# Patient Record
Sex: Female | Born: 1935 | Race: White | Hispanic: No | Smoking: Never smoker
Health system: Southern US, Community
[De-identification: ages and names within clinical notes are randomized; demographics above are authoritative.]

## PROBLEM LIST (undated history)

## (undated) DIAGNOSIS — I350 Nonrheumatic aortic (valve) stenosis: Secondary | ICD-10-CM

## (undated) DIAGNOSIS — N185 Chronic kidney disease, stage 5: Secondary | ICD-10-CM

## (undated) DIAGNOSIS — J449 Chronic obstructive pulmonary disease, unspecified: Secondary | ICD-10-CM

## (undated) DIAGNOSIS — J441 Chronic obstructive pulmonary disease with (acute) exacerbation: Secondary | ICD-10-CM

## (undated) DIAGNOSIS — R011 Cardiac murmur, unspecified: Secondary | ICD-10-CM

## (undated) DIAGNOSIS — I5031 Acute diastolic (congestive) heart failure: Secondary | ICD-10-CM

## (undated) DIAGNOSIS — E669 Obesity, unspecified: Secondary | ICD-10-CM

## (undated) DIAGNOSIS — I10 Essential (primary) hypertension: Secondary | ICD-10-CM

## (undated) DIAGNOSIS — G4733 Obstructive sleep apnea (adult) (pediatric): Secondary | ICD-10-CM

## (undated) DIAGNOSIS — I5021 Acute systolic (congestive) heart failure: Secondary | ICD-10-CM

## (undated) DIAGNOSIS — R0600 Dyspnea, unspecified: Secondary | ICD-10-CM

## (undated) DIAGNOSIS — E8770 Fluid overload, unspecified: Secondary | ICD-10-CM

## (undated) DIAGNOSIS — E119 Type 2 diabetes mellitus without complications: Secondary | ICD-10-CM

## (undated) DIAGNOSIS — I509 Heart failure, unspecified: Secondary | ICD-10-CM

## (undated) DIAGNOSIS — E785 Hyperlipidemia, unspecified: Secondary | ICD-10-CM

## (undated) HISTORY — DX: Acute systolic (congestive) heart failure: I50.21

## (undated) HISTORY — DX: Acute diastolic (congestive) heart failure: I50.31

## (undated) HISTORY — DX: Obesity, unspecified: E66.9

## (undated) HISTORY — DX: Heart failure, unspecified: I50.9

## (undated) HISTORY — DX: Chronic kidney disease, stage 5: N18.5

## (undated) HISTORY — PX: EYE SURGERY: SHX253

## (undated) HISTORY — DX: Hyperlipidemia, unspecified: E78.5

## (undated) HISTORY — DX: Chronic obstructive pulmonary disease with (acute) exacerbation: J44.1

## (undated) HISTORY — DX: Type 2 diabetes mellitus without complications: E11.9

## (undated) HISTORY — DX: Dyspnea, unspecified: R06.00

## (undated) HISTORY — PX: CATARACT EXTRACTION: SUR2

## (undated) HISTORY — DX: Fluid overload, unspecified: E87.70

## (undated) HISTORY — DX: Essential (primary) hypertension: I10

## (undated) HISTORY — DX: Obstructive sleep apnea (adult) (pediatric): G47.33

## (undated) HISTORY — DX: Chronic obstructive pulmonary disease, unspecified: J44.9

## (undated) HISTORY — PX: TONSILLECTOMY: SUR1361

## (undated) HISTORY — DX: Nonrheumatic aortic (valve) stenosis: I35.0

---

## 2013-12-29 DIAGNOSIS — G4733 Obstructive sleep apnea (adult) (pediatric): Secondary | ICD-10-CM | POA: Diagnosis not present

## 2014-02-17 DIAGNOSIS — J449 Chronic obstructive pulmonary disease, unspecified: Secondary | ICD-10-CM | POA: Diagnosis not present

## 2014-02-17 DIAGNOSIS — I10 Essential (primary) hypertension: Secondary | ICD-10-CM | POA: Diagnosis not present

## 2014-02-17 DIAGNOSIS — R5383 Other fatigue: Secondary | ICD-10-CM | POA: Diagnosis not present

## 2014-02-17 DIAGNOSIS — E785 Hyperlipidemia, unspecified: Secondary | ICD-10-CM | POA: Diagnosis not present

## 2014-02-17 DIAGNOSIS — Z79899 Other long term (current) drug therapy: Secondary | ICD-10-CM | POA: Diagnosis not present

## 2014-02-17 DIAGNOSIS — E119 Type 2 diabetes mellitus without complications: Secondary | ICD-10-CM | POA: Diagnosis not present

## 2014-02-17 DIAGNOSIS — R5381 Other malaise: Secondary | ICD-10-CM | POA: Diagnosis not present

## 2014-02-27 DIAGNOSIS — R197 Diarrhea, unspecified: Secondary | ICD-10-CM | POA: Diagnosis not present

## 2014-03-03 DIAGNOSIS — Z794 Long term (current) use of insulin: Secondary | ICD-10-CM | POA: Diagnosis not present

## 2014-03-03 DIAGNOSIS — N2889 Other specified disorders of kidney and ureter: Secondary | ICD-10-CM | POA: Diagnosis not present

## 2014-03-03 DIAGNOSIS — Z7982 Long term (current) use of aspirin: Secondary | ICD-10-CM | POA: Diagnosis not present

## 2014-03-03 DIAGNOSIS — I10 Essential (primary) hypertension: Secondary | ICD-10-CM | POA: Diagnosis not present

## 2014-03-03 DIAGNOSIS — R197 Diarrhea, unspecified: Secondary | ICD-10-CM | POA: Diagnosis not present

## 2014-03-03 DIAGNOSIS — K828 Other specified diseases of gallbladder: Secondary | ICD-10-CM | POA: Diagnosis not present

## 2014-03-03 DIAGNOSIS — E119 Type 2 diabetes mellitus without complications: Secondary | ICD-10-CM | POA: Diagnosis not present

## 2014-03-03 DIAGNOSIS — K5730 Diverticulosis of large intestine without perforation or abscess without bleeding: Secondary | ICD-10-CM | POA: Diagnosis not present

## 2014-03-06 DIAGNOSIS — N184 Chronic kidney disease, stage 4 (severe): Secondary | ICD-10-CM | POA: Diagnosis not present

## 2014-03-06 DIAGNOSIS — J45909 Unspecified asthma, uncomplicated: Secondary | ICD-10-CM | POA: Diagnosis not present

## 2014-03-06 DIAGNOSIS — R197 Diarrhea, unspecified: Secondary | ICD-10-CM | POA: Diagnosis not present

## 2014-03-06 DIAGNOSIS — E119 Type 2 diabetes mellitus without complications: Secondary | ICD-10-CM | POA: Diagnosis not present

## 2014-03-09 DIAGNOSIS — R197 Diarrhea, unspecified: Secondary | ICD-10-CM | POA: Diagnosis not present

## 2014-03-10 DIAGNOSIS — R197 Diarrhea, unspecified: Secondary | ICD-10-CM | POA: Diagnosis not present

## 2014-04-14 DIAGNOSIS — I509 Heart failure, unspecified: Secondary | ICD-10-CM | POA: Diagnosis not present

## 2014-08-18 DIAGNOSIS — J449 Chronic obstructive pulmonary disease, unspecified: Secondary | ICD-10-CM | POA: Diagnosis not present

## 2014-08-18 DIAGNOSIS — I10 Essential (primary) hypertension: Secondary | ICD-10-CM | POA: Diagnosis not present

## 2014-08-18 DIAGNOSIS — R197 Diarrhea, unspecified: Secondary | ICD-10-CM | POA: Diagnosis not present

## 2014-08-18 DIAGNOSIS — E119 Type 2 diabetes mellitus without complications: Secondary | ICD-10-CM | POA: Diagnosis not present

## 2014-08-18 DIAGNOSIS — E785 Hyperlipidemia, unspecified: Secondary | ICD-10-CM | POA: Diagnosis not present

## 2014-08-22 DIAGNOSIS — R5381 Other malaise: Secondary | ICD-10-CM | POA: Diagnosis not present

## 2014-08-22 DIAGNOSIS — R5383 Other fatigue: Secondary | ICD-10-CM | POA: Diagnosis not present

## 2014-08-22 DIAGNOSIS — E119 Type 2 diabetes mellitus without complications: Secondary | ICD-10-CM | POA: Diagnosis not present

## 2014-08-22 DIAGNOSIS — I10 Essential (primary) hypertension: Secondary | ICD-10-CM | POA: Diagnosis not present

## 2014-08-22 DIAGNOSIS — E785 Hyperlipidemia, unspecified: Secondary | ICD-10-CM | POA: Diagnosis not present

## 2015-02-27 DIAGNOSIS — G4733 Obstructive sleep apnea (adult) (pediatric): Secondary | ICD-10-CM | POA: Diagnosis not present

## 2015-03-15 DIAGNOSIS — I509 Heart failure, unspecified: Secondary | ICD-10-CM | POA: Diagnosis not present

## 2015-03-15 DIAGNOSIS — R531 Weakness: Secondary | ICD-10-CM | POA: Diagnosis not present

## 2015-03-15 DIAGNOSIS — I10 Essential (primary) hypertension: Secondary | ICD-10-CM | POA: Diagnosis not present

## 2015-03-15 DIAGNOSIS — R0602 Shortness of breath: Secondary | ICD-10-CM | POA: Diagnosis not present

## 2015-03-15 DIAGNOSIS — J40 Bronchitis, not specified as acute or chronic: Secondary | ICD-10-CM | POA: Diagnosis not present

## 2015-03-20 DIAGNOSIS — I10 Essential (primary) hypertension: Secondary | ICD-10-CM | POA: Diagnosis not present

## 2015-03-20 DIAGNOSIS — J209 Acute bronchitis, unspecified: Secondary | ICD-10-CM | POA: Diagnosis not present

## 2015-03-20 DIAGNOSIS — E119 Type 2 diabetes mellitus without complications: Secondary | ICD-10-CM | POA: Diagnosis not present

## 2015-03-20 DIAGNOSIS — I359 Nonrheumatic aortic valve disorder, unspecified: Secondary | ICD-10-CM | POA: Diagnosis not present

## 2015-03-20 DIAGNOSIS — I509 Heart failure, unspecified: Secondary | ICD-10-CM | POA: Diagnosis not present

## 2015-03-21 DIAGNOSIS — I10 Essential (primary) hypertension: Secondary | ICD-10-CM | POA: Diagnosis not present

## 2015-03-21 DIAGNOSIS — I359 Nonrheumatic aortic valve disorder, unspecified: Secondary | ICD-10-CM | POA: Diagnosis not present

## 2015-04-16 DIAGNOSIS — I349 Nonrheumatic mitral valve disorder, unspecified: Secondary | ICD-10-CM | POA: Diagnosis not present

## 2015-04-16 DIAGNOSIS — I359 Nonrheumatic aortic valve disorder, unspecified: Secondary | ICD-10-CM | POA: Diagnosis not present

## 2015-04-16 DIAGNOSIS — I5032 Chronic diastolic (congestive) heart failure: Secondary | ICD-10-CM | POA: Diagnosis not present

## 2015-04-16 DIAGNOSIS — I10 Essential (primary) hypertension: Secondary | ICD-10-CM | POA: Diagnosis not present

## 2015-04-16 DIAGNOSIS — Z7722 Contact with and (suspected) exposure to environmental tobacco smoke (acute) (chronic): Secondary | ICD-10-CM | POA: Diagnosis not present

## 2015-04-20 DIAGNOSIS — J45998 Other asthma: Secondary | ICD-10-CM | POA: Diagnosis not present

## 2015-04-20 DIAGNOSIS — E119 Type 2 diabetes mellitus without complications: Secondary | ICD-10-CM | POA: Diagnosis not present

## 2015-04-20 DIAGNOSIS — E784 Other hyperlipidemia: Secondary | ICD-10-CM | POA: Diagnosis not present

## 2015-04-20 DIAGNOSIS — I10 Essential (primary) hypertension: Secondary | ICD-10-CM | POA: Diagnosis not present

## 2015-06-05 DIAGNOSIS — H34832 Tributary (branch) retinal vein occlusion, left eye: Secondary | ICD-10-CM | POA: Diagnosis not present

## 2015-06-05 DIAGNOSIS — H4011X1 Primary open-angle glaucoma, mild stage: Secondary | ICD-10-CM | POA: Diagnosis not present

## 2015-06-05 DIAGNOSIS — E11339 Type 2 diabetes mellitus with moderate nonproliferative diabetic retinopathy without macular edema: Secondary | ICD-10-CM | POA: Diagnosis not present

## 2015-06-05 DIAGNOSIS — Z961 Presence of intraocular lens: Secondary | ICD-10-CM | POA: Diagnosis not present

## 2015-06-12 DIAGNOSIS — Z7722 Contact with and (suspected) exposure to environmental tobacco smoke (acute) (chronic): Secondary | ICD-10-CM | POA: Diagnosis not present

## 2015-06-12 DIAGNOSIS — I10 Essential (primary) hypertension: Secondary | ICD-10-CM | POA: Diagnosis not present

## 2015-07-04 DIAGNOSIS — H3509 Other intraretinal microvascular abnormalities: Secondary | ICD-10-CM | POA: Diagnosis not present

## 2015-07-04 DIAGNOSIS — E11331 Type 2 diabetes mellitus with moderate nonproliferative diabetic retinopathy with macular edema: Secondary | ICD-10-CM | POA: Diagnosis not present

## 2015-07-04 DIAGNOSIS — H35033 Hypertensive retinopathy, bilateral: Secondary | ICD-10-CM | POA: Diagnosis not present

## 2015-07-04 DIAGNOSIS — E11339 Type 2 diabetes mellitus with moderate nonproliferative diabetic retinopathy without macular edema: Secondary | ICD-10-CM | POA: Diagnosis not present

## 2015-07-04 DIAGNOSIS — D3132 Benign neoplasm of left choroid: Secondary | ICD-10-CM | POA: Diagnosis not present

## 2015-07-09 DIAGNOSIS — I10 Essential (primary) hypertension: Secondary | ICD-10-CM | POA: Diagnosis not present

## 2015-07-09 DIAGNOSIS — Z7722 Contact with and (suspected) exposure to environmental tobacco smoke (acute) (chronic): Secondary | ICD-10-CM | POA: Diagnosis not present

## 2015-07-18 DIAGNOSIS — Z7722 Contact with and (suspected) exposure to environmental tobacco smoke (acute) (chronic): Secondary | ICD-10-CM | POA: Diagnosis not present

## 2015-07-18 DIAGNOSIS — I10 Essential (primary) hypertension: Secondary | ICD-10-CM | POA: Diagnosis not present

## 2015-07-18 DIAGNOSIS — I5032 Chronic diastolic (congestive) heart failure: Secondary | ICD-10-CM | POA: Diagnosis not present

## 2015-07-18 DIAGNOSIS — I349 Nonrheumatic mitral valve disorder, unspecified: Secondary | ICD-10-CM | POA: Diagnosis not present

## 2015-07-18 DIAGNOSIS — I359 Nonrheumatic aortic valve disorder, unspecified: Secondary | ICD-10-CM | POA: Diagnosis not present

## 2015-08-01 DIAGNOSIS — E11331 Type 2 diabetes mellitus with moderate nonproliferative diabetic retinopathy with macular edema: Secondary | ICD-10-CM | POA: Diagnosis not present

## 2015-08-29 DIAGNOSIS — E11331 Type 2 diabetes mellitus with moderate nonproliferative diabetic retinopathy with macular edema: Secondary | ICD-10-CM | POA: Diagnosis not present

## 2015-09-25 DIAGNOSIS — Z111 Encounter for screening for respiratory tuberculosis: Secondary | ICD-10-CM | POA: Diagnosis not present

## 2015-10-15 DIAGNOSIS — H409 Unspecified glaucoma: Secondary | ICD-10-CM | POA: Diagnosis not present

## 2015-10-15 DIAGNOSIS — H349 Unspecified retinal vascular occlusion: Secondary | ICD-10-CM | POA: Diagnosis not present

## 2015-10-15 DIAGNOSIS — E785 Hyperlipidemia, unspecified: Secondary | ICD-10-CM | POA: Diagnosis not present

## 2015-10-15 DIAGNOSIS — Z8639 Personal history of other endocrine, nutritional and metabolic disease: Secondary | ICD-10-CM | POA: Diagnosis not present

## 2015-10-15 DIAGNOSIS — R011 Cardiac murmur, unspecified: Secondary | ICD-10-CM | POA: Diagnosis not present

## 2015-10-15 DIAGNOSIS — G4733 Obstructive sleep apnea (adult) (pediatric): Secondary | ICD-10-CM | POA: Diagnosis not present

## 2015-10-15 DIAGNOSIS — N184 Chronic kidney disease, stage 4 (severe): Secondary | ICD-10-CM | POA: Diagnosis not present

## 2015-10-15 DIAGNOSIS — I10 Essential (primary) hypertension: Secondary | ICD-10-CM | POA: Diagnosis not present

## 2015-10-16 DIAGNOSIS — R29818 Other symptoms and signs involving the nervous system: Secondary | ICD-10-CM | POA: Diagnosis not present

## 2015-10-16 DIAGNOSIS — R2681 Unsteadiness on feet: Secondary | ICD-10-CM | POA: Diagnosis not present

## 2015-10-16 DIAGNOSIS — R29898 Other symptoms and signs involving the musculoskeletal system: Secondary | ICD-10-CM | POA: Diagnosis not present

## 2015-10-16 DIAGNOSIS — M6281 Muscle weakness (generalized): Secondary | ICD-10-CM | POA: Diagnosis not present

## 2015-10-17 DIAGNOSIS — R2681 Unsteadiness on feet: Secondary | ICD-10-CM | POA: Diagnosis not present

## 2015-10-17 DIAGNOSIS — R29898 Other symptoms and signs involving the musculoskeletal system: Secondary | ICD-10-CM | POA: Diagnosis not present

## 2015-10-17 DIAGNOSIS — M6281 Muscle weakness (generalized): Secondary | ICD-10-CM | POA: Diagnosis not present

## 2015-10-17 DIAGNOSIS — R29818 Other symptoms and signs involving the nervous system: Secondary | ICD-10-CM | POA: Diagnosis not present

## 2015-10-18 ENCOUNTER — Ambulatory Visit (INDEPENDENT_AMBULATORY_CARE_PROVIDER_SITE_OTHER): Payer: Medicare Other | Admitting: Pulmonary Disease

## 2015-10-18 ENCOUNTER — Encounter: Payer: Self-pay | Admitting: Pulmonary Disease

## 2015-10-18 VITALS — BP 138/72 | HR 53 | Ht 62.0 in | Wt 208.6 lb

## 2015-10-18 DIAGNOSIS — J439 Emphysema, unspecified: Secondary | ICD-10-CM | POA: Diagnosis not present

## 2015-10-18 DIAGNOSIS — R29818 Other symptoms and signs involving the nervous system: Secondary | ICD-10-CM | POA: Diagnosis not present

## 2015-10-18 DIAGNOSIS — M6281 Muscle weakness (generalized): Secondary | ICD-10-CM | POA: Diagnosis not present

## 2015-10-18 DIAGNOSIS — R29898 Other symptoms and signs involving the musculoskeletal system: Secondary | ICD-10-CM | POA: Diagnosis not present

## 2015-10-18 DIAGNOSIS — R2681 Unsteadiness on feet: Secondary | ICD-10-CM | POA: Diagnosis not present

## 2015-10-18 NOTE — Progress Notes (Addendum)
   Subjective:    Patient ID: Kathryn Peck, female    DOB: March 28, 1928, 81 y.o.   MRN: [id number]  HPI New consult for COPD, sleep apnea.  Kathryn Peck is a 81 year old with history of hypertension, hyperlipidemia, chronic kidney disease, COPD, sleep apnea. She recently moved here from [place] into [facility] and is looking to transfer care here. She has a diagnosis of COPD but is a never smoker. She is on Spiriva. She has some dyspnea on exertion but it does not appear to bother her much. She denies any cough, sputum production, wheezing, hemoptysis.   She also has a diagnosis of OSA and is on CPAP for the past 4-5 years. She does not know what pressure settings she is on. She is on nocturnal oxygen via CPAP at 2 LPM.  PMH: Hypertension Heart murmur Sleep apnea COPD Glaucoma Hyperlipidemia Chronic kidney disease stage IV History of diabetes mellitus, now resolved. Retinal vein occlusion.  Meds: Aspirin Bumex Isosorbide mononitrate Simvastatin Spiriva Mirtazapine Potassium chloride Vitamin B12 Metoprolol succinate ER Lab the process Probiotic, biotin, MVI Spiriva  Social history: She is a never smoker, no alcohol use, no recreational drug use she is widowed. 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 There is no exposures at work or at home.   Review of Systems Has dyspnea on exertion. Denies any cough, sputum production, fevers, chills, hemoptysis. Denies any chest pain, palpitations. Denies any fevers, chills, malaise, loss of weight or appetite. All other review of systems are negative.    Objective:   Physical Exam Blood pressure 138/72, pulse 53, height 5\' 2"  (1.575 m), weight 208 lb 9.6 oz (94.62 kg), SpO2 99 %. Gen.: Pleasant elderly female. No apparent distress Neuro: No gross focal deficits. Neck: No JVD, lymphadenopathy, thyromegaly. RS: Clear. No wheeze or crackles. Nonlabored breathing. CVS: S1-S2  heard, no murmurs rubs gallops. Abdomen: Soft, positive bowel sounds. Extremities: No edema.    Assessment & Plan:  #1 COPD. Is unclear if she really has emphysema. She is a lifelong nonsmoker with no known exposures. I'll try to obtain records from her PCP about any lung function tests in the past. She will get baseline PFTs in our office today have asked her to continue the Spiriva for now until we have more information.  #2 OSA She is on CPAP with 2 L oxygen at night. The sleep study was done in [place] for 5 years ago. We'll try to obtain these records. If unable to do so then she may need to get started on his AutoSet CPAP or repeat the CPAP titration here.  Marshell Garfinkel MD [facility] Pager [phone] If no answer or after 3pm call: [phone] 10/18/2015, 5:04 PM   Addendum: ONO: 11/20/15-shows desaturations. Patient already scheduled for a split-night study in January 2017.

## 2015-10-18 NOTE — Patient Instructions (Signed)
We'll schedule for lung function tests. Will order home oxygen 2 L at night through the CPAP. We'll try to get records about your sleep study and CPAP from Dr. Radene Ou.  Return to clinic in 3 months.

## 2015-10-19 DIAGNOSIS — R2681 Unsteadiness on feet: Secondary | ICD-10-CM | POA: Diagnosis not present

## 2015-10-19 DIAGNOSIS — M6281 Muscle weakness (generalized): Secondary | ICD-10-CM | POA: Diagnosis not present

## 2015-10-19 DIAGNOSIS — R29818 Other symptoms and signs involving the nervous system: Secondary | ICD-10-CM | POA: Diagnosis not present

## 2015-10-19 DIAGNOSIS — R29898 Other symptoms and signs involving the musculoskeletal system: Secondary | ICD-10-CM | POA: Diagnosis not present

## 2015-10-22 DIAGNOSIS — R29898 Other symptoms and signs involving the musculoskeletal system: Secondary | ICD-10-CM | POA: Diagnosis not present

## 2015-10-22 DIAGNOSIS — R2681 Unsteadiness on feet: Secondary | ICD-10-CM | POA: Diagnosis not present

## 2015-10-22 DIAGNOSIS — M6281 Muscle weakness (generalized): Secondary | ICD-10-CM | POA: Diagnosis not present

## 2015-10-22 DIAGNOSIS — R29818 Other symptoms and signs involving the nervous system: Secondary | ICD-10-CM | POA: Diagnosis not present

## 2015-10-23 DIAGNOSIS — R29818 Other symptoms and signs involving the nervous system: Secondary | ICD-10-CM | POA: Diagnosis not present

## 2015-10-23 DIAGNOSIS — R2681 Unsteadiness on feet: Secondary | ICD-10-CM | POA: Diagnosis not present

## 2015-10-23 DIAGNOSIS — M6281 Muscle weakness (generalized): Secondary | ICD-10-CM | POA: Diagnosis not present

## 2015-10-23 DIAGNOSIS — R29898 Other symptoms and signs involving the musculoskeletal system: Secondary | ICD-10-CM | POA: Diagnosis not present

## 2015-10-24 DIAGNOSIS — R2681 Unsteadiness on feet: Secondary | ICD-10-CM | POA: Diagnosis not present

## 2015-10-24 DIAGNOSIS — M6281 Muscle weakness (generalized): Secondary | ICD-10-CM | POA: Diagnosis not present

## 2015-10-24 DIAGNOSIS — R29898 Other symptoms and signs involving the musculoskeletal system: Secondary | ICD-10-CM | POA: Diagnosis not present

## 2015-10-24 DIAGNOSIS — R29818 Other symptoms and signs involving the nervous system: Secondary | ICD-10-CM | POA: Diagnosis not present

## 2015-10-29 DIAGNOSIS — R29898 Other symptoms and signs involving the musculoskeletal system: Secondary | ICD-10-CM | POA: Diagnosis not present

## 2015-10-29 DIAGNOSIS — M6281 Muscle weakness (generalized): Secondary | ICD-10-CM | POA: Diagnosis not present

## 2015-10-29 DIAGNOSIS — R29818 Other symptoms and signs involving the nervous system: Secondary | ICD-10-CM | POA: Diagnosis not present

## 2015-10-29 DIAGNOSIS — R2681 Unsteadiness on feet: Secondary | ICD-10-CM | POA: Diagnosis not present

## 2015-10-30 ENCOUNTER — Telehealth: Payer: Self-pay | Admitting: Pulmonary Disease

## 2015-10-30 DIAGNOSIS — G4733 Obstructive sleep apnea (adult) (pediatric): Secondary | ICD-10-CM

## 2015-10-30 NOTE — Telephone Encounter (Signed)
Spoke with Corene Cornea w/ [facility] who reports that pt is currently on O2 from [place] that was ordered via ONO results.  Corene Cornea checked with Medicare and pt was reportedly not tested correctly in [place] because with an OSA diagnosis, pt MUST have a sleep study or qualifying walk to qualify for O2.  Patient currently has a concentrator from [place] that needs to be shipped back [facility] cannot provide O2 if they do not provide the equipment so best course of action would be to go ahead and order a new sleep study so that pt can be qualified for oxygen and determine CPAP settings.  CPAP supplies will not be an issue with a current rx.  Corene Cornea is requesting a call back if/when the sleep study is scheduled @ [phone].  Corene Cornea reports he has already spoken with pt's daughter.  Per the 10.31.16 ov w/ PM: #2 OSA She is on CPAP with 2 L oxygen at night. The sleep study was done in [place] for 5 years ago. We'll try to obtain these records. If unable to do so then she may need to get started on his AutoSet CPAP or repeat the CPAP titration here.   Dr Vaughan Browner, may we go ahead and order a sleep study for pt?  Thanks.

## 2015-10-30 NOTE — Telephone Encounter (Signed)
Spoke with Morey Hummingbird at [facility] professional-states that pt's sleep study was done under [facility] in 2006-can fax this to [place] but needs a new ROI to fill this out.  Morey Hummingbird had spoken to pt's daughter and ROI has been sent for her to fill out.  Morey Hummingbird states she expects this form back from pt's daughter within the next day.  This will be faxed to our office (verified fax #) to [place]' attn.    Forwarding to Jess to look out for.

## 2015-10-30 NOTE — Telephone Encounter (Signed)
Patient's daughter returned call, may be reached at [phone].

## 2015-10-30 NOTE — Telephone Encounter (Signed)
Spoke with pt's daughter Marliss Coots, calling to see if we've received pt's sleep study.  I advised that I had just spoken with Morey Hummingbird at [facility], who had just sent an updated ROI for them to send our office the sleep study.  Pt's daughter states she has just received the ROI and is filling it out currently for [facility]. This is being followed up through another phone note.  Will close phone message.

## 2015-10-30 NOTE — Telephone Encounter (Signed)
lmtcb X1 for pt's daughter 

## 2015-10-31 DIAGNOSIS — M6281 Muscle weakness (generalized): Secondary | ICD-10-CM | POA: Diagnosis not present

## 2015-10-31 DIAGNOSIS — R29818 Other symptoms and signs involving the nervous system: Secondary | ICD-10-CM | POA: Diagnosis not present

## 2015-10-31 DIAGNOSIS — R29898 Other symptoms and signs involving the musculoskeletal system: Secondary | ICD-10-CM | POA: Diagnosis not present

## 2015-10-31 DIAGNOSIS — R2681 Unsteadiness on feet: Secondary | ICD-10-CM | POA: Diagnosis not present

## 2015-10-31 NOTE — Telephone Encounter (Signed)
Awaiting Dr. Mannam's response.  

## 2015-10-31 NOTE — Telephone Encounter (Signed)
lmomtcb for pt - after speaking with pt to confirm she is aware of below, will place order.

## 2015-10-31 NOTE — Telephone Encounter (Signed)
Spoke with pt daughter Marliss Coots - aware that we are placing order for Split Night Study. Nothing further needed.

## 2015-10-31 NOTE — Addendum Note (Signed)
Addended by: Virl Cagey on: 10/31/2015 03:17 PM   Modules accepted: Orders

## 2015-10-31 NOTE — Telephone Encounter (Signed)
Yes. Ok to order split night sleep study 

## 2015-10-31 NOTE — Telephone Encounter (Signed)
Jess, have you received this?

## 2015-11-01 DIAGNOSIS — M6281 Muscle weakness (generalized): Secondary | ICD-10-CM | POA: Diagnosis not present

## 2015-11-01 DIAGNOSIS — R29898 Other symptoms and signs involving the musculoskeletal system: Secondary | ICD-10-CM | POA: Diagnosis not present

## 2015-11-01 DIAGNOSIS — R2681 Unsteadiness on feet: Secondary | ICD-10-CM | POA: Diagnosis not present

## 2015-11-01 DIAGNOSIS — R29818 Other symptoms and signs involving the nervous system: Secondary | ICD-10-CM | POA: Diagnosis not present

## 2015-11-01 NOTE — Telephone Encounter (Signed)
Yes, I have received this and per the 3rd 11.15.16 phone note it is no longer needed as insurance requires a new sleep study Phone note closed.

## 2015-11-02 DIAGNOSIS — I10 Essential (primary) hypertension: Secondary | ICD-10-CM | POA: Diagnosis not present

## 2015-11-02 DIAGNOSIS — R29898 Other symptoms and signs involving the musculoskeletal system: Secondary | ICD-10-CM | POA: Diagnosis not present

## 2015-11-02 DIAGNOSIS — Z79899 Other long term (current) drug therapy: Secondary | ICD-10-CM | POA: Diagnosis not present

## 2015-11-02 DIAGNOSIS — M6281 Muscle weakness (generalized): Secondary | ICD-10-CM | POA: Diagnosis not present

## 2015-11-02 DIAGNOSIS — R29818 Other symptoms and signs involving the nervous system: Secondary | ICD-10-CM | POA: Diagnosis not present

## 2015-11-02 DIAGNOSIS — R2681 Unsteadiness on feet: Secondary | ICD-10-CM | POA: Diagnosis not present

## 2015-11-02 DIAGNOSIS — E785 Hyperlipidemia, unspecified: Secondary | ICD-10-CM | POA: Diagnosis not present

## 2015-11-06 DIAGNOSIS — M6281 Muscle weakness (generalized): Secondary | ICD-10-CM | POA: Diagnosis not present

## 2015-11-06 DIAGNOSIS — R29898 Other symptoms and signs involving the musculoskeletal system: Secondary | ICD-10-CM | POA: Diagnosis not present

## 2015-11-06 DIAGNOSIS — R2681 Unsteadiness on feet: Secondary | ICD-10-CM | POA: Diagnosis not present

## 2015-11-06 DIAGNOSIS — R29818 Other symptoms and signs involving the nervous system: Secondary | ICD-10-CM | POA: Diagnosis not present

## 2015-11-07 DIAGNOSIS — R29818 Other symptoms and signs involving the nervous system: Secondary | ICD-10-CM | POA: Diagnosis not present

## 2015-11-07 DIAGNOSIS — M6281 Muscle weakness (generalized): Secondary | ICD-10-CM | POA: Diagnosis not present

## 2015-11-07 DIAGNOSIS — R2681 Unsteadiness on feet: Secondary | ICD-10-CM | POA: Diagnosis not present

## 2015-11-07 DIAGNOSIS — R29898 Other symptoms and signs involving the musculoskeletal system: Secondary | ICD-10-CM | POA: Diagnosis not present

## 2015-11-09 DIAGNOSIS — M6281 Muscle weakness (generalized): Secondary | ICD-10-CM | POA: Diagnosis not present

## 2015-11-09 DIAGNOSIS — R29818 Other symptoms and signs involving the nervous system: Secondary | ICD-10-CM | POA: Diagnosis not present

## 2015-11-09 DIAGNOSIS — R29898 Other symptoms and signs involving the musculoskeletal system: Secondary | ICD-10-CM | POA: Diagnosis not present

## 2015-11-09 DIAGNOSIS — R2681 Unsteadiness on feet: Secondary | ICD-10-CM | POA: Diagnosis not present

## 2015-11-12 ENCOUNTER — Encounter (HOSPITAL_BASED_OUTPATIENT_CLINIC_OR_DEPARTMENT_OTHER): Payer: Medicare Other

## 2015-11-12 DIAGNOSIS — M6281 Muscle weakness (generalized): Secondary | ICD-10-CM | POA: Diagnosis not present

## 2015-11-12 DIAGNOSIS — R29898 Other symptoms and signs involving the musculoskeletal system: Secondary | ICD-10-CM | POA: Diagnosis not present

## 2015-11-12 DIAGNOSIS — R2681 Unsteadiness on feet: Secondary | ICD-10-CM | POA: Diagnosis not present

## 2015-11-12 DIAGNOSIS — R29818 Other symptoms and signs involving the nervous system: Secondary | ICD-10-CM | POA: Diagnosis not present

## 2015-11-14 DIAGNOSIS — M6281 Muscle weakness (generalized): Secondary | ICD-10-CM | POA: Diagnosis not present

## 2015-11-14 DIAGNOSIS — R29818 Other symptoms and signs involving the nervous system: Secondary | ICD-10-CM | POA: Diagnosis not present

## 2015-11-14 DIAGNOSIS — R2681 Unsteadiness on feet: Secondary | ICD-10-CM | POA: Diagnosis not present

## 2015-11-14 DIAGNOSIS — R29898 Other symptoms and signs involving the musculoskeletal system: Secondary | ICD-10-CM | POA: Diagnosis not present

## 2015-11-16 DIAGNOSIS — R29818 Other symptoms and signs involving the nervous system: Secondary | ICD-10-CM | POA: Diagnosis not present

## 2015-11-16 DIAGNOSIS — R2681 Unsteadiness on feet: Secondary | ICD-10-CM | POA: Diagnosis not present

## 2015-11-16 DIAGNOSIS — R29898 Other symptoms and signs involving the musculoskeletal system: Secondary | ICD-10-CM | POA: Diagnosis not present

## 2015-11-16 DIAGNOSIS — M6281 Muscle weakness (generalized): Secondary | ICD-10-CM | POA: Diagnosis not present

## 2015-11-19 DIAGNOSIS — F329 Major depressive disorder, single episode, unspecified: Secondary | ICD-10-CM | POA: Diagnosis not present

## 2015-11-19 DIAGNOSIS — R29818 Other symptoms and signs involving the nervous system: Secondary | ICD-10-CM | POA: Diagnosis not present

## 2015-11-19 DIAGNOSIS — R2681 Unsteadiness on feet: Secondary | ICD-10-CM | POA: Diagnosis not present

## 2015-11-19 DIAGNOSIS — M6281 Muscle weakness (generalized): Secondary | ICD-10-CM | POA: Diagnosis not present

## 2015-11-19 DIAGNOSIS — R29898 Other symptoms and signs involving the musculoskeletal system: Secondary | ICD-10-CM | POA: Diagnosis not present

## 2015-11-19 DIAGNOSIS — R197 Diarrhea, unspecified: Secondary | ICD-10-CM | POA: Diagnosis not present

## 2015-11-19 DIAGNOSIS — M25531 Pain in right wrist: Secondary | ICD-10-CM | POA: Diagnosis not present

## 2015-11-20 ENCOUNTER — Encounter: Payer: Self-pay | Admitting: Pulmonary Disease

## 2015-11-20 DIAGNOSIS — R2681 Unsteadiness on feet: Secondary | ICD-10-CM | POA: Diagnosis not present

## 2015-11-20 DIAGNOSIS — R29818 Other symptoms and signs involving the nervous system: Secondary | ICD-10-CM | POA: Diagnosis not present

## 2015-11-20 DIAGNOSIS — M6281 Muscle weakness (generalized): Secondary | ICD-10-CM | POA: Diagnosis not present

## 2015-11-20 DIAGNOSIS — M25539 Pain in unspecified wrist: Secondary | ICD-10-CM | POA: Diagnosis not present

## 2015-11-20 DIAGNOSIS — R197 Diarrhea, unspecified: Secondary | ICD-10-CM | POA: Diagnosis not present

## 2015-11-20 DIAGNOSIS — R29898 Other symptoms and signs involving the musculoskeletal system: Secondary | ICD-10-CM | POA: Diagnosis not present

## 2015-11-21 DIAGNOSIS — R29818 Other symptoms and signs involving the nervous system: Secondary | ICD-10-CM | POA: Diagnosis not present

## 2015-11-21 DIAGNOSIS — R29898 Other symptoms and signs involving the musculoskeletal system: Secondary | ICD-10-CM | POA: Diagnosis not present

## 2015-11-21 DIAGNOSIS — M6281 Muscle weakness (generalized): Secondary | ICD-10-CM | POA: Diagnosis not present

## 2015-11-21 DIAGNOSIS — R2681 Unsteadiness on feet: Secondary | ICD-10-CM | POA: Diagnosis not present

## 2015-11-22 DIAGNOSIS — R197 Diarrhea, unspecified: Secondary | ICD-10-CM | POA: Diagnosis not present

## 2015-11-22 DIAGNOSIS — R29818 Other symptoms and signs involving the nervous system: Secondary | ICD-10-CM | POA: Diagnosis not present

## 2015-11-22 DIAGNOSIS — I10 Essential (primary) hypertension: Secondary | ICD-10-CM | POA: Diagnosis not present

## 2015-11-22 DIAGNOSIS — D649 Anemia, unspecified: Secondary | ICD-10-CM | POA: Diagnosis not present

## 2015-11-22 DIAGNOSIS — R29898 Other symptoms and signs involving the musculoskeletal system: Secondary | ICD-10-CM | POA: Diagnosis not present

## 2015-11-22 DIAGNOSIS — M25539 Pain in unspecified wrist: Secondary | ICD-10-CM | POA: Diagnosis not present

## 2015-11-22 DIAGNOSIS — R2681 Unsteadiness on feet: Secondary | ICD-10-CM | POA: Diagnosis not present

## 2015-11-22 DIAGNOSIS — D509 Iron deficiency anemia, unspecified: Secondary | ICD-10-CM | POA: Diagnosis not present

## 2015-11-22 DIAGNOSIS — M6281 Muscle weakness (generalized): Secondary | ICD-10-CM | POA: Diagnosis not present

## 2015-11-26 DIAGNOSIS — R29898 Other symptoms and signs involving the musculoskeletal system: Secondary | ICD-10-CM | POA: Diagnosis not present

## 2015-11-26 DIAGNOSIS — R2681 Unsteadiness on feet: Secondary | ICD-10-CM | POA: Diagnosis not present

## 2015-11-26 DIAGNOSIS — R29818 Other symptoms and signs involving the nervous system: Secondary | ICD-10-CM | POA: Diagnosis not present

## 2015-11-26 DIAGNOSIS — M6281 Muscle weakness (generalized): Secondary | ICD-10-CM | POA: Diagnosis not present

## 2015-11-27 DIAGNOSIS — N184 Chronic kidney disease, stage 4 (severe): Secondary | ICD-10-CM | POA: Diagnosis not present

## 2015-11-27 DIAGNOSIS — I10 Essential (primary) hypertension: Secondary | ICD-10-CM | POA: Diagnosis not present

## 2015-11-27 DIAGNOSIS — M6281 Muscle weakness (generalized): Secondary | ICD-10-CM | POA: Diagnosis not present

## 2015-11-27 DIAGNOSIS — E119 Type 2 diabetes mellitus without complications: Secondary | ICD-10-CM | POA: Diagnosis not present

## 2015-11-27 DIAGNOSIS — J449 Chronic obstructive pulmonary disease, unspecified: Secondary | ICD-10-CM | POA: Diagnosis not present

## 2015-11-27 DIAGNOSIS — R2681 Unsteadiness on feet: Secondary | ICD-10-CM | POA: Diagnosis not present

## 2015-11-27 DIAGNOSIS — R011 Cardiac murmur, unspecified: Secondary | ICD-10-CM | POA: Diagnosis not present

## 2015-11-27 DIAGNOSIS — R29818 Other symptoms and signs involving the nervous system: Secondary | ICD-10-CM | POA: Diagnosis not present

## 2015-11-27 DIAGNOSIS — R29898 Other symptoms and signs involving the musculoskeletal system: Secondary | ICD-10-CM | POA: Diagnosis not present

## 2015-11-28 DIAGNOSIS — R29898 Other symptoms and signs involving the musculoskeletal system: Secondary | ICD-10-CM | POA: Diagnosis not present

## 2015-11-28 DIAGNOSIS — M6281 Muscle weakness (generalized): Secondary | ICD-10-CM | POA: Diagnosis not present

## 2015-11-28 DIAGNOSIS — R2681 Unsteadiness on feet: Secondary | ICD-10-CM | POA: Diagnosis not present

## 2015-11-28 DIAGNOSIS — R29818 Other symptoms and signs involving the nervous system: Secondary | ICD-10-CM | POA: Diagnosis not present

## 2015-11-29 DIAGNOSIS — M6281 Muscle weakness (generalized): Secondary | ICD-10-CM | POA: Diagnosis not present

## 2015-11-29 DIAGNOSIS — R29818 Other symptoms and signs involving the nervous system: Secondary | ICD-10-CM | POA: Diagnosis not present

## 2015-11-29 DIAGNOSIS — R2681 Unsteadiness on feet: Secondary | ICD-10-CM | POA: Diagnosis not present

## 2015-11-29 DIAGNOSIS — R29898 Other symptoms and signs involving the musculoskeletal system: Secondary | ICD-10-CM | POA: Diagnosis not present

## 2015-11-30 DIAGNOSIS — R2681 Unsteadiness on feet: Secondary | ICD-10-CM | POA: Diagnosis not present

## 2015-11-30 DIAGNOSIS — M6281 Muscle weakness (generalized): Secondary | ICD-10-CM | POA: Diagnosis not present

## 2015-11-30 DIAGNOSIS — R29818 Other symptoms and signs involving the nervous system: Secondary | ICD-10-CM | POA: Diagnosis not present

## 2015-11-30 DIAGNOSIS — R29898 Other symptoms and signs involving the musculoskeletal system: Secondary | ICD-10-CM | POA: Diagnosis not present

## 2015-12-03 DIAGNOSIS — R29898 Other symptoms and signs involving the musculoskeletal system: Secondary | ICD-10-CM | POA: Diagnosis not present

## 2015-12-03 DIAGNOSIS — R29818 Other symptoms and signs involving the nervous system: Secondary | ICD-10-CM | POA: Diagnosis not present

## 2015-12-03 DIAGNOSIS — R2681 Unsteadiness on feet: Secondary | ICD-10-CM | POA: Diagnosis not present

## 2015-12-03 DIAGNOSIS — M6281 Muscle weakness (generalized): Secondary | ICD-10-CM | POA: Diagnosis not present

## 2015-12-04 DIAGNOSIS — R29818 Other symptoms and signs involving the nervous system: Secondary | ICD-10-CM | POA: Diagnosis not present

## 2015-12-04 DIAGNOSIS — R2681 Unsteadiness on feet: Secondary | ICD-10-CM | POA: Diagnosis not present

## 2015-12-04 DIAGNOSIS — M6281 Muscle weakness (generalized): Secondary | ICD-10-CM | POA: Diagnosis not present

## 2015-12-04 DIAGNOSIS — R29898 Other symptoms and signs involving the musculoskeletal system: Secondary | ICD-10-CM | POA: Diagnosis not present

## 2015-12-05 DIAGNOSIS — R2681 Unsteadiness on feet: Secondary | ICD-10-CM | POA: Diagnosis not present

## 2015-12-05 DIAGNOSIS — M6281 Muscle weakness (generalized): Secondary | ICD-10-CM | POA: Diagnosis not present

## 2015-12-05 DIAGNOSIS — R29898 Other symptoms and signs involving the musculoskeletal system: Secondary | ICD-10-CM | POA: Diagnosis not present

## 2015-12-05 DIAGNOSIS — R29818 Other symptoms and signs involving the nervous system: Secondary | ICD-10-CM | POA: Diagnosis not present

## 2015-12-11 DIAGNOSIS — R2681 Unsteadiness on feet: Secondary | ICD-10-CM | POA: Diagnosis not present

## 2015-12-11 DIAGNOSIS — R29818 Other symptoms and signs involving the nervous system: Secondary | ICD-10-CM | POA: Diagnosis not present

## 2015-12-11 DIAGNOSIS — R29898 Other symptoms and signs involving the musculoskeletal system: Secondary | ICD-10-CM | POA: Diagnosis not present

## 2015-12-11 DIAGNOSIS — M6281 Muscle weakness (generalized): Secondary | ICD-10-CM | POA: Diagnosis not present

## 2015-12-12 DIAGNOSIS — M6281 Muscle weakness (generalized): Secondary | ICD-10-CM | POA: Diagnosis not present

## 2015-12-12 DIAGNOSIS — R29898 Other symptoms and signs involving the musculoskeletal system: Secondary | ICD-10-CM | POA: Diagnosis not present

## 2015-12-12 DIAGNOSIS — R2681 Unsteadiness on feet: Secondary | ICD-10-CM | POA: Diagnosis not present

## 2015-12-12 DIAGNOSIS — R29818 Other symptoms and signs involving the nervous system: Secondary | ICD-10-CM | POA: Diagnosis not present

## 2015-12-13 DIAGNOSIS — R2681 Unsteadiness on feet: Secondary | ICD-10-CM | POA: Diagnosis not present

## 2015-12-13 DIAGNOSIS — M6281 Muscle weakness (generalized): Secondary | ICD-10-CM | POA: Diagnosis not present

## 2015-12-13 DIAGNOSIS — R29818 Other symptoms and signs involving the nervous system: Secondary | ICD-10-CM | POA: Diagnosis not present

## 2015-12-13 DIAGNOSIS — R29898 Other symptoms and signs involving the musculoskeletal system: Secondary | ICD-10-CM | POA: Diagnosis not present

## 2015-12-14 DIAGNOSIS — R2681 Unsteadiness on feet: Secondary | ICD-10-CM | POA: Diagnosis not present

## 2015-12-14 DIAGNOSIS — R29898 Other symptoms and signs involving the musculoskeletal system: Secondary | ICD-10-CM | POA: Diagnosis not present

## 2015-12-14 DIAGNOSIS — M6281 Muscle weakness (generalized): Secondary | ICD-10-CM | POA: Diagnosis not present

## 2015-12-14 DIAGNOSIS — D649 Anemia, unspecified: Secondary | ICD-10-CM | POA: Diagnosis not present

## 2015-12-14 DIAGNOSIS — R29818 Other symptoms and signs involving the nervous system: Secondary | ICD-10-CM | POA: Diagnosis not present

## 2015-12-17 DIAGNOSIS — R29818 Other symptoms and signs involving the nervous system: Secondary | ICD-10-CM | POA: Diagnosis not present

## 2015-12-17 DIAGNOSIS — R2681 Unsteadiness on feet: Secondary | ICD-10-CM | POA: Diagnosis not present

## 2015-12-17 DIAGNOSIS — R29898 Other symptoms and signs involving the musculoskeletal system: Secondary | ICD-10-CM | POA: Diagnosis not present

## 2015-12-17 DIAGNOSIS — M6281 Muscle weakness (generalized): Secondary | ICD-10-CM | POA: Diagnosis not present

## 2015-12-18 DIAGNOSIS — R29898 Other symptoms and signs involving the musculoskeletal system: Secondary | ICD-10-CM | POA: Diagnosis not present

## 2015-12-18 DIAGNOSIS — M6281 Muscle weakness (generalized): Secondary | ICD-10-CM | POA: Diagnosis not present

## 2015-12-18 DIAGNOSIS — R2681 Unsteadiness on feet: Secondary | ICD-10-CM | POA: Diagnosis not present

## 2015-12-18 DIAGNOSIS — R29818 Other symptoms and signs involving the nervous system: Secondary | ICD-10-CM | POA: Diagnosis not present

## 2015-12-19 DIAGNOSIS — R29898 Other symptoms and signs involving the musculoskeletal system: Secondary | ICD-10-CM | POA: Diagnosis not present

## 2015-12-19 DIAGNOSIS — R29818 Other symptoms and signs involving the nervous system: Secondary | ICD-10-CM | POA: Diagnosis not present

## 2015-12-19 DIAGNOSIS — M6281 Muscle weakness (generalized): Secondary | ICD-10-CM | POA: Diagnosis not present

## 2015-12-19 DIAGNOSIS — R2681 Unsteadiness on feet: Secondary | ICD-10-CM | POA: Diagnosis not present

## 2015-12-20 DIAGNOSIS — D649 Anemia, unspecified: Secondary | ICD-10-CM | POA: Diagnosis not present

## 2015-12-20 DIAGNOSIS — N184 Chronic kidney disease, stage 4 (severe): Secondary | ICD-10-CM | POA: Diagnosis not present

## 2015-12-20 DIAGNOSIS — E785 Hyperlipidemia, unspecified: Secondary | ICD-10-CM | POA: Diagnosis not present

## 2015-12-20 DIAGNOSIS — I10 Essential (primary) hypertension: Secondary | ICD-10-CM | POA: Diagnosis not present

## 2015-12-21 DIAGNOSIS — R29818 Other symptoms and signs involving the nervous system: Secondary | ICD-10-CM | POA: Diagnosis not present

## 2015-12-21 DIAGNOSIS — R29898 Other symptoms and signs involving the musculoskeletal system: Secondary | ICD-10-CM | POA: Diagnosis not present

## 2015-12-21 DIAGNOSIS — R2681 Unsteadiness on feet: Secondary | ICD-10-CM | POA: Diagnosis not present

## 2015-12-21 DIAGNOSIS — M6281 Muscle weakness (generalized): Secondary | ICD-10-CM | POA: Diagnosis not present

## 2015-12-24 DIAGNOSIS — M6281 Muscle weakness (generalized): Secondary | ICD-10-CM | POA: Diagnosis not present

## 2015-12-24 DIAGNOSIS — R29818 Other symptoms and signs involving the nervous system: Secondary | ICD-10-CM | POA: Diagnosis not present

## 2015-12-24 DIAGNOSIS — R29898 Other symptoms and signs involving the musculoskeletal system: Secondary | ICD-10-CM | POA: Diagnosis not present

## 2015-12-24 DIAGNOSIS — R2681 Unsteadiness on feet: Secondary | ICD-10-CM | POA: Diagnosis not present

## 2015-12-25 DIAGNOSIS — R2681 Unsteadiness on feet: Secondary | ICD-10-CM | POA: Diagnosis not present

## 2015-12-25 DIAGNOSIS — R29818 Other symptoms and signs involving the nervous system: Secondary | ICD-10-CM | POA: Diagnosis not present

## 2015-12-25 DIAGNOSIS — M6281 Muscle weakness (generalized): Secondary | ICD-10-CM | POA: Diagnosis not present

## 2015-12-25 DIAGNOSIS — R29898 Other symptoms and signs involving the musculoskeletal system: Secondary | ICD-10-CM | POA: Diagnosis not present

## 2015-12-26 DIAGNOSIS — R29818 Other symptoms and signs involving the nervous system: Secondary | ICD-10-CM | POA: Diagnosis not present

## 2015-12-26 DIAGNOSIS — R29898 Other symptoms and signs involving the musculoskeletal system: Secondary | ICD-10-CM | POA: Diagnosis not present

## 2015-12-26 DIAGNOSIS — M6281 Muscle weakness (generalized): Secondary | ICD-10-CM | POA: Diagnosis not present

## 2015-12-26 DIAGNOSIS — R2681 Unsteadiness on feet: Secondary | ICD-10-CM | POA: Diagnosis not present

## 2015-12-27 DIAGNOSIS — R2681 Unsteadiness on feet: Secondary | ICD-10-CM | POA: Diagnosis not present

## 2015-12-27 DIAGNOSIS — R29898 Other symptoms and signs involving the musculoskeletal system: Secondary | ICD-10-CM | POA: Diagnosis not present

## 2015-12-27 DIAGNOSIS — M6281 Muscle weakness (generalized): Secondary | ICD-10-CM | POA: Diagnosis not present

## 2015-12-27 DIAGNOSIS — R29818 Other symptoms and signs involving the nervous system: Secondary | ICD-10-CM | POA: Diagnosis not present

## 2015-12-31 DIAGNOSIS — R29898 Other symptoms and signs involving the musculoskeletal system: Secondary | ICD-10-CM | POA: Diagnosis not present

## 2015-12-31 DIAGNOSIS — R29818 Other symptoms and signs involving the nervous system: Secondary | ICD-10-CM | POA: Diagnosis not present

## 2015-12-31 DIAGNOSIS — R2681 Unsteadiness on feet: Secondary | ICD-10-CM | POA: Diagnosis not present

## 2015-12-31 DIAGNOSIS — M6281 Muscle weakness (generalized): Secondary | ICD-10-CM | POA: Diagnosis not present

## 2016-01-01 DIAGNOSIS — M6281 Muscle weakness (generalized): Secondary | ICD-10-CM | POA: Diagnosis not present

## 2016-01-01 DIAGNOSIS — R29818 Other symptoms and signs involving the nervous system: Secondary | ICD-10-CM | POA: Diagnosis not present

## 2016-01-01 DIAGNOSIS — R29898 Other symptoms and signs involving the musculoskeletal system: Secondary | ICD-10-CM | POA: Diagnosis not present

## 2016-01-01 DIAGNOSIS — R2681 Unsteadiness on feet: Secondary | ICD-10-CM | POA: Diagnosis not present

## 2016-01-02 DIAGNOSIS — R2681 Unsteadiness on feet: Secondary | ICD-10-CM | POA: Diagnosis not present

## 2016-01-02 DIAGNOSIS — R29898 Other symptoms and signs involving the musculoskeletal system: Secondary | ICD-10-CM | POA: Diagnosis not present

## 2016-01-02 DIAGNOSIS — R29818 Other symptoms and signs involving the nervous system: Secondary | ICD-10-CM | POA: Diagnosis not present

## 2016-01-02 DIAGNOSIS — M6281 Muscle weakness (generalized): Secondary | ICD-10-CM | POA: Diagnosis not present

## 2016-01-04 DIAGNOSIS — R29898 Other symptoms and signs involving the musculoskeletal system: Secondary | ICD-10-CM | POA: Diagnosis not present

## 2016-01-04 DIAGNOSIS — R29818 Other symptoms and signs involving the nervous system: Secondary | ICD-10-CM | POA: Diagnosis not present

## 2016-01-04 DIAGNOSIS — R2681 Unsteadiness on feet: Secondary | ICD-10-CM | POA: Diagnosis not present

## 2016-01-04 DIAGNOSIS — M6281 Muscle weakness (generalized): Secondary | ICD-10-CM | POA: Diagnosis not present

## 2016-01-07 ENCOUNTER — Telehealth: Payer: Self-pay | Admitting: Pulmonary Disease

## 2016-01-07 DIAGNOSIS — M6281 Muscle weakness (generalized): Secondary | ICD-10-CM | POA: Diagnosis not present

## 2016-01-07 DIAGNOSIS — R2681 Unsteadiness on feet: Secondary | ICD-10-CM | POA: Diagnosis not present

## 2016-01-07 DIAGNOSIS — R29818 Other symptoms and signs involving the nervous system: Secondary | ICD-10-CM | POA: Diagnosis not present

## 2016-01-07 DIAGNOSIS — R29898 Other symptoms and signs involving the musculoskeletal system: Secondary | ICD-10-CM | POA: Diagnosis not present

## 2016-01-07 NOTE — Telephone Encounter (Signed)
Called spoke with daughter Marliss Coots. Made her aware it looks like pt must have the sleep study in order to get her qualified here for insurance purposes. She is going to take pt to the study and see how she does. If she is unable to have this done, she will call and let us know. nothing further needed.

## 2016-01-08 DIAGNOSIS — R29818 Other symptoms and signs involving the nervous system: Secondary | ICD-10-CM | POA: Diagnosis not present

## 2016-01-08 DIAGNOSIS — R29898 Other symptoms and signs involving the musculoskeletal system: Secondary | ICD-10-CM | POA: Diagnosis not present

## 2016-01-08 DIAGNOSIS — R2681 Unsteadiness on feet: Secondary | ICD-10-CM | POA: Diagnosis not present

## 2016-01-08 DIAGNOSIS — M6281 Muscle weakness (generalized): Secondary | ICD-10-CM | POA: Diagnosis not present

## 2016-01-09 DIAGNOSIS — R29818 Other symptoms and signs involving the nervous system: Secondary | ICD-10-CM | POA: Diagnosis not present

## 2016-01-09 DIAGNOSIS — R29898 Other symptoms and signs involving the musculoskeletal system: Secondary | ICD-10-CM | POA: Diagnosis not present

## 2016-01-09 DIAGNOSIS — R2681 Unsteadiness on feet: Secondary | ICD-10-CM | POA: Diagnosis not present

## 2016-01-09 DIAGNOSIS — M6281 Muscle weakness (generalized): Secondary | ICD-10-CM | POA: Diagnosis not present

## 2016-01-10 DIAGNOSIS — R29898 Other symptoms and signs involving the musculoskeletal system: Secondary | ICD-10-CM | POA: Diagnosis not present

## 2016-01-10 DIAGNOSIS — M6281 Muscle weakness (generalized): Secondary | ICD-10-CM | POA: Diagnosis not present

## 2016-01-10 DIAGNOSIS — R2681 Unsteadiness on feet: Secondary | ICD-10-CM | POA: Diagnosis not present

## 2016-01-10 DIAGNOSIS — R29818 Other symptoms and signs involving the nervous system: Secondary | ICD-10-CM | POA: Diagnosis not present

## 2016-01-11 ENCOUNTER — Ambulatory Visit (HOSPITAL_BASED_OUTPATIENT_CLINIC_OR_DEPARTMENT_OTHER): Payer: Medicare Other

## 2016-01-11 ENCOUNTER — Ambulatory Visit (HOSPITAL_BASED_OUTPATIENT_CLINIC_OR_DEPARTMENT_OTHER): Payer: Medicare Other | Attending: Pulmonary Disease | Admitting: *Deleted

## 2016-01-11 DIAGNOSIS — R0683 Snoring: Secondary | ICD-10-CM | POA: Diagnosis not present

## 2016-01-11 DIAGNOSIS — R29818 Other symptoms and signs involving the nervous system: Secondary | ICD-10-CM | POA: Diagnosis not present

## 2016-01-11 DIAGNOSIS — R2681 Unsteadiness on feet: Secondary | ICD-10-CM | POA: Diagnosis not present

## 2016-01-11 DIAGNOSIS — R29898 Other symptoms and signs involving the musculoskeletal system: Secondary | ICD-10-CM | POA: Diagnosis not present

## 2016-01-11 DIAGNOSIS — M6281 Muscle weakness (generalized): Secondary | ICD-10-CM | POA: Diagnosis not present

## 2016-01-11 DIAGNOSIS — G4733 Obstructive sleep apnea (adult) (pediatric): Secondary | ICD-10-CM | POA: Insufficient documentation

## 2016-01-11 DIAGNOSIS — G4734 Idiopathic sleep related nonobstructive alveolar hypoventilation: Secondary | ICD-10-CM | POA: Insufficient documentation

## 2016-01-14 DIAGNOSIS — M6281 Muscle weakness (generalized): Secondary | ICD-10-CM | POA: Diagnosis not present

## 2016-01-14 DIAGNOSIS — R2681 Unsteadiness on feet: Secondary | ICD-10-CM | POA: Diagnosis not present

## 2016-01-14 DIAGNOSIS — R29818 Other symptoms and signs involving the nervous system: Secondary | ICD-10-CM | POA: Diagnosis not present

## 2016-01-14 DIAGNOSIS — R29898 Other symptoms and signs involving the musculoskeletal system: Secondary | ICD-10-CM | POA: Diagnosis not present

## 2016-01-16 DIAGNOSIS — G4733 Obstructive sleep apnea (adult) (pediatric): Secondary | ICD-10-CM | POA: Diagnosis not present

## 2016-01-16 DIAGNOSIS — M6281 Muscle weakness (generalized): Secondary | ICD-10-CM | POA: Diagnosis not present

## 2016-01-16 DIAGNOSIS — R2681 Unsteadiness on feet: Secondary | ICD-10-CM | POA: Diagnosis not present

## 2016-01-16 DIAGNOSIS — R29898 Other symptoms and signs involving the musculoskeletal system: Secondary | ICD-10-CM | POA: Diagnosis not present

## 2016-01-16 DIAGNOSIS — R29818 Other symptoms and signs involving the nervous system: Secondary | ICD-10-CM | POA: Diagnosis not present

## 2016-01-16 NOTE — Progress Notes (Signed)
Patient Name: Kathryn Peck, Kathryn Peck Date: 01/11/2016 Gender: Female D.O.B: January 10, 1928 Age (years): 77 Referring Provider: Marshell Garfinkel Height (inches): 62 Interpreting Physician: Kara Mead MD, ABSM Weight (lbs): 205 RPSGT: Gerhard Perches BMI: 37 MRN: [id number] Neck Size: 16.00   CLINICAL INFORMATION Sleep Study Type: NPSG Indication for sleep study: OSA, She has been maintained on CPAP with nasal mask for many years Epworth Sleepiness Score: 5   SLEEP STUDY TECHNIQUE As per the AASM Manual for the Scoring of Sleep and Associated Events v2.3 (April 2016) with a hypopnea requiring 4% desaturations. The channels recorded and monitored were frontal, central and occipital EEG, electrooculogram (EOG), submentalis EMG (chin), nasal and oral airflow, thoracic and abdominal wall motion, anterior tibialis EMG, snore microphone, electrocardiogram, and pulse oximetry.   MEDICATIONS  Medications self-administered by patient during sleep study : No sleep medicine administered.   SLEEP ARCHITECTURE The study was initiated at 9:54:26 PM and ended at 4:36:27 AM. Sleep onset time was 19.9 minutes and the sleep efficiency was 27.5%. The total sleep time was 110.5 minutes. Stage REM latency was 330.0 minutes. The patient spent 47.96% of the night in stage N1 sleep, 46.61% in stage N2 sleep, 0.00% in stage N3 and 5.43% in REM. Alpha intrusion was absent. Supine sleep was 90.05%.   RESPIRATORY PARAMETERS The overall apnea/hypopnea index (AHI) was 0.5 per hour. There were 1 total apneas, including 1 obstructive, 0 central and 0 mixed apneas. There were 0 hypopneas and 12 RERAs. The AHI during Stage REM sleep was 0.0 per hour. AHI while supine was 0.6 per hour. The mean oxygen saturation was 96.90%. The minimum SpO2 during sleep was 88.00%. Soft snoring was noted during this study.  CARDIAC DATA The 2 lead EKG demonstrated sinus rhythm. The mean heart rate was 59.73 beats per minute.  Other EKG findings include: None.   LEG MOVEMENT DATA The total PLMS were 0 with a resulting PLMS index of 0.00. Associated arousal with leg movement index was 0.0 .   IMPRESSIONS - No significant obstructive sleep apnea occurred during this study (AHI = 0.5/h). - No significant central sleep apnea occurred during this study (CAI = 0.0/h). - Mild oxygen desaturation was noted during this study (Min O2 = 88.00%). - Sleep efficiency was very poor with less than 2 hours of sleep noted - The patient snored with Soft snoring volume. - No cardiac abnormalities were noted during this study. - Clinically significant periodic limb movements did not occur during sleep. No significant associated arousals.   DIAGNOSIS - Nocturnal hypoxia- note that the study was performed on 2 L of oxygen   RECOMMENDATIONS - This study did not show significant sleep disordered breathing. Total sleep time was very low. The study was performed on 2 L oxygen and no hypoxia was noted.  - Avoid alcohol, sedatives and other CNS depressants that may worsen sleep apnea and disrupt normal sleep architecture. - Sleep hygiene should be reviewed to assess factors that may improve sleep quality. - Weight management and regular exercise should be initiated or continued if appropriate.  Kara Mead MD. [place] [facility]. [facility]

## 2016-01-17 ENCOUNTER — Telehealth: Payer: Self-pay | Admitting: Hematology

## 2016-01-17 DIAGNOSIS — R29818 Other symptoms and signs involving the nervous system: Secondary | ICD-10-CM | POA: Diagnosis not present

## 2016-01-17 DIAGNOSIS — R29898 Other symptoms and signs involving the musculoskeletal system: Secondary | ICD-10-CM | POA: Diagnosis not present

## 2016-01-17 DIAGNOSIS — R2681 Unsteadiness on feet: Secondary | ICD-10-CM | POA: Diagnosis not present

## 2016-01-17 DIAGNOSIS — M6281 Muscle weakness (generalized): Secondary | ICD-10-CM | POA: Diagnosis not present

## 2016-01-17 NOTE — Telephone Encounter (Signed)
PT DECLINE AN APPT AT THIS TIME, FEEL LIKE SHE IS DOING WELL.

## 2016-01-18 ENCOUNTER — Telehealth: Payer: Self-pay | Admitting: Pulmonary Disease

## 2016-01-18 DIAGNOSIS — M6281 Muscle weakness (generalized): Secondary | ICD-10-CM | POA: Diagnosis not present

## 2016-01-18 DIAGNOSIS — R2681 Unsteadiness on feet: Secondary | ICD-10-CM | POA: Diagnosis not present

## 2016-01-18 DIAGNOSIS — R29818 Other symptoms and signs involving the nervous system: Secondary | ICD-10-CM | POA: Diagnosis not present

## 2016-01-18 DIAGNOSIS — R29898 Other symptoms and signs involving the musculoskeletal system: Secondary | ICD-10-CM | POA: Diagnosis not present

## 2016-01-18 DIAGNOSIS — G4733 Obstructive sleep apnea (adult) (pediatric): Secondary | ICD-10-CM

## 2016-01-18 NOTE — Telephone Encounter (Signed)
Message     Please let the pt know that the sleep study did not show any sleep apnea. She does not need to use the CPAP. She does need to use the O2 at 2LPM at night for desaturations.        PM    Called spoke with patient's daughter Marliss Coots to discuss above results.  Marliss Coots states that she is unclear how pt did not show any sleep apnea "because she didn't sleep long enough to even use the CPAP machine."  Belinda also does not think that pt will not do well on O2 alone stating that pt "tosses and turns and is restless when she doesn't wear her CPAP."  Marliss Coots is requesting further clarification.  Dr Vaughan Browner please advise, thank you.

## 2016-01-21 DIAGNOSIS — R29818 Other symptoms and signs involving the nervous system: Secondary | ICD-10-CM | POA: Diagnosis not present

## 2016-01-21 DIAGNOSIS — R2681 Unsteadiness on feet: Secondary | ICD-10-CM | POA: Diagnosis not present

## 2016-01-21 DIAGNOSIS — M6281 Muscle weakness (generalized): Secondary | ICD-10-CM | POA: Diagnosis not present

## 2016-01-21 DIAGNOSIS — R29898 Other symptoms and signs involving the musculoskeletal system: Secondary | ICD-10-CM | POA: Diagnosis not present

## 2016-01-21 NOTE — Telephone Encounter (Signed)
I spoke with the daughter regarding the sleep study. They wish to continue using the CPAP with O2 as the pt is comfortable with it. Not sure if insurance will cover it. If they refuse then we may have to get a home sleep test.

## 2016-01-22 DIAGNOSIS — R2681 Unsteadiness on feet: Secondary | ICD-10-CM | POA: Diagnosis not present

## 2016-01-22 DIAGNOSIS — M6281 Muscle weakness (generalized): Secondary | ICD-10-CM | POA: Diagnosis not present

## 2016-01-22 DIAGNOSIS — R29898 Other symptoms and signs involving the musculoskeletal system: Secondary | ICD-10-CM | POA: Diagnosis not present

## 2016-01-22 DIAGNOSIS — R29818 Other symptoms and signs involving the nervous system: Secondary | ICD-10-CM | POA: Diagnosis not present

## 2016-01-23 DIAGNOSIS — R29898 Other symptoms and signs involving the musculoskeletal system: Secondary | ICD-10-CM | POA: Diagnosis not present

## 2016-01-23 DIAGNOSIS — M6281 Muscle weakness (generalized): Secondary | ICD-10-CM | POA: Diagnosis not present

## 2016-01-23 DIAGNOSIS — R2681 Unsteadiness on feet: Secondary | ICD-10-CM | POA: Diagnosis not present

## 2016-01-23 DIAGNOSIS — R29818 Other symptoms and signs involving the nervous system: Secondary | ICD-10-CM | POA: Diagnosis not present

## 2016-01-24 DIAGNOSIS — R2681 Unsteadiness on feet: Secondary | ICD-10-CM | POA: Diagnosis not present

## 2016-01-24 DIAGNOSIS — R29818 Other symptoms and signs involving the nervous system: Secondary | ICD-10-CM | POA: Diagnosis not present

## 2016-01-24 DIAGNOSIS — R29898 Other symptoms and signs involving the musculoskeletal system: Secondary | ICD-10-CM | POA: Diagnosis not present

## 2016-01-24 DIAGNOSIS — M6281 Muscle weakness (generalized): Secondary | ICD-10-CM | POA: Diagnosis not present

## 2016-01-24 NOTE — Telephone Encounter (Signed)
Order placed to see if pt's insurance will pay for the CPAP Nothing further needed at this time; will sign off

## 2016-01-25 DIAGNOSIS — H02839 Dermatochalasis of unspecified eye, unspecified eyelid: Secondary | ICD-10-CM | POA: Diagnosis not present

## 2016-01-25 DIAGNOSIS — Z961 Presence of intraocular lens: Secondary | ICD-10-CM | POA: Diagnosis not present

## 2016-01-25 DIAGNOSIS — H40053 Ocular hypertension, bilateral: Secondary | ICD-10-CM | POA: Diagnosis not present

## 2016-01-25 DIAGNOSIS — D3132 Benign neoplasm of left choroid: Secondary | ICD-10-CM | POA: Diagnosis not present

## 2016-01-28 DIAGNOSIS — M6281 Muscle weakness (generalized): Secondary | ICD-10-CM | POA: Diagnosis not present

## 2016-01-28 DIAGNOSIS — R29898 Other symptoms and signs involving the musculoskeletal system: Secondary | ICD-10-CM | POA: Diagnosis not present

## 2016-01-28 DIAGNOSIS — R29818 Other symptoms and signs involving the nervous system: Secondary | ICD-10-CM | POA: Diagnosis not present

## 2016-01-28 DIAGNOSIS — R2681 Unsteadiness on feet: Secondary | ICD-10-CM | POA: Diagnosis not present

## 2016-01-29 DIAGNOSIS — R29898 Other symptoms and signs involving the musculoskeletal system: Secondary | ICD-10-CM | POA: Diagnosis not present

## 2016-01-29 DIAGNOSIS — R29818 Other symptoms and signs involving the nervous system: Secondary | ICD-10-CM | POA: Diagnosis not present

## 2016-01-29 DIAGNOSIS — M6281 Muscle weakness (generalized): Secondary | ICD-10-CM | POA: Diagnosis not present

## 2016-01-29 DIAGNOSIS — R2681 Unsteadiness on feet: Secondary | ICD-10-CM | POA: Diagnosis not present

## 2016-01-30 ENCOUNTER — Telehealth: Payer: Self-pay | Admitting: *Deleted

## 2016-01-30 ENCOUNTER — Encounter: Payer: Self-pay | Admitting: Pulmonary Disease

## 2016-01-30 ENCOUNTER — Ambulatory Visit (INDEPENDENT_AMBULATORY_CARE_PROVIDER_SITE_OTHER): Payer: Medicare Other | Admitting: Pulmonary Disease

## 2016-01-30 VITALS — BP 122/74 | HR 64 | Ht 62.0 in | Wt 200.6 lb

## 2016-01-30 DIAGNOSIS — J439 Emphysema, unspecified: Secondary | ICD-10-CM

## 2016-01-30 DIAGNOSIS — M6281 Muscle weakness (generalized): Secondary | ICD-10-CM | POA: Diagnosis not present

## 2016-01-30 DIAGNOSIS — R29818 Other symptoms and signs involving the nervous system: Secondary | ICD-10-CM | POA: Diagnosis not present

## 2016-01-30 DIAGNOSIS — G4733 Obstructive sleep apnea (adult) (pediatric): Secondary | ICD-10-CM | POA: Diagnosis not present

## 2016-01-30 DIAGNOSIS — R2681 Unsteadiness on feet: Secondary | ICD-10-CM | POA: Diagnosis not present

## 2016-01-30 DIAGNOSIS — R29898 Other symptoms and signs involving the musculoskeletal system: Secondary | ICD-10-CM | POA: Diagnosis not present

## 2016-01-30 LAB — PULMONARY FUNCTION TEST
DL/VA % pred: 83 %
DL/VA: 3.78 ml/min/mmHg/L
DLCO COR % PRED: 62 %
DLCO UNC: 12.76 ml/min/mmHg
DLCO cor: 13.52 ml/min/mmHg
DLCO unc % pred: 59 %
FEF 25-75 POST: 1.18 L/s
FEF 25-75 Pre: 0.76 L/sec
FEF2575-%Change-Post: 55 %
FEF2575-%Pred-Post: 118 %
FEF2575-%Pred-Pre: 76 %
FEV1-%CHANGE-POST: 7 %
FEV1-%PRED-POST: 92 %
FEV1-%Pred-Pre: 85 %
FEV1-POST: 1.43 L
FEV1-PRE: 1.33 L
FEV1FVC-%Change-Post: 2 %
FEV1FVC-%PRED-PRE: 97 %
FEV6-%CHANGE-POST: 5 %
FEV6-%PRED-POST: 98 %
FEV6-%Pred-Pre: 92 %
FEV6-POST: 1.95 L
FEV6-PRE: 1.84 L
FEV6FVC-%CHANGE-POST: 1 %
FEV6FVC-%PRED-POST: 107 %
FEV6FVC-%Pred-Pre: 105 %
FVC-%CHANGE-POST: 4 %
FVC-%Pred-Post: 91 %
FVC-%Pred-Pre: 87 %
FVC-PRE: 1.86 L
FVC-Post: 1.95 L
PRE FEV1/FVC RATIO: 71 %
PRE FEV6/FVC RATIO: 98 %
Post FEV1/FVC ratio: 73 %
Post FEV6/FVC ratio: 100 %
RV % pred: 97 %
RV: 2.33 L
TLC % PRED: 90 %
TLC: 4.3 L

## 2016-01-30 NOTE — Patient Instructions (Signed)
Continue using the CPAP and Spiriva.  We'll make follow-up appointment female in 6 months.

## 2016-01-30 NOTE — Progress Notes (Signed)
PFT done today. 

## 2016-01-30 NOTE — Telephone Encounter (Signed)
-----   Message from Kara Mead sent at 01/24/2016  4:54 PM EST ----- Regarding: Response from [facility],  I sent the Order/question on this pt to [facility]  about the daughter wanting pt to keep CPAP with 02 after a negative SS.  Below is the response Melissa sent back.  Please advise Dr. Vaughan Browner and/or pt's daughter.  Thanks, Dawne ----- Message -----    From: Darlina Guys    Sent: 01/24/2016   2:12 PM      To: Kara Mead  I took this question to our CPAP guru and I'm just going to forward her comments to you below.  Let me know if you have questions.    "We were only transitioning her for PAP supplies, not new equipment. I have a SS from 2006 but not sure when she received her current machine or who paid for it.  When she lived in [place] that is a different region for [facility] than ours and I have no way to check to see if she is eligible for new equipment.  We cannot use any sats from 2006.  So if they are wanting the O2, she will have to requalify for it through [facility] current guidelines which means she would have to have a new titration done, reach her therapeutic pressure and still desat below 88% for 5 min or more.  The question is kind of unclear as I am not sure whether they are asking about CPAP or O2.  If they want a CPAP, we will need to know when she got her current machine and if it is within the last 5 years, we cannot provide a new one as [facility] will not pay.we would only be able to transition for supplies.    If she has not been using her machine for more than 30 days, she would have to start the process all over again with a new OV that speaks of sleep issues and/or reason for SS and that a new DIAGNOSTIC sleep study is being ordered.again.to qualify her for O2, they would also have to do a titration and meet the requirements I detailed above.  BUT.if she has a machine within the last 5 years, she would have to use that one as they still will not pay for another machine.just  supplies."     ----- Message -----    From: Kara Mead    Sent: 01/24/2016   2:09 PM      To: Melissa Stenson  Thanks! ----- Message -----    From: Darlina Guys    Sent: 01/24/2016   1:46 PM      To: Kara Mead  I'm working on this one ----- Message -----    From: Kara Mead    Sent: 01/24/2016  11:45 AM      To: Melissa Stenson  Please see order in computer.   Can we check to see if pt's insurance will continue to pay for her CPAP with O2?  The split night ordered by our office was negative.  Unsure if [facility] has her previous one on file.  The patient's daughter feels that the CPAP is still necessary.  Thanks [employer]

## 2016-01-30 NOTE — Telephone Encounter (Signed)
Below information discussed at the 2.15.17 ov with PM  Nothing needed, pt will use her current machine Will sign off

## 2016-01-30 NOTE — Progress Notes (Signed)
Subjective:    Patient ID: Kathryn Peck, female    DOB: [birth year], 82 y.o.   MRN: [id number]  HPI Follow up for COPD, sleep apnea.  Kathryn Peck is a 82 year old with history of hypertension, hyperlipidemia, chronic kidney disease, COPD, sleep apnea. She recently moved here last year from [place] into [place]. She has a diagnosis of COPD but is a never smoker. She is on Spiriva. She has some dyspnea on exertion but it does not appear to bother her much. She denies any cough, sputum production, wheezing, hemoptysis.   She also has a diagnosis of OSA and is on CPAP for the past 4-5 years. She does not know what pressure settings she is on. She is on nocturnal oxygen via CPAP at 2 LPM.  DATA: PFTs 01/30/16 FVC 1.86 [87%) FEV1 1.33 [85%) F/F 71 TLC 90% DLCO 59% Minimal obstructive disease, moderate reduction in DLCO which however corrects for alveolar volume.  Sleep study 01/16/16 Study did not show significant sleep-disordered breathing however the sleep time was pretty low. Study was performed on 2 L O2 and did not show any hypoxia.  ONO: 11/20/15-shows desaturations.  PMH: Hypertension Heart murmur Sleep apnea COPD Glaucoma Hyperlipidemia Chronic kidney disease stage IV History of diabetes mellitus, now resolved. Retinal vein occlusion.  Meds: Aspirin Bumex Isosorbide mononitrate Simvastatin Spiriva Mirtazapine Potassium chloride Vitamin B12 Metoprolol succinate ER Lab the process Probiotic, biotin, MVI Spiriva  Social history: She is a never smoker, no alcohol use, no recreational drug use she is widowed. 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 There is no exposures at work or at home.   Review of Systems Has dyspnea on exertion. Denies any cough, sputum production, fevers, chills, hemoptysis. Denies any chest pain, palpitations. Denies any fevers, chills, malaise, loss of weight or appetite. All  other review of systems are negative.    Objective:   Physical Exam Blood pressure 122/74, pulse 64, height 5\' 2"  (1.575 m), weight 200 lb 9.6 oz (90.992 kg), SpO2 92 %. Gen.: Pleasant elderly female. No apparent distress Neuro: No gross focal deficits. Neck: No JVD, lymphadenopathy, thyromegaly. RS: Clear. No wheeze or crackles. Nonlabored breathing. CVS: S1-S2 heard, no murmurs rubs gallops. Abdomen: Soft, positive bowel sounds. Extremities: No edema.    Assessment & Plan:  #1 COPD. PFTs show very minimal obstruction. I'm not entirely sure she has emphysema as she is a lifelong nonsmoker with no known exposures. I discussed stopping the Spiriva but she feels that it helps with her symptoms so she'll continue on the same for now.  #2 OSA She is on CPAP with 2 L oxygen at night. The sleep study was done in [place] 5 years ago. A repeat sleep study in the sleep center did not show any significant OSA however she slept for less than 2 hours hence I feel this study was not reliable. She may have a problem getting a new machine as the insurance company wants new sleep study. I discussed this with her daughter and the patient. We have decided to continue her current CPAP machine for now as it appears to be working well. If it breaks down and we have to get a new machine then we may order a home sleep study for a more reliable test.  PLAN: - Continue CPAP with home O2 - Continue Spiriva.  Marshell Garfinkel MD  Pulmonary and Critical Care Pager [phone] If no answer or after  3pm call: [phone] 01/30/2016, 2:55 PM

## 2016-01-31 DIAGNOSIS — R29898 Other symptoms and signs involving the musculoskeletal system: Secondary | ICD-10-CM | POA: Diagnosis not present

## 2016-01-31 DIAGNOSIS — R2681 Unsteadiness on feet: Secondary | ICD-10-CM | POA: Diagnosis not present

## 2016-01-31 DIAGNOSIS — M6281 Muscle weakness (generalized): Secondary | ICD-10-CM | POA: Diagnosis not present

## 2016-01-31 DIAGNOSIS — R29818 Other symptoms and signs involving the nervous system: Secondary | ICD-10-CM | POA: Diagnosis not present

## 2016-02-04 DIAGNOSIS — R2681 Unsteadiness on feet: Secondary | ICD-10-CM | POA: Diagnosis not present

## 2016-02-04 DIAGNOSIS — M6281 Muscle weakness (generalized): Secondary | ICD-10-CM | POA: Diagnosis not present

## 2016-02-04 DIAGNOSIS — R29818 Other symptoms and signs involving the nervous system: Secondary | ICD-10-CM | POA: Diagnosis not present

## 2016-02-04 DIAGNOSIS — R29898 Other symptoms and signs involving the musculoskeletal system: Secondary | ICD-10-CM | POA: Diagnosis not present

## 2016-02-05 DIAGNOSIS — R29898 Other symptoms and signs involving the musculoskeletal system: Secondary | ICD-10-CM | POA: Diagnosis not present

## 2016-02-05 DIAGNOSIS — R2681 Unsteadiness on feet: Secondary | ICD-10-CM | POA: Diagnosis not present

## 2016-02-05 DIAGNOSIS — R29818 Other symptoms and signs involving the nervous system: Secondary | ICD-10-CM | POA: Diagnosis not present

## 2016-02-05 DIAGNOSIS — M6281 Muscle weakness (generalized): Secondary | ICD-10-CM | POA: Diagnosis not present

## 2016-02-06 DIAGNOSIS — R29898 Other symptoms and signs involving the musculoskeletal system: Secondary | ICD-10-CM | POA: Diagnosis not present

## 2016-02-06 DIAGNOSIS — M6281 Muscle weakness (generalized): Secondary | ICD-10-CM | POA: Diagnosis not present

## 2016-02-06 DIAGNOSIS — R29818 Other symptoms and signs involving the nervous system: Secondary | ICD-10-CM | POA: Diagnosis not present

## 2016-02-06 DIAGNOSIS — R2681 Unsteadiness on feet: Secondary | ICD-10-CM | POA: Diagnosis not present

## 2016-02-07 ENCOUNTER — Ambulatory Visit: Payer: Medicare Other | Admitting: Emergency Medicine

## 2016-02-07 DIAGNOSIS — R29818 Other symptoms and signs involving the nervous system: Secondary | ICD-10-CM | POA: Diagnosis not present

## 2016-02-07 DIAGNOSIS — R2681 Unsteadiness on feet: Secondary | ICD-10-CM | POA: Diagnosis not present

## 2016-02-07 DIAGNOSIS — R29898 Other symptoms and signs involving the musculoskeletal system: Secondary | ICD-10-CM | POA: Diagnosis not present

## 2016-02-07 DIAGNOSIS — M6281 Muscle weakness (generalized): Secondary | ICD-10-CM | POA: Diagnosis not present

## 2016-02-11 DIAGNOSIS — M6281 Muscle weakness (generalized): Secondary | ICD-10-CM | POA: Diagnosis not present

## 2016-02-11 DIAGNOSIS — R29818 Other symptoms and signs involving the nervous system: Secondary | ICD-10-CM | POA: Diagnosis not present

## 2016-02-11 DIAGNOSIS — R2681 Unsteadiness on feet: Secondary | ICD-10-CM | POA: Diagnosis not present

## 2016-02-11 DIAGNOSIS — R29898 Other symptoms and signs involving the musculoskeletal system: Secondary | ICD-10-CM | POA: Diagnosis not present

## 2016-02-13 DIAGNOSIS — M6281 Muscle weakness (generalized): Secondary | ICD-10-CM | POA: Diagnosis not present

## 2016-02-13 DIAGNOSIS — R29818 Other symptoms and signs involving the nervous system: Secondary | ICD-10-CM | POA: Diagnosis not present

## 2016-02-13 DIAGNOSIS — R2681 Unsteadiness on feet: Secondary | ICD-10-CM | POA: Diagnosis not present

## 2016-02-13 DIAGNOSIS — R29898 Other symptoms and signs involving the musculoskeletal system: Secondary | ICD-10-CM | POA: Diagnosis not present

## 2016-02-25 DIAGNOSIS — R011 Cardiac murmur, unspecified: Secondary | ICD-10-CM | POA: Diagnosis not present

## 2016-03-11 DIAGNOSIS — N184 Chronic kidney disease, stage 4 (severe): Secondary | ICD-10-CM | POA: Diagnosis not present

## 2016-03-11 DIAGNOSIS — N2581 Secondary hyperparathyroidism of renal origin: Secondary | ICD-10-CM | POA: Diagnosis not present

## 2016-03-11 DIAGNOSIS — D631 Anemia in chronic kidney disease: Secondary | ICD-10-CM | POA: Diagnosis not present

## 2016-03-11 DIAGNOSIS — I129 Hypertensive chronic kidney disease with stage 1 through stage 4 chronic kidney disease, or unspecified chronic kidney disease: Secondary | ICD-10-CM | POA: Diagnosis not present

## 2016-03-19 DIAGNOSIS — E113413 Type 2 diabetes mellitus with severe nonproliferative diabetic retinopathy with macular edema, bilateral: Secondary | ICD-10-CM | POA: Diagnosis not present

## 2016-03-19 DIAGNOSIS — H353132 Nonexudative age-related macular degeneration, bilateral, intermediate dry stage: Secondary | ICD-10-CM | POA: Diagnosis not present

## 2016-03-19 DIAGNOSIS — D3132 Benign neoplasm of left choroid: Secondary | ICD-10-CM | POA: Diagnosis not present

## 2016-03-19 DIAGNOSIS — H43813 Vitreous degeneration, bilateral: Secondary | ICD-10-CM | POA: Diagnosis not present

## 2016-03-19 DIAGNOSIS — H35372 Puckering of macula, left eye: Secondary | ICD-10-CM | POA: Diagnosis not present

## 2016-04-01 DIAGNOSIS — N184 Chronic kidney disease, stage 4 (severe): Secondary | ICD-10-CM | POA: Diagnosis not present

## 2016-04-02 DIAGNOSIS — R262 Difficulty in walking, not elsewhere classified: Secondary | ICD-10-CM | POA: Diagnosis not present

## 2016-04-02 DIAGNOSIS — M25561 Pain in right knee: Secondary | ICD-10-CM | POA: Diagnosis not present

## 2016-04-02 DIAGNOSIS — M6281 Muscle weakness (generalized): Secondary | ICD-10-CM | POA: Diagnosis not present

## 2016-04-02 DIAGNOSIS — M79671 Pain in right foot: Secondary | ICD-10-CM | POA: Diagnosis not present

## 2016-04-03 DIAGNOSIS — M25561 Pain in right knee: Secondary | ICD-10-CM | POA: Diagnosis not present

## 2016-04-03 DIAGNOSIS — M79671 Pain in right foot: Secondary | ICD-10-CM | POA: Diagnosis not present

## 2016-04-03 DIAGNOSIS — R262 Difficulty in walking, not elsewhere classified: Secondary | ICD-10-CM | POA: Diagnosis not present

## 2016-04-03 DIAGNOSIS — M6281 Muscle weakness (generalized): Secondary | ICD-10-CM | POA: Diagnosis not present

## 2016-04-04 DIAGNOSIS — M25561 Pain in right knee: Secondary | ICD-10-CM | POA: Diagnosis not present

## 2016-04-04 DIAGNOSIS — M79671 Pain in right foot: Secondary | ICD-10-CM | POA: Diagnosis not present

## 2016-04-04 DIAGNOSIS — R262 Difficulty in walking, not elsewhere classified: Secondary | ICD-10-CM | POA: Diagnosis not present

## 2016-04-04 DIAGNOSIS — M6281 Muscle weakness (generalized): Secondary | ICD-10-CM | POA: Diagnosis not present

## 2016-04-07 DIAGNOSIS — M25561 Pain in right knee: Secondary | ICD-10-CM | POA: Diagnosis not present

## 2016-04-07 DIAGNOSIS — M79671 Pain in right foot: Secondary | ICD-10-CM | POA: Diagnosis not present

## 2016-04-07 DIAGNOSIS — R262 Difficulty in walking, not elsewhere classified: Secondary | ICD-10-CM | POA: Diagnosis not present

## 2016-04-07 DIAGNOSIS — M6281 Muscle weakness (generalized): Secondary | ICD-10-CM | POA: Diagnosis not present

## 2016-04-08 DIAGNOSIS — M25561 Pain in right knee: Secondary | ICD-10-CM | POA: Diagnosis not present

## 2016-04-08 DIAGNOSIS — R262 Difficulty in walking, not elsewhere classified: Secondary | ICD-10-CM | POA: Diagnosis not present

## 2016-04-08 DIAGNOSIS — M6281 Muscle weakness (generalized): Secondary | ICD-10-CM | POA: Diagnosis not present

## 2016-04-08 DIAGNOSIS — M79671 Pain in right foot: Secondary | ICD-10-CM | POA: Diagnosis not present

## 2016-04-09 DIAGNOSIS — M25561 Pain in right knee: Secondary | ICD-10-CM | POA: Diagnosis not present

## 2016-04-09 DIAGNOSIS — R262 Difficulty in walking, not elsewhere classified: Secondary | ICD-10-CM | POA: Diagnosis not present

## 2016-04-09 DIAGNOSIS — M79671 Pain in right foot: Secondary | ICD-10-CM | POA: Diagnosis not present

## 2016-04-09 DIAGNOSIS — M6281 Muscle weakness (generalized): Secondary | ICD-10-CM | POA: Diagnosis not present

## 2016-04-11 DIAGNOSIS — R262 Difficulty in walking, not elsewhere classified: Secondary | ICD-10-CM | POA: Diagnosis not present

## 2016-04-11 DIAGNOSIS — M25561 Pain in right knee: Secondary | ICD-10-CM | POA: Diagnosis not present

## 2016-04-11 DIAGNOSIS — M6281 Muscle weakness (generalized): Secondary | ICD-10-CM | POA: Diagnosis not present

## 2016-04-11 DIAGNOSIS — M79671 Pain in right foot: Secondary | ICD-10-CM | POA: Diagnosis not present

## 2016-04-14 DIAGNOSIS — R262 Difficulty in walking, not elsewhere classified: Secondary | ICD-10-CM | POA: Diagnosis not present

## 2016-04-14 DIAGNOSIS — M25561 Pain in right knee: Secondary | ICD-10-CM | POA: Diagnosis not present

## 2016-04-14 DIAGNOSIS — M79671 Pain in right foot: Secondary | ICD-10-CM | POA: Diagnosis not present

## 2016-04-14 DIAGNOSIS — M6281 Muscle weakness (generalized): Secondary | ICD-10-CM | POA: Diagnosis not present

## 2016-04-15 DIAGNOSIS — M79671 Pain in right foot: Secondary | ICD-10-CM | POA: Diagnosis not present

## 2016-04-15 DIAGNOSIS — R262 Difficulty in walking, not elsewhere classified: Secondary | ICD-10-CM | POA: Diagnosis not present

## 2016-04-15 DIAGNOSIS — M25561 Pain in right knee: Secondary | ICD-10-CM | POA: Diagnosis not present

## 2016-04-15 DIAGNOSIS — M6281 Muscle weakness (generalized): Secondary | ICD-10-CM | POA: Diagnosis not present

## 2016-04-16 DIAGNOSIS — M79671 Pain in right foot: Secondary | ICD-10-CM | POA: Diagnosis not present

## 2016-04-16 DIAGNOSIS — M25561 Pain in right knee: Secondary | ICD-10-CM | POA: Diagnosis not present

## 2016-04-16 DIAGNOSIS — R262 Difficulty in walking, not elsewhere classified: Secondary | ICD-10-CM | POA: Diagnosis not present

## 2016-04-16 DIAGNOSIS — M6281 Muscle weakness (generalized): Secondary | ICD-10-CM | POA: Diagnosis not present

## 2016-04-21 DIAGNOSIS — R262 Difficulty in walking, not elsewhere classified: Secondary | ICD-10-CM | POA: Diagnosis not present

## 2016-04-21 DIAGNOSIS — M6281 Muscle weakness (generalized): Secondary | ICD-10-CM | POA: Diagnosis not present

## 2016-04-21 DIAGNOSIS — M79671 Pain in right foot: Secondary | ICD-10-CM | POA: Diagnosis not present

## 2016-04-21 DIAGNOSIS — M25561 Pain in right knee: Secondary | ICD-10-CM | POA: Diagnosis not present

## 2016-04-23 DIAGNOSIS — R262 Difficulty in walking, not elsewhere classified: Secondary | ICD-10-CM | POA: Diagnosis not present

## 2016-04-23 DIAGNOSIS — M25561 Pain in right knee: Secondary | ICD-10-CM | POA: Diagnosis not present

## 2016-04-23 DIAGNOSIS — M6281 Muscle weakness (generalized): Secondary | ICD-10-CM | POA: Diagnosis not present

## 2016-04-23 DIAGNOSIS — M79671 Pain in right foot: Secondary | ICD-10-CM | POA: Diagnosis not present

## 2016-04-24 DIAGNOSIS — R262 Difficulty in walking, not elsewhere classified: Secondary | ICD-10-CM | POA: Diagnosis not present

## 2016-04-24 DIAGNOSIS — M6281 Muscle weakness (generalized): Secondary | ICD-10-CM | POA: Diagnosis not present

## 2016-04-24 DIAGNOSIS — M25561 Pain in right knee: Secondary | ICD-10-CM | POA: Diagnosis not present

## 2016-04-24 DIAGNOSIS — M79671 Pain in right foot: Secondary | ICD-10-CM | POA: Diagnosis not present

## 2016-04-28 DIAGNOSIS — R262 Difficulty in walking, not elsewhere classified: Secondary | ICD-10-CM | POA: Diagnosis not present

## 2016-04-28 DIAGNOSIS — M79671 Pain in right foot: Secondary | ICD-10-CM | POA: Diagnosis not present

## 2016-04-28 DIAGNOSIS — M6281 Muscle weakness (generalized): Secondary | ICD-10-CM | POA: Diagnosis not present

## 2016-04-28 DIAGNOSIS — M25561 Pain in right knee: Secondary | ICD-10-CM | POA: Diagnosis not present

## 2016-04-29 DIAGNOSIS — M79671 Pain in right foot: Secondary | ICD-10-CM | POA: Diagnosis not present

## 2016-04-29 DIAGNOSIS — R262 Difficulty in walking, not elsewhere classified: Secondary | ICD-10-CM | POA: Diagnosis not present

## 2016-04-29 DIAGNOSIS — M25561 Pain in right knee: Secondary | ICD-10-CM | POA: Diagnosis not present

## 2016-04-29 DIAGNOSIS — M6281 Muscle weakness (generalized): Secondary | ICD-10-CM | POA: Diagnosis not present

## 2016-04-30 DIAGNOSIS — M25561 Pain in right knee: Secondary | ICD-10-CM | POA: Diagnosis not present

## 2016-04-30 DIAGNOSIS — M6281 Muscle weakness (generalized): Secondary | ICD-10-CM | POA: Diagnosis not present

## 2016-04-30 DIAGNOSIS — M79671 Pain in right foot: Secondary | ICD-10-CM | POA: Diagnosis not present

## 2016-04-30 DIAGNOSIS — R262 Difficulty in walking, not elsewhere classified: Secondary | ICD-10-CM | POA: Diagnosis not present

## 2016-05-01 DIAGNOSIS — M6281 Muscle weakness (generalized): Secondary | ICD-10-CM | POA: Diagnosis not present

## 2016-05-01 DIAGNOSIS — M25561 Pain in right knee: Secondary | ICD-10-CM | POA: Diagnosis not present

## 2016-05-01 DIAGNOSIS — M79671 Pain in right foot: Secondary | ICD-10-CM | POA: Diagnosis not present

## 2016-05-01 DIAGNOSIS — R262 Difficulty in walking, not elsewhere classified: Secondary | ICD-10-CM | POA: Diagnosis not present

## 2016-05-06 DIAGNOSIS — N184 Chronic kidney disease, stage 4 (severe): Secondary | ICD-10-CM | POA: Diagnosis not present

## 2016-05-13 DIAGNOSIS — N184 Chronic kidney disease, stage 4 (severe): Secondary | ICD-10-CM | POA: Diagnosis not present

## 2016-05-13 DIAGNOSIS — I129 Hypertensive chronic kidney disease with stage 1 through stage 4 chronic kidney disease, or unspecified chronic kidney disease: Secondary | ICD-10-CM | POA: Diagnosis not present

## 2016-05-13 DIAGNOSIS — D631 Anemia in chronic kidney disease: Secondary | ICD-10-CM | POA: Diagnosis not present

## 2016-05-13 DIAGNOSIS — N2581 Secondary hyperparathyroidism of renal origin: Secondary | ICD-10-CM | POA: Diagnosis not present

## 2016-05-20 DIAGNOSIS — E119 Type 2 diabetes mellitus without complications: Secondary | ICD-10-CM | POA: Diagnosis not present

## 2016-05-29 DIAGNOSIS — I119 Hypertensive heart disease without heart failure: Secondary | ICD-10-CM | POA: Diagnosis not present

## 2016-05-29 DIAGNOSIS — J449 Chronic obstructive pulmonary disease, unspecified: Secondary | ICD-10-CM | POA: Diagnosis not present

## 2016-05-29 DIAGNOSIS — E119 Type 2 diabetes mellitus without complications: Secondary | ICD-10-CM | POA: Diagnosis not present

## 2016-05-29 DIAGNOSIS — R011 Cardiac murmur, unspecified: Secondary | ICD-10-CM | POA: Diagnosis not present

## 2016-05-29 DIAGNOSIS — E785 Hyperlipidemia, unspecified: Secondary | ICD-10-CM | POA: Diagnosis not present

## 2016-05-29 DIAGNOSIS — N184 Chronic kidney disease, stage 4 (severe): Secondary | ICD-10-CM | POA: Diagnosis not present

## 2016-05-29 DIAGNOSIS — R0602 Shortness of breath: Secondary | ICD-10-CM | POA: Diagnosis not present

## 2016-05-29 DIAGNOSIS — I350 Nonrheumatic aortic (valve) stenosis: Secondary | ICD-10-CM | POA: Diagnosis not present

## 2016-05-29 DIAGNOSIS — E668 Other obesity: Secondary | ICD-10-CM | POA: Diagnosis not present

## 2016-07-11 DIAGNOSIS — M25561 Pain in right knee: Secondary | ICD-10-CM | POA: Diagnosis not present

## 2016-07-11 DIAGNOSIS — M6281 Muscle weakness (generalized): Secondary | ICD-10-CM | POA: Diagnosis not present

## 2016-07-11 DIAGNOSIS — R262 Difficulty in walking, not elsewhere classified: Secondary | ICD-10-CM | POA: Diagnosis not present

## 2016-07-14 DIAGNOSIS — M6281 Muscle weakness (generalized): Secondary | ICD-10-CM | POA: Diagnosis not present

## 2016-07-14 DIAGNOSIS — M25561 Pain in right knee: Secondary | ICD-10-CM | POA: Diagnosis not present

## 2016-07-14 DIAGNOSIS — R262 Difficulty in walking, not elsewhere classified: Secondary | ICD-10-CM | POA: Diagnosis not present

## 2016-07-16 DIAGNOSIS — R262 Difficulty in walking, not elsewhere classified: Secondary | ICD-10-CM | POA: Diagnosis not present

## 2016-07-16 DIAGNOSIS — M25561 Pain in right knee: Secondary | ICD-10-CM | POA: Diagnosis not present

## 2016-07-16 DIAGNOSIS — M6281 Muscle weakness (generalized): Secondary | ICD-10-CM | POA: Diagnosis not present

## 2016-07-23 DIAGNOSIS — M25561 Pain in right knee: Secondary | ICD-10-CM | POA: Diagnosis not present

## 2016-07-23 DIAGNOSIS — R262 Difficulty in walking, not elsewhere classified: Secondary | ICD-10-CM | POA: Diagnosis not present

## 2016-07-23 DIAGNOSIS — M6281 Muscle weakness (generalized): Secondary | ICD-10-CM | POA: Diagnosis not present

## 2016-07-25 DIAGNOSIS — M6281 Muscle weakness (generalized): Secondary | ICD-10-CM | POA: Diagnosis not present

## 2016-07-25 DIAGNOSIS — M25561 Pain in right knee: Secondary | ICD-10-CM | POA: Diagnosis not present

## 2016-07-25 DIAGNOSIS — R262 Difficulty in walking, not elsewhere classified: Secondary | ICD-10-CM | POA: Diagnosis not present

## 2016-07-28 ENCOUNTER — Encounter (INDEPENDENT_AMBULATORY_CARE_PROVIDER_SITE_OTHER): Payer: Self-pay

## 2016-07-28 ENCOUNTER — Encounter: Payer: Self-pay | Admitting: Pulmonary Disease

## 2016-07-28 ENCOUNTER — Ambulatory Visit (INDEPENDENT_AMBULATORY_CARE_PROVIDER_SITE_OTHER): Payer: Medicare Other | Admitting: Pulmonary Disease

## 2016-07-28 VITALS — BP 130/78 | HR 58 | Ht 63.0 in | Wt 200.8 lb

## 2016-07-28 DIAGNOSIS — J439 Emphysema, unspecified: Secondary | ICD-10-CM

## 2016-07-28 DIAGNOSIS — G4733 Obstructive sleep apnea (adult) (pediatric): Secondary | ICD-10-CM | POA: Diagnosis not present

## 2016-07-28 NOTE — Patient Instructions (Signed)
Continue using the Spiriva and the CPAP. We will order a home sleep study. Please remember that you should not use the CPAP while getting the study performed.  Return to clinic in 6 months.

## 2016-07-28 NOTE — Progress Notes (Signed)
Subjective:    Patient ID: Kathryn Peck, female    DOB: 20-Nov-1928, 82 y.o.   MRN: [id number]  HPI Follow up for COPD, sleep apnea.  Kathryn Peck is a 82 year old with history of hypertension, hyperlipidemia, chronic kidney disease, COPD, sleep apnea. She recently moved here last year from [place] into [place]. She has a diagnosis of COPD but is a never smoker. She is on Spiriva. She has some dyspnea on exertion but it does not appear to bother her much. She denies any cough, sputum production, wheezing, hemoptysis.   She also has a diagnosis of OSA and is on CPAP for the past 4-5 years. She does not know what pressure settings she is on. She is on nocturnal oxygen via CPAP at 2 LPM.  DATA: PFTs 01/30/16 FVC 1.86 [87%) FEV1 1.33 [85%) F/F 71 TLC 90% DLCO 59% Minimal obstructive disease, moderate reduction in DLCO which however corrects for alveolar volume.  Sleep study 01/16/16 Study did not show significant sleep-disordered breathing however the sleep time was pretty low. Study was performed on 2 L O2 and did not show any hypoxia.  ONO: 11/20/15-shows desaturations.  PMH: Hypertension Heart murmur Sleep apnea COPD Glaucoma Hyperlipidemia Chronic kidney disease stage IV History of diabetes mellitus, now resolved. Retinal vein occlusion.  Meds: Aspirin Bumex Isosorbide mononitrate Simvastatin Spiriva Mirtazapine Potassium chloride Vitamin B12 Metoprolol succinate ER Lab the process Probiotic, biotin, MVI Spiriva  Social history: She is a never smoker, no alcohol use, no recreational drug use she is widowed. 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 There is no exposures at work or at home.   Review of Systems Has dyspnea on exertion. Denies any cough, sputum production, fevers, chills, hemoptysis. Denies any chest pain, palpitations. Denies any fevers, chills, malaise, loss of weight or appetite. All  other review of systems are negative.    Objective:   Physical Exam Blood pressure 130/78, pulse (!) 58, height 5\' 3"  (1.6 m), weight 200 lb 12.8 oz (91.1 kg), SpO2 99 %. Gen.: Pleasant elderly female. No apparent distress Neuro: No gross focal deficits. Neck: No JVD, lymphadenopathy, thyromegaly. RS: Clear. No wheeze or crackles. Nonlabored breathing. CVS: S1-S2 heard, no murmurs rubs gallops. Abdomen: Soft, positive bowel sounds. Extremities: No edema.    Assessment & Plan:  #1 COPD. PFTs show very minimal obstruction. I'm not entirely sure she has emphysema as she is a lifelong nonsmoker with no known exposures. I discussed stopping the Spiriva but she feels that it helps with her symptoms so she'll continue on the same for now.  #2 OSA She is on CPAP with 2 L oxygen at night. The sleep study was done in [place] 5 years ago. A repeat sleep study in the sleep center did not show any significant OSA however she slept for less than 2 hours hence I feel this study was not reliable. She may have a problem getting a new machine as the insurance company wants new sleep study. I discussed this with her daughter and the patient. We have decided to continue her current CPAP machine for now  She is concerned that the mask fitting is not good now. I will order a home sleep study to document OSA so she can get supplies. If that fails then she will have to pay out of pocket.   PLAN: - Continue CPAP with home O2 - Continue Spiriva. - Home sleep study.  Marshell Garfinkel MD [place]  [facility] Pager [phone] If no answer or after 3pm call: [phone] 07/28/2016, 11:03 AM

## 2016-07-28 NOTE — Addendum Note (Signed)
Addended by: Parke Poisson E on: 07/28/2016 05:20 PM   Modules accepted: Orders

## 2016-07-29 DIAGNOSIS — M25561 Pain in right knee: Secondary | ICD-10-CM | POA: Diagnosis not present

## 2016-07-29 DIAGNOSIS — M6281 Muscle weakness (generalized): Secondary | ICD-10-CM | POA: Diagnosis not present

## 2016-07-29 DIAGNOSIS — R262 Difficulty in walking, not elsewhere classified: Secondary | ICD-10-CM | POA: Diagnosis not present

## 2016-07-30 DIAGNOSIS — M6281 Muscle weakness (generalized): Secondary | ICD-10-CM | POA: Diagnosis not present

## 2016-07-30 DIAGNOSIS — M25561 Pain in right knee: Secondary | ICD-10-CM | POA: Diagnosis not present

## 2016-07-30 DIAGNOSIS — R262 Difficulty in walking, not elsewhere classified: Secondary | ICD-10-CM | POA: Diagnosis not present

## 2016-07-31 ENCOUNTER — Telehealth: Payer: Self-pay | Admitting: Pulmonary Disease

## 2016-07-31 DIAGNOSIS — M25561 Pain in right knee: Secondary | ICD-10-CM | POA: Diagnosis not present

## 2016-07-31 DIAGNOSIS — R262 Difficulty in walking, not elsewhere classified: Secondary | ICD-10-CM | POA: Diagnosis not present

## 2016-07-31 DIAGNOSIS — M6281 Muscle weakness (generalized): Secondary | ICD-10-CM | POA: Diagnosis not present

## 2016-07-31 NOTE — Telephone Encounter (Signed)
Called and spoke to pt's daughter, Marliss Coots. Informed her of the recs per PM. Belinda verbalized understanding and denied any further questions or concerns at this time.

## 2016-07-31 NOTE — Telephone Encounter (Addendum)
Called spoke with Biatriz (pt daughter). She reports pt had PT yesterday. At O2 sats at 97% Pt was walked about 60 ft and O2 dropped to 93% RA. Pt rested and rechecked about 3-4 minutes later and was 97%RA. Pt then was walked 100 ft and pt O2 dropped too 91%RA. Pt rested for about 4-5 minutes and recovered to 97% RA. I advised daughter these number are still good on RA. Anything 90% or above is considered normal. I advised will let Dr. Vaughan Browner know. Pt only uses 02 at night with CPAP. Daughter wants to know if they should be concerned? Please advise Dr. Vaughan Browner thanks

## 2016-07-31 NOTE — Telephone Encounter (Signed)
ATC received fast busy signal x 3 WCB 

## 2016-07-31 NOTE — Telephone Encounter (Signed)
OK if the sats remain above 90. Continue to monitor.

## 2016-08-04 DIAGNOSIS — R262 Difficulty in walking, not elsewhere classified: Secondary | ICD-10-CM | POA: Diagnosis not present

## 2016-08-04 DIAGNOSIS — M6281 Muscle weakness (generalized): Secondary | ICD-10-CM | POA: Diagnosis not present

## 2016-08-04 DIAGNOSIS — M25561 Pain in right knee: Secondary | ICD-10-CM | POA: Diagnosis not present

## 2016-08-06 DIAGNOSIS — R262 Difficulty in walking, not elsewhere classified: Secondary | ICD-10-CM | POA: Diagnosis not present

## 2016-08-06 DIAGNOSIS — M25561 Pain in right knee: Secondary | ICD-10-CM | POA: Diagnosis not present

## 2016-08-06 DIAGNOSIS — M6281 Muscle weakness (generalized): Secondary | ICD-10-CM | POA: Diagnosis not present

## 2016-08-07 DIAGNOSIS — M25561 Pain in right knee: Secondary | ICD-10-CM | POA: Diagnosis not present

## 2016-08-07 DIAGNOSIS — M6281 Muscle weakness (generalized): Secondary | ICD-10-CM | POA: Diagnosis not present

## 2016-08-07 DIAGNOSIS — R262 Difficulty in walking, not elsewhere classified: Secondary | ICD-10-CM | POA: Diagnosis not present

## 2016-08-08 DIAGNOSIS — H353132 Nonexudative age-related macular degeneration, bilateral, intermediate dry stage: Secondary | ICD-10-CM | POA: Diagnosis not present

## 2016-08-08 DIAGNOSIS — H43813 Vitreous degeneration, bilateral: Secondary | ICD-10-CM | POA: Diagnosis not present

## 2016-08-08 DIAGNOSIS — H35372 Puckering of macula, left eye: Secondary | ICD-10-CM | POA: Diagnosis not present

## 2016-08-08 DIAGNOSIS — D3132 Benign neoplasm of left choroid: Secondary | ICD-10-CM | POA: Diagnosis not present

## 2016-08-11 DIAGNOSIS — R262 Difficulty in walking, not elsewhere classified: Secondary | ICD-10-CM | POA: Diagnosis not present

## 2016-08-11 DIAGNOSIS — M25561 Pain in right knee: Secondary | ICD-10-CM | POA: Diagnosis not present

## 2016-08-11 DIAGNOSIS — M6281 Muscle weakness (generalized): Secondary | ICD-10-CM | POA: Diagnosis not present

## 2016-08-12 DIAGNOSIS — D631 Anemia in chronic kidney disease: Secondary | ICD-10-CM | POA: Diagnosis not present

## 2016-08-12 DIAGNOSIS — N2581 Secondary hyperparathyroidism of renal origin: Secondary | ICD-10-CM | POA: Diagnosis not present

## 2016-08-12 DIAGNOSIS — N184 Chronic kidney disease, stage 4 (severe): Secondary | ICD-10-CM | POA: Diagnosis not present

## 2016-08-13 DIAGNOSIS — M25561 Pain in right knee: Secondary | ICD-10-CM | POA: Diagnosis not present

## 2016-08-13 DIAGNOSIS — M6281 Muscle weakness (generalized): Secondary | ICD-10-CM | POA: Diagnosis not present

## 2016-08-13 DIAGNOSIS — R262 Difficulty in walking, not elsewhere classified: Secondary | ICD-10-CM | POA: Diagnosis not present

## 2016-08-15 DIAGNOSIS — R262 Difficulty in walking, not elsewhere classified: Secondary | ICD-10-CM | POA: Diagnosis not present

## 2016-08-15 DIAGNOSIS — M25561 Pain in right knee: Secondary | ICD-10-CM | POA: Diagnosis not present

## 2016-08-15 DIAGNOSIS — M6281 Muscle weakness (generalized): Secondary | ICD-10-CM | POA: Diagnosis not present

## 2016-08-20 DIAGNOSIS — M6281 Muscle weakness (generalized): Secondary | ICD-10-CM | POA: Diagnosis not present

## 2016-08-20 DIAGNOSIS — M25561 Pain in right knee: Secondary | ICD-10-CM | POA: Diagnosis not present

## 2016-08-20 DIAGNOSIS — R262 Difficulty in walking, not elsewhere classified: Secondary | ICD-10-CM | POA: Diagnosis not present

## 2016-08-22 DIAGNOSIS — E669 Obesity, unspecified: Secondary | ICD-10-CM | POA: Diagnosis not present

## 2016-08-22 DIAGNOSIS — D631 Anemia in chronic kidney disease: Secondary | ICD-10-CM | POA: Diagnosis not present

## 2016-08-22 DIAGNOSIS — M6281 Muscle weakness (generalized): Secondary | ICD-10-CM | POA: Diagnosis not present

## 2016-08-22 DIAGNOSIS — M25561 Pain in right knee: Secondary | ICD-10-CM | POA: Diagnosis not present

## 2016-08-22 DIAGNOSIS — R262 Difficulty in walking, not elsewhere classified: Secondary | ICD-10-CM | POA: Diagnosis not present

## 2016-08-22 DIAGNOSIS — N184 Chronic kidney disease, stage 4 (severe): Secondary | ICD-10-CM | POA: Diagnosis not present

## 2016-08-22 DIAGNOSIS — I129 Hypertensive chronic kidney disease with stage 1 through stage 4 chronic kidney disease, or unspecified chronic kidney disease: Secondary | ICD-10-CM | POA: Diagnosis not present

## 2016-08-22 DIAGNOSIS — N2581 Secondary hyperparathyroidism of renal origin: Secondary | ICD-10-CM | POA: Diagnosis not present

## 2016-08-26 DIAGNOSIS — M25561 Pain in right knee: Secondary | ICD-10-CM | POA: Diagnosis not present

## 2016-08-26 DIAGNOSIS — R262 Difficulty in walking, not elsewhere classified: Secondary | ICD-10-CM | POA: Diagnosis not present

## 2016-08-26 DIAGNOSIS — M6281 Muscle weakness (generalized): Secondary | ICD-10-CM | POA: Diagnosis not present

## 2016-08-27 DIAGNOSIS — M6281 Muscle weakness (generalized): Secondary | ICD-10-CM | POA: Diagnosis not present

## 2016-08-27 DIAGNOSIS — M25561 Pain in right knee: Secondary | ICD-10-CM | POA: Diagnosis not present

## 2016-08-27 DIAGNOSIS — R262 Difficulty in walking, not elsewhere classified: Secondary | ICD-10-CM | POA: Diagnosis not present

## 2016-09-04 DIAGNOSIS — G4733 Obstructive sleep apnea (adult) (pediatric): Secondary | ICD-10-CM | POA: Diagnosis not present

## 2016-09-05 ENCOUNTER — Other Ambulatory Visit: Payer: Self-pay | Admitting: *Deleted

## 2016-09-05 DIAGNOSIS — G4733 Obstructive sleep apnea (adult) (pediatric): Secondary | ICD-10-CM | POA: Diagnosis not present

## 2016-09-12 NOTE — Progress Notes (Signed)
Called spoke with patient's daughter Marliss Coots, advised of HST results / recs as stated by PM.  Belinda verbalized her understanding and denied any questions.  Orders only encounter created for order.

## 2016-09-24 ENCOUNTER — Telehealth: Payer: Self-pay | Admitting: Pulmonary Disease

## 2016-09-24 DIAGNOSIS — G4733 Obstructive sleep apnea (adult) (pediatric): Secondary | ICD-10-CM

## 2016-09-24 NOTE — Telephone Encounter (Signed)
LMTCB

## 2016-09-25 NOTE — Telephone Encounter (Signed)
ATC NA WCB 

## 2016-09-25 NOTE — Telephone Encounter (Signed)
Called spoke with [facility]. They states they spoke with the daughter yesterday at 4:30 and the pt is scheduled for CPAP set up.   Called spoke with pt. She states that the patient is suppose to be on oxygen with her CPAP but no order was placed for oxygen. I explained to her that I would send a message to PM for clarification and approval. She voiced understanding and had no further questions.   PM please advise

## 2016-09-25 NOTE — Telephone Encounter (Signed)
Patient daughter calling about oxygen order -pr

## 2016-09-26 NOTE — Telephone Encounter (Signed)
Order was placed Marliss Coots is aware There was some discussion about pt needing new O2 concentrator (she is from [place] and has no DME company to service the concentrator from [place]) but Marliss Coots is concerned about pt needing overnight testing to verify need for the O2.  Belinda agreed to continue with the O2 order of 2lpm through CPAP and see what insurance says.  Nothing further needed at this point.  Will sign off.

## 2016-09-26 NOTE — Telephone Encounter (Signed)
OK to add O2 order with CPAP as per her previous settings. Thanks

## 2016-09-30 ENCOUNTER — Telehealth: Payer: Self-pay | Admitting: Pulmonary Disease

## 2016-09-30 NOTE — Telephone Encounter (Signed)
I called and spoke with [place] from [facility].  He stated that the pt will need face to face prior to 10/21 due to medicare in order to get her set up.  I have attempted to contact the pt to set up appt but no answer and no VM.  Will call back.

## 2016-10-01 ENCOUNTER — Telehealth: Payer: Self-pay | Admitting: Pulmonary Disease

## 2016-10-01 NOTE — Telephone Encounter (Signed)
Order for CPAP is on auto 5-15.  Per Corene Cornea, he was incorrect in the information he gave earlier, he spoke with Respiratory Therapist and they advised him that patient can either do a CPAP Titration study or a qualifying walk in office, either way, patient will need to have a follow up OV within 30 days of the walk or study.    Patients daughter notified of above.  Scheduled for qualifying walk and OV on 10/08/16. Daughter aware of appointment. Nothing further needed.

## 2016-10-01 NOTE — Telephone Encounter (Signed)
Called and spoke with pts daughter and she stated that she spoke with [facility] and they scheduled the ONO for the pt on 10/21, then called back and she spoke with Amy and she rescheduled it for 10/24.  pts daughter told her that she was told that this had to be done by 10/21 and they told her that this was ok for this date.    Per our conversation yesterday, that she needed an OV with PM before 10/21.  pts daughter stated that she was never told this.    I have lmomtcb for Corene Cornea with [facility] to help clear this up.  PM is not here the rest of the week.

## 2016-10-01 NOTE — Telephone Encounter (Addendum)
Patient has medicare and has diagnosis of Sleep Apnea.  The only way medicare will pay for oxygen, they have to have sleep study within 30 days of OV.  They need to have the equipment in patient's home by 10/04/16 or she will have to do another sleep study.  Have to get patient in to see doctor/NP this week so they can get orders before 10/04/16.  Corene Cornea states he will confirm if ok for patient to see NP or another doctor other than [facility] and will call back to let me know.  Hold in Triage until hear back from [place].

## 2016-10-01 NOTE — Telephone Encounter (Signed)
Kathryn Peck with [facility] returned call.  Kathryn Peck's CB is [phone] ext [phone].

## 2016-10-01 NOTE — Telephone Encounter (Signed)
I spoke with pt's daughter Marliss Coots. When I advised her that the pt needed an appointment she became argumentative. She had several questions that I could not answer. Marliss Coots is going to call [facility] and speak with them before this appointment is made.

## 2016-10-08 ENCOUNTER — Ambulatory Visit (INDEPENDENT_AMBULATORY_CARE_PROVIDER_SITE_OTHER): Payer: Medicare Other | Admitting: Pulmonary Disease

## 2016-10-08 ENCOUNTER — Encounter: Payer: Self-pay | Admitting: Pulmonary Disease

## 2016-10-08 VITALS — BP 132/74 | HR 69 | Ht 62.0 in | Wt 207.8 lb

## 2016-10-08 DIAGNOSIS — G4730 Sleep apnea, unspecified: Secondary | ICD-10-CM | POA: Diagnosis not present

## 2016-10-08 NOTE — Progress Notes (Signed)
Kathryn Peck    [id number]    04-10-1928  Primary Care Physician:Victoria R Rankins, MD  Referring Physician: Aretta Nip, MD [place], [place] [address]  Chief complaint:   Follow up for COPD OSA  HPI: Kathryn Peck is a 82 year old with history of hypertension, hyperlipidemia, chronic kidney disease, COPD, sleep apnea. She recently moved here last year from [place] into [place]. She has a diagnosis of COPD but is a never smoker. She is on Spiriva. She has some dyspnea on exertion but it does not appear to bother her much. She denies any cough, sputum production, wheezing, hemoptysis.   She also has a diagnosis of OSA and is on CPAP for the past 4-5 years. She does not know what pressure settings she is on. She is on nocturnal oxygen via CPAP at 2 LPM.  Interim History: She had a home sleep study which demonstrated severe OSA with desaturation. She has been started on AutoSet CPAP. She started using this one night ago and is doing well on it. We tried to get her on supplemental oxygen as well but she requires further documentation of desaturations to get this approved.  Outpatient Encounter Prescriptions as of 10/08/2016  Medication Sig  . acetaminophen (TYLENOL) 325 MG tablet Take 650 mg by mouth every 6 (six) hours as needed.  Marland Kitchen amLODipine (NORVASC) 5 MG tablet Take 5 mg by mouth daily.  Marland Kitchen aspirin 81 MG tablet Take 81 mg by mouth daily.  . bumetanide (BUMEX) 1 MG tablet Take 1 mg by mouth every other day.   . isosorbide mononitrate (IMDUR) 30 MG 24 hr tablet Take 30 mg by mouth daily.  Marland Kitchen latanoprost (XALATAN) 0.005 % ophthalmic solution 1 drop at bedtime.  . metoprolol succinate (TOPROL-XL) 50 MG 24 hr tablet Take 50 mg by mouth daily. Take with or immediately following a meal.  . Misc Natural Products (OSTEO BI-FLEX JOINT SHIELD PO) Take 1 tablet by mouth 2 (two) times daily.  . Multiple Vitamins-Minerals (CENTRUM SILVER ULTRA WOMENS  PO) Take 1 tablet by mouth daily.  . potassium chloride SA (K-DUR,KLOR-CON) 20 MEQ tablet Take 20 mEq by mouth daily.   . Probiotic Product (PROBIOTIC COLON SUPPORT PO) Take by mouth.  . simvastatin (ZOCOR) 10 MG tablet Take 10 mg by mouth daily.  Marland Kitchen tiotropium (SPIRIVA) 18 MCG inhalation capsule Place 18 mcg into inhaler and inhale daily.  . [DISCONTINUED] Biotin 2500 MCG CAPS Take 1 tablet by mouth daily.   No facility-administered encounter medications on file as of 10/08/2016.     Allergies as of 10/08/2016 - Review Complete 10/08/2016  Allergen Reaction Noted  . Latex Itching 10/18/2015  . Zetia [ezetimibe] Other (See Comments) 10/18/2015  . Adhesive [tape] Rash 10/18/2015    No past medical history on file.  No past surgical history on file.  No family history on file.  Social History   Social History  . Marital status: Unknown    Spouse name: N/A  . Number of children: N/A  . Years of education: N/A   Occupational History  . Not on file.   Social History Main Topics  . Smoking status: Never Smoker  . Smokeless tobacco: Never Used  . Alcohol use Not on file  . Drug use: Unknown  . Sexual activity: Not on file   Other Topics Concern  . Not on file   Social History Narrative  . No narrative on file  Review of systems: Review of Systems  Constitutional: Negative for fever and chills.  HENT: Negative.   Eyes: Negative for blurred vision.  Respiratory: as per HPI  Cardiovascular: Negative for chest pain and palpitations.  Gastrointestinal: Negative for vomiting, diarrhea, blood per rectum. Genitourinary: Negative for dysuria, urgency, frequency and hematuria.  Musculoskeletal: Negative for myalgias, back pain and joint pain.  Skin: Negative for itching and rash.  Neurological: Negative for dizziness, tremors, focal weakness, seizures and loss of consciousness.  Endo/Heme/Allergies: Negative for environmental allergies.  Psychiatric/Behavioral: Negative  for depression, suicidal ideas and hallucinations.  All other systems reviewed and are negative.   Physical Exam: There were no vitals taken for this visit. Gen:      No acute distress HEENT:  EOMI, sclera anicteric Neck:     No masses; no thyromegaly Lungs:    Clear to auscultation bilaterally; normal respiratory effort CV:         Regular rate and rhythm; no murmurs Abd:      + bowel sounds; soft, non-tender; no palpable masses, no distension Ext:    No edema; adequate peripheral perfusion Skin:      Warm and dry; no rash Neuro: alert and oriented x 3 Psych: normal mood and affect  Data Reviewed: PFTs 01/30/16 FVC 1.86 [87%) FEV1 1.33 [85%) F/F 71 TLC 90% DLCO 59% Minimal obstructive disease, moderate reduction in DLCO which however corrects for alveolar volume.  Sleep study 01/16/16 Study did not show significant sleep-disordered breathing however the sleep time was pretty low. Study was performed on 2 L O2 and did not show any hypoxia.  Home sleep study 09/04/16 Severe OSA with desaturations to 81%. AHI 31.3,   Assessment:  #1 COPD. PFTs show very minimal obstruction. I'm not entirely sure she has emphysema as she is a lifelong nonsmoker with no known exposures. I discussed stopping the Spiriva but she feels that it helps with her symptoms so she'll continue on the same for now.  #2 OSA Home sleep study confirms sleep apnea and she was started on CPAP. However she was not approved for supplemental O2 that she was on in [place] before relocating to [place] [address]. She will need documentation of hypoxia to be covered by insurance. I will schedule for a overnight oximetry. If it shows desats on CPAP then we will order O2.   Plan/Recommendations: - Continue current CPAP - Schedule overnight oximetry  Marshell Garfinkel MD [place] [facility] Pager [phone] 10/08/2016, 11:52 AM  CC: Rankins, Bill Salinas, MD

## 2016-10-08 NOTE — Patient Instructions (Signed)
We'll schedule you for an overnight oximetry. Please remember that this test needs to be done on CPAP without any oxygen.  Return to clinic in 3 months.

## 2016-11-18 DIAGNOSIS — R2681 Unsteadiness on feet: Secondary | ICD-10-CM | POA: Diagnosis not present

## 2016-11-18 DIAGNOSIS — M6281 Muscle weakness (generalized): Secondary | ICD-10-CM | POA: Diagnosis not present

## 2016-11-18 DIAGNOSIS — D631 Anemia in chronic kidney disease: Secondary | ICD-10-CM | POA: Diagnosis not present

## 2016-11-18 DIAGNOSIS — N2581 Secondary hyperparathyroidism of renal origin: Secondary | ICD-10-CM | POA: Diagnosis not present

## 2016-11-18 DIAGNOSIS — N184 Chronic kidney disease, stage 4 (severe): Secondary | ICD-10-CM | POA: Diagnosis not present

## 2016-11-19 DIAGNOSIS — M6281 Muscle weakness (generalized): Secondary | ICD-10-CM | POA: Diagnosis not present

## 2016-11-19 DIAGNOSIS — R2681 Unsteadiness on feet: Secondary | ICD-10-CM | POA: Diagnosis not present

## 2016-11-20 DIAGNOSIS — M6281 Muscle weakness (generalized): Secondary | ICD-10-CM | POA: Diagnosis not present

## 2016-11-20 DIAGNOSIS — R2681 Unsteadiness on feet: Secondary | ICD-10-CM | POA: Diagnosis not present

## 2016-11-24 DIAGNOSIS — N2581 Secondary hyperparathyroidism of renal origin: Secondary | ICD-10-CM | POA: Diagnosis not present

## 2016-11-24 DIAGNOSIS — D631 Anemia in chronic kidney disease: Secondary | ICD-10-CM | POA: Diagnosis not present

## 2016-11-24 DIAGNOSIS — I129 Hypertensive chronic kidney disease with stage 1 through stage 4 chronic kidney disease, or unspecified chronic kidney disease: Secondary | ICD-10-CM | POA: Diagnosis not present

## 2016-11-24 DIAGNOSIS — N184 Chronic kidney disease, stage 4 (severe): Secondary | ICD-10-CM | POA: Diagnosis not present

## 2016-11-25 DIAGNOSIS — M6281 Muscle weakness (generalized): Secondary | ICD-10-CM | POA: Diagnosis not present

## 2016-11-25 DIAGNOSIS — R2681 Unsteadiness on feet: Secondary | ICD-10-CM | POA: Diagnosis not present

## 2016-11-27 DIAGNOSIS — M6281 Muscle weakness (generalized): Secondary | ICD-10-CM | POA: Diagnosis not present

## 2016-11-27 DIAGNOSIS — R2681 Unsteadiness on feet: Secondary | ICD-10-CM | POA: Diagnosis not present

## 2016-11-28 DIAGNOSIS — R2681 Unsteadiness on feet: Secondary | ICD-10-CM | POA: Diagnosis not present

## 2016-11-28 DIAGNOSIS — M6281 Muscle weakness (generalized): Secondary | ICD-10-CM | POA: Diagnosis not present

## 2016-12-02 DIAGNOSIS — M6281 Muscle weakness (generalized): Secondary | ICD-10-CM | POA: Diagnosis not present

## 2016-12-02 DIAGNOSIS — R2681 Unsteadiness on feet: Secondary | ICD-10-CM | POA: Diagnosis not present

## 2016-12-03 DIAGNOSIS — M6281 Muscle weakness (generalized): Secondary | ICD-10-CM | POA: Diagnosis not present

## 2016-12-03 DIAGNOSIS — H43813 Vitreous degeneration, bilateral: Secondary | ICD-10-CM | POA: Diagnosis not present

## 2016-12-03 DIAGNOSIS — H353132 Nonexudative age-related macular degeneration, bilateral, intermediate dry stage: Secondary | ICD-10-CM | POA: Diagnosis not present

## 2016-12-03 DIAGNOSIS — H35372 Puckering of macula, left eye: Secondary | ICD-10-CM | POA: Diagnosis not present

## 2016-12-03 DIAGNOSIS — R2681 Unsteadiness on feet: Secondary | ICD-10-CM | POA: Diagnosis not present

## 2016-12-03 DIAGNOSIS — D3132 Benign neoplasm of left choroid: Secondary | ICD-10-CM | POA: Diagnosis not present

## 2016-12-03 DIAGNOSIS — E113413 Type 2 diabetes mellitus with severe nonproliferative diabetic retinopathy with macular edema, bilateral: Secondary | ICD-10-CM | POA: Diagnosis not present

## 2016-12-04 DIAGNOSIS — R2681 Unsteadiness on feet: Secondary | ICD-10-CM | POA: Diagnosis not present

## 2016-12-04 DIAGNOSIS — M6281 Muscle weakness (generalized): Secondary | ICD-10-CM | POA: Diagnosis not present

## 2016-12-05 DIAGNOSIS — M6281 Muscle weakness (generalized): Secondary | ICD-10-CM | POA: Diagnosis not present

## 2016-12-05 DIAGNOSIS — R2681 Unsteadiness on feet: Secondary | ICD-10-CM | POA: Diagnosis not present

## 2016-12-09 DIAGNOSIS — R2681 Unsteadiness on feet: Secondary | ICD-10-CM | POA: Diagnosis not present

## 2016-12-09 DIAGNOSIS — M6281 Muscle weakness (generalized): Secondary | ICD-10-CM | POA: Diagnosis not present

## 2016-12-10 DIAGNOSIS — M6281 Muscle weakness (generalized): Secondary | ICD-10-CM | POA: Diagnosis not present

## 2016-12-10 DIAGNOSIS — R2681 Unsteadiness on feet: Secondary | ICD-10-CM | POA: Diagnosis not present

## 2016-12-11 DIAGNOSIS — M6281 Muscle weakness (generalized): Secondary | ICD-10-CM | POA: Diagnosis not present

## 2016-12-11 DIAGNOSIS — R2681 Unsteadiness on feet: Secondary | ICD-10-CM | POA: Diagnosis not present

## 2016-12-12 DIAGNOSIS — M6281 Muscle weakness (generalized): Secondary | ICD-10-CM | POA: Diagnosis not present

## 2016-12-12 DIAGNOSIS — R2681 Unsteadiness on feet: Secondary | ICD-10-CM | POA: Diagnosis not present

## 2016-12-16 DIAGNOSIS — M6281 Muscle weakness (generalized): Secondary | ICD-10-CM | POA: Diagnosis not present

## 2016-12-16 DIAGNOSIS — R2681 Unsteadiness on feet: Secondary | ICD-10-CM | POA: Diagnosis not present

## 2016-12-17 DIAGNOSIS — R2681 Unsteadiness on feet: Secondary | ICD-10-CM | POA: Diagnosis not present

## 2016-12-17 DIAGNOSIS — M6281 Muscle weakness (generalized): Secondary | ICD-10-CM | POA: Diagnosis not present

## 2016-12-18 DIAGNOSIS — M6281 Muscle weakness (generalized): Secondary | ICD-10-CM | POA: Diagnosis not present

## 2016-12-18 DIAGNOSIS — R2681 Unsteadiness on feet: Secondary | ICD-10-CM | POA: Diagnosis not present

## 2016-12-19 DIAGNOSIS — R2681 Unsteadiness on feet: Secondary | ICD-10-CM | POA: Diagnosis not present

## 2016-12-19 DIAGNOSIS — M6281 Muscle weakness (generalized): Secondary | ICD-10-CM | POA: Diagnosis not present

## 2016-12-22 DIAGNOSIS — M6281 Muscle weakness (generalized): Secondary | ICD-10-CM | POA: Diagnosis not present

## 2016-12-22 DIAGNOSIS — R2681 Unsteadiness on feet: Secondary | ICD-10-CM | POA: Diagnosis not present

## 2016-12-23 DIAGNOSIS — R2681 Unsteadiness on feet: Secondary | ICD-10-CM | POA: Diagnosis not present

## 2016-12-23 DIAGNOSIS — M6281 Muscle weakness (generalized): Secondary | ICD-10-CM | POA: Diagnosis not present

## 2016-12-25 DIAGNOSIS — R2681 Unsteadiness on feet: Secondary | ICD-10-CM | POA: Diagnosis not present

## 2016-12-25 DIAGNOSIS — M6281 Muscle weakness (generalized): Secondary | ICD-10-CM | POA: Diagnosis not present

## 2016-12-26 DIAGNOSIS — R2681 Unsteadiness on feet: Secondary | ICD-10-CM | POA: Diagnosis not present

## 2016-12-26 DIAGNOSIS — M6281 Muscle weakness (generalized): Secondary | ICD-10-CM | POA: Diagnosis not present

## 2016-12-29 DIAGNOSIS — R2681 Unsteadiness on feet: Secondary | ICD-10-CM | POA: Diagnosis not present

## 2016-12-29 DIAGNOSIS — M6281 Muscle weakness (generalized): Secondary | ICD-10-CM | POA: Diagnosis not present

## 2016-12-30 ENCOUNTER — Encounter: Payer: Self-pay | Admitting: Pulmonary Disease

## 2016-12-30 ENCOUNTER — Ambulatory Visit (INDEPENDENT_AMBULATORY_CARE_PROVIDER_SITE_OTHER): Payer: Medicare Other | Admitting: Pulmonary Disease

## 2016-12-30 VITALS — BP 134/72 | HR 49 | Ht 62.0 in | Wt 204.6 lb

## 2016-12-30 DIAGNOSIS — G4730 Sleep apnea, unspecified: Secondary | ICD-10-CM

## 2016-12-30 NOTE — Progress Notes (Signed)
Kathryn Peck    [id number]    01-24-1928  Primary Care Physician:Victoria R Rankins, MD  Referring Physician: Aretta Nip, MD [place], [place] [address]  Chief complaint:   Follow up for COPD OSA  HPI: Kathryn Peck is a 83 year old with history of hypertension, hyperlipidemia, chronic kidney disease, COPD, sleep apnea. She recently moved here last year from [place] into [place]. She has a diagnosis of COPD but is a never smoker. She is on Spiriva. She has some dyspnea on exertion but it does not appear to bother her much. She denies any cough, sputum production, wheezing, hemoptysis.   She also has a diagnosis of OSA and is on CPAP for the past 4-5 years. She does not know what pressure settings she is on. She is on nocturnal oxygen via CPAP at 2 LPM.  Interim History: She continues to do well on her CPAP machine. Still having issues trying to get her new oxygen ordered. She continues to use her old concentrator. She has some cough with yellow mucus production for the past 1 week. She denies any increased dyspnea, wheezing, fevers, chills.  Outpatient Encounter Prescriptions as of 12/30/2016  Medication Sig  . acetaminophen (TYLENOL) 325 MG tablet Take 650 mg by mouth every 6 (six) hours as needed.  Marland Kitchen amLODipine (NORVASC) 5 MG tablet Take 5 mg by mouth daily.  Marland Kitchen aspirin 81 MG tablet Take 81 mg by mouth daily.  . bumetanide (BUMEX) 1 MG tablet Take 1 mg by mouth daily.   . isosorbide mononitrate (IMDUR) 30 MG 24 hr tablet Take 30 mg by mouth daily.  Marland Kitchen latanoprost (XALATAN) 0.005 % ophthalmic solution 1 drop at bedtime.  . metoprolol succinate (TOPROL-XL) 50 MG 24 hr tablet Take 50 mg by mouth daily. Take with or immediately following a meal.  . Misc Natural Products (OSTEO BI-FLEX JOINT SHIELD PO) Take 1 tablet by mouth 2 (two) times daily.  . Multiple Vitamins-Minerals (CENTRUM SILVER ULTRA WOMENS PO) Take 1 tablet by mouth daily.  .  potassium chloride SA (K-DUR,KLOR-CON) 20 MEQ tablet Take 20 mEq by mouth daily.   . Probiotic Product (PROBIOTIC COLON SUPPORT PO) Take by mouth.  . simvastatin (ZOCOR) 10 MG tablet Take 10 mg by mouth daily.  Marland Kitchen tiotropium (SPIRIVA) 18 MCG inhalation capsule Place 18 mcg into inhaler and inhale daily.   No facility-administered encounter medications on file as of 12/30/2016.     Allergies as of 12/30/2016 - Review Complete 12/30/2016  Allergen Reaction Noted  . Latex Itching 10/18/2015  . Zetia [ezetimibe] Other (See Comments) 10/18/2015  . Adhesive [tape] Rash 10/18/2015    No past medical history on file.  No past surgical history on file.  No family history on file.  Social History   Social History  . Marital status: Unknown    Spouse name: N/A  . Number of children: N/A  . Years of education: N/A   Occupational History  . Not on file.   Social History Main Topics  . Smoking status: Never Smoker  . Smokeless tobacco: Never Used  . Alcohol use Not on file  . Drug use: Unknown  . Sexual activity: Not on file   Other Topics Concern  . Not on file   Social History Narrative  . No narrative on file   Review of systems: Review of Systems  Constitutional: Negative for fever and chills.  HENT: Negative.   Eyes: Negative  for blurred vision.  Respiratory: as per HPI  Cardiovascular: Negative for chest pain and palpitations.  Gastrointestinal: Negative for vomiting, diarrhea, blood per rectum. Genitourinary: Negative for dysuria, urgency, frequency and hematuria.  Musculoskeletal: Negative for myalgias, back pain and joint pain.  Skin: Negative for itching and rash.  Neurological: Negative for dizziness, tremors, focal weakness, seizures and loss of consciousness.  Endo/Heme/Allergies: Negative for environmental allergies.  Psychiatric/Behavioral: Negative for depression, suicidal ideas and hallucinations.  All other systems reviewed and are negative.  Physical  Exam: Blood pressure 134/72, pulse (!) 49, height 5\' 2"  (1.575 m), weight 204 lb 9.6 oz (92.8 kg), SpO2 98 %. Gen:      No acute distress HEENT:  EOMI, sclera anicteric Neck:     No masses; no thyromegaly Lungs:    Clear to auscultation bilaterally; normal respiratory effort CV:         Regular rate and rhythm; no murmurs Abd:      + bowel sounds; soft, non-tender; no palpable masses, no distension Ext:    No edema; adequate peripheral perfusion Skin:      Warm and dry; no rash Neuro: alert and oriented x 3 Psych: normal mood and affect  Data Reviewed: PFTs 01/30/16 FVC 1.86 [87%) FEV1 1.33 [85%) F/F 71 TLC 90% DLCO 59% Minimal obstructive disease, moderate reduction in DLCO which however corrects for alveolar volume.  Sleep study 01/16/16 Study did not show significant sleep-disordered breathing however the sleep time was pretty low. Study was performed on 2 L O2 and did not show any hypoxia.  Home sleep study 09/04/16 Severe OSA with desaturations to 81%. AHI 31.3,   BiPAP download December 2017-January 2018 Days greater than 4 hours-100% AHI 0.6  Assessment:  #1 COPD. PFTs show very minimal obstruction. I'm not entirely sure she has emphysema as she is a lifelong nonsmoker with no known exposures. She wishes to continue on the Spiriva as she feels it helps with her symptoms.  She has some cough with chest congestion however I don't believe she requires antibiotics or steroids. I have asked her to take Mucinex DM over-the-counter  #2 OSA She is currently on AutoSet CPAP 5-15 after a home study confirmed severe sleep apnea. Home sleep study confirms sleep apnea and she was started on CPAP with good response. However she was not approved for supplemental O2 that she was on in [place] before relocating to [place]. She will need documentation of hypoxia to be covered by insurance. I will see if we can arrange a overnight oximetry and if this would be adequate cover her oxygen  prescription  Plan/Recommendations: - Continue current CPAP - Schedule overnight oximetry  Marshell Garfinkel MD [place] [facility] Pager [phone] 12/30/2016, 3:08 PM  CC: Rankins, Bill Salinas, MD

## 2016-12-30 NOTE — Patient Instructions (Signed)
Continue using your CPAP as ordered. It seems to be working very well for treatment of sleep apnea Please use Mucinex DM over-the-counter for cough.  Return to clinic in 6 months

## 2017-01-01 DIAGNOSIS — R2681 Unsteadiness on feet: Secondary | ICD-10-CM | POA: Diagnosis not present

## 2017-01-01 DIAGNOSIS — M6281 Muscle weakness (generalized): Secondary | ICD-10-CM | POA: Diagnosis not present

## 2017-01-02 DIAGNOSIS — M6281 Muscle weakness (generalized): Secondary | ICD-10-CM | POA: Diagnosis not present

## 2017-01-02 DIAGNOSIS — R2681 Unsteadiness on feet: Secondary | ICD-10-CM | POA: Diagnosis not present

## 2017-01-05 DIAGNOSIS — R2681 Unsteadiness on feet: Secondary | ICD-10-CM | POA: Diagnosis not present

## 2017-01-05 DIAGNOSIS — M6281 Muscle weakness (generalized): Secondary | ICD-10-CM | POA: Diagnosis not present

## 2017-01-08 DIAGNOSIS — R2681 Unsteadiness on feet: Secondary | ICD-10-CM | POA: Diagnosis not present

## 2017-01-08 DIAGNOSIS — M6281 Muscle weakness (generalized): Secondary | ICD-10-CM | POA: Diagnosis not present

## 2017-01-09 DIAGNOSIS — M6281 Muscle weakness (generalized): Secondary | ICD-10-CM | POA: Diagnosis not present

## 2017-01-09 DIAGNOSIS — R2681 Unsteadiness on feet: Secondary | ICD-10-CM | POA: Diagnosis not present

## 2017-01-12 DIAGNOSIS — R2681 Unsteadiness on feet: Secondary | ICD-10-CM | POA: Diagnosis not present

## 2017-01-12 DIAGNOSIS — M6281 Muscle weakness (generalized): Secondary | ICD-10-CM | POA: Diagnosis not present

## 2017-01-14 DIAGNOSIS — M6281 Muscle weakness (generalized): Secondary | ICD-10-CM | POA: Diagnosis not present

## 2017-01-14 DIAGNOSIS — R2681 Unsteadiness on feet: Secondary | ICD-10-CM | POA: Diagnosis not present

## 2017-01-16 DIAGNOSIS — R2681 Unsteadiness on feet: Secondary | ICD-10-CM | POA: Diagnosis not present

## 2017-01-19 DIAGNOSIS — R2681 Unsteadiness on feet: Secondary | ICD-10-CM | POA: Diagnosis not present

## 2017-01-21 DIAGNOSIS — R2681 Unsteadiness on feet: Secondary | ICD-10-CM | POA: Diagnosis not present

## 2017-01-23 DIAGNOSIS — R2681 Unsteadiness on feet: Secondary | ICD-10-CM | POA: Diagnosis not present

## 2017-01-26 DIAGNOSIS — R2681 Unsteadiness on feet: Secondary | ICD-10-CM | POA: Diagnosis not present

## 2017-01-27 DIAGNOSIS — R2681 Unsteadiness on feet: Secondary | ICD-10-CM | POA: Diagnosis not present

## 2017-01-29 DIAGNOSIS — R2681 Unsteadiness on feet: Secondary | ICD-10-CM | POA: Diagnosis not present

## 2017-02-03 DIAGNOSIS — D631 Anemia in chronic kidney disease: Secondary | ICD-10-CM | POA: Diagnosis not present

## 2017-02-03 DIAGNOSIS — N184 Chronic kidney disease, stage 4 (severe): Secondary | ICD-10-CM | POA: Diagnosis not present

## 2017-02-03 DIAGNOSIS — N2581 Secondary hyperparathyroidism of renal origin: Secondary | ICD-10-CM | POA: Diagnosis not present

## 2017-02-04 DIAGNOSIS — R2681 Unsteadiness on feet: Secondary | ICD-10-CM | POA: Diagnosis not present

## 2017-02-13 ENCOUNTER — Inpatient Hospital Stay (HOSPITAL_COMMUNITY): Admission: RE | Admit: 2017-02-13 | Payer: Medicare Other | Source: Ambulatory Visit

## 2017-02-17 DIAGNOSIS — N184 Chronic kidney disease, stage 4 (severe): Secondary | ICD-10-CM | POA: Diagnosis not present

## 2017-02-17 DIAGNOSIS — N2581 Secondary hyperparathyroidism of renal origin: Secondary | ICD-10-CM | POA: Diagnosis not present

## 2017-02-17 DIAGNOSIS — I129 Hypertensive chronic kidney disease with stage 1 through stage 4 chronic kidney disease, or unspecified chronic kidney disease: Secondary | ICD-10-CM | POA: Diagnosis not present

## 2017-02-17 DIAGNOSIS — D631 Anemia in chronic kidney disease: Secondary | ICD-10-CM | POA: Diagnosis not present

## 2017-03-31 DIAGNOSIS — N2581 Secondary hyperparathyroidism of renal origin: Secondary | ICD-10-CM | POA: Diagnosis not present

## 2017-03-31 DIAGNOSIS — D631 Anemia in chronic kidney disease: Secondary | ICD-10-CM | POA: Diagnosis not present

## 2017-03-31 DIAGNOSIS — N184 Chronic kidney disease, stage 4 (severe): Secondary | ICD-10-CM | POA: Diagnosis not present

## 2017-04-28 ENCOUNTER — Encounter: Payer: Self-pay | Admitting: Pulmonary Disease

## 2017-04-28 NOTE — Telephone Encounter (Signed)
Kathryn Peck, Kathryn  Peck, Kathryn Peck, [place]        IN order for [facility] to provide the pt with o2, we will need a new cpap titration study that shows the pt has been placed on optimal pressure and still has sats of 88 or below for 5 minutes or longer. This has to be done as an in lab titration study. They will need to have the titration study, ov notes, order and delivery within 30 days of each.   Medicare will not accept an ono on cpap to qualify the pt for o2. Thank you!    PM, Please advise. Thanks.

## 2017-04-29 DIAGNOSIS — E669 Obesity, unspecified: Secondary | ICD-10-CM | POA: Diagnosis not present

## 2017-04-29 DIAGNOSIS — N2581 Secondary hyperparathyroidism of renal origin: Secondary | ICD-10-CM | POA: Diagnosis not present

## 2017-04-29 DIAGNOSIS — I129 Hypertensive chronic kidney disease with stage 1 through stage 4 chronic kidney disease, or unspecified chronic kidney disease: Secondary | ICD-10-CM | POA: Diagnosis not present

## 2017-04-29 DIAGNOSIS — N184 Chronic kidney disease, stage 4 (severe): Secondary | ICD-10-CM | POA: Diagnosis not present

## 2017-04-29 DIAGNOSIS — D631 Anemia in chronic kidney disease: Secondary | ICD-10-CM | POA: Diagnosis not present

## 2017-05-07 DIAGNOSIS — H43813 Vitreous degeneration, bilateral: Secondary | ICD-10-CM | POA: Diagnosis not present

## 2017-05-07 DIAGNOSIS — H353132 Nonexudative age-related macular degeneration, bilateral, intermediate dry stage: Secondary | ICD-10-CM | POA: Diagnosis not present

## 2017-05-07 DIAGNOSIS — H35372 Puckering of macula, left eye: Secondary | ICD-10-CM | POA: Diagnosis not present

## 2017-05-07 DIAGNOSIS — D3132 Benign neoplasm of left choroid: Secondary | ICD-10-CM | POA: Diagnosis not present

## 2017-05-20 ENCOUNTER — Encounter: Payer: Self-pay | Admitting: Pulmonary Disease

## 2017-05-20 NOTE — Telephone Encounter (Signed)
PM please see email from 04/28/17 and advise to below message. Thanks.

## 2017-05-21 ENCOUNTER — Telehealth: Payer: Self-pay

## 2017-05-21 NOTE — Telephone Encounter (Signed)
Spoke with pt daughter in regards to pt needing a cpap titration study to qualify for night time O2. Marliss Coots is aware that per PM pt can do ONO to see if she desats, but a sleep study would still be needed to quality pt.  Marliss Coots states she will discuss this will pt and will call us back.

## 2017-06-08 DIAGNOSIS — E785 Hyperlipidemia, unspecified: Secondary | ICD-10-CM | POA: Diagnosis not present

## 2017-06-08 DIAGNOSIS — N184 Chronic kidney disease, stage 4 (severe): Secondary | ICD-10-CM | POA: Diagnosis not present

## 2017-06-08 DIAGNOSIS — E119 Type 2 diabetes mellitus without complications: Secondary | ICD-10-CM | POA: Diagnosis not present

## 2017-06-08 DIAGNOSIS — J449 Chronic obstructive pulmonary disease, unspecified: Secondary | ICD-10-CM | POA: Diagnosis not present

## 2017-06-08 DIAGNOSIS — I350 Nonrheumatic aortic (valve) stenosis: Secondary | ICD-10-CM | POA: Diagnosis not present

## 2017-06-08 DIAGNOSIS — E668 Other obesity: Secondary | ICD-10-CM | POA: Diagnosis not present

## 2017-06-08 DIAGNOSIS — R0602 Shortness of breath: Secondary | ICD-10-CM | POA: Diagnosis not present

## 2017-06-08 DIAGNOSIS — I119 Hypertensive heart disease without heart failure: Secondary | ICD-10-CM | POA: Diagnosis not present

## 2017-06-11 NOTE — Telephone Encounter (Signed)
Called and spoke with pts daughter and she stated that she has still not been able to talk the pt into having the in lab sleep study done.  Will forward to PM to make him aware.

## 2017-06-29 ENCOUNTER — Encounter: Payer: Self-pay | Admitting: Pulmonary Disease

## 2017-06-29 ENCOUNTER — Ambulatory Visit (INDEPENDENT_AMBULATORY_CARE_PROVIDER_SITE_OTHER): Payer: Medicare Other | Admitting: Pulmonary Disease

## 2017-06-29 VITALS — BP 130/64 | HR 69 | Ht 62.0 in | Wt 199.8 lb

## 2017-06-29 DIAGNOSIS — G4734 Idiopathic sleep related nonobstructive alveolar hypoventilation: Secondary | ICD-10-CM

## 2017-06-29 DIAGNOSIS — G4730 Sleep apnea, unspecified: Secondary | ICD-10-CM | POA: Diagnosis not present

## 2017-06-29 NOTE — Progress Notes (Signed)
[name]    [id number]    June 11, 1928  Primary Care Physician:Rankins, Bill Salinas, MD  Referring Physician: Aretta Nip, [place], [place] [address]  Chief complaint:   Follow up for COPD OSA  HPI: [name] is a 83 year old with history of hypertension, hyperlipidemia, chronic kidney disease, COPD, sleep apnea. She recently moved here last year from [place] into [place]. She has a diagnosis of COPD but is a never smoker. She is on Spiriva. She has some dyspnea on exertion but it does not appear to bother her much. She denies any cough, sputum production, wheezing, hemoptysis.   She also has a diagnosis of OSA and is on CPAP for the past 4-5 years. She does not know what pressure settings she is on. She is on nocturnal oxygen via CPAP at 2 LPM.  Interim History: She continues to do well on her CPAP machine. Still having issues trying to get her new oxygen ordered. Her old concentrator broke down and she is not getting any supplemental O2. She saw an opthalmologist who noticed mild retinal swelling and questioned if this is due to hypoxia.   Outpatient Encounter Prescriptions as of 06/29/2017  Medication Sig  . acetaminophen (TYLENOL) 325 MG tablet Take 650 mg by mouth every 6 (six) hours as needed.  Marland Kitchen amLODipine (NORVASC) 5 MG tablet Take 5 mg by mouth daily.  Marland Kitchen aspirin 81 MG tablet Take 81 mg by mouth daily.  . bumetanide (BUMEX) 1 MG tablet Take 1 mg by mouth daily.   . isosorbide mononitrate (IMDUR) 30 MG 24 hr tablet Take 30 mg by mouth daily.  Marland Kitchen latanoprost (XALATAN) 0.005 % ophthalmic solution 1 drop at bedtime.  . metoprolol succinate (TOPROL-XL) 50 MG 24 hr tablet Take 50 mg by mouth daily. Take with or immediately following a meal.  . Misc Natural Products (OSTEO BI-FLEX JOINT SHIELD PO) Take 1 tablet by mouth 2 (two) times daily.  . Multiple Vitamins-Minerals (CENTRUM SILVER ULTRA WOMENS PO) Take 1 tablet by mouth  daily.  . potassium chloride SA (K-DUR,KLOR-CON) 20 MEQ tablet Take 20 mEq by mouth daily.   . Probiotic Product (PROBIOTIC COLON SUPPORT PO) Take by mouth.  . simvastatin (ZOCOR) 10 MG tablet Take 10 mg by mouth daily.  Marland Kitchen tiotropium (SPIRIVA) 18 MCG inhalation capsule Place 18 mcg into inhaler and inhale daily.  . Vitamin D, Ergocalciferol, (DRISDOL) 50000 units CAPS capsule Take 50,000 Units by mouth every 7 (seven) days.   No facility-administered encounter medications on file as of 06/29/2017.     Allergies as of 06/29/2017 - Review Complete 06/29/2017  Allergen Reaction Noted  . Latex Itching 10/18/2015  . Zetia [ezetimibe] Other (See Comments) 10/18/2015  . Adhesive [tape] Rash 10/18/2015    No past medical history on file.  No past surgical history on file.  No family history on file.  Social History   Social History  . Marital status: Unknown    Spouse name: N/A  . Number of children: N/A  . Years of education: N/A   Occupational History  . Not on file.   Social History Main Topics  . Smoking status: Never Smoker  . Smokeless tobacco: Never Used  . Alcohol use Not on file  . Drug use: Unknown  . Sexual activity: Not on file   Other Topics Concern  . Not on file   Social History Narrative  . No narrative on file  Review of systems: Review of Systems  Constitutional: Negative for fever and chills.  HENT: Negative.   Eyes: Negative for blurred vision.  Respiratory: as per HPI  Cardiovascular: Negative for chest pain and palpitations.  Gastrointestinal: Negative for vomiting, diarrhea, blood per rectum. Genitourinary: Negative for dysuria, urgency, frequency and hematuria.  Musculoskeletal: Negative for myalgias, back pain and joint pain.  Skin: Negative for itching and rash.  Neurological: Negative for dizziness, tremors, focal weakness, seizures and loss of consciousness.  Endo/Heme/Allergies: Negative for environmental allergies.    Psychiatric/Behavioral: Negative for depression, suicidal ideas and hallucinations.  All other systems reviewed and are negative.  Physical Exam: Blood pressure 130/64, pulse 69, height 5\' 2"  (1.575 m), weight 199 lb 12.8 oz (90.6 kg), SpO2 95 %. Gen:      No acute distress HEENT:  EOMI, sclera anicteric Neck:     No masses; no thyromegaly Lungs:    Clear to auscultation bilaterally; normal respiratory effort CV:         Regular rate and rhythm; no murmurs Abd:      + bowel sounds; soft, non-tender; no palpable masses, no distension Ext:    No edema; adequate peripheral perfusion Skin:      Warm and dry; no rash Neuro: alert and oriented x 3 Psych: normal mood and affect  Data Reviewed: PFTs 01/30/16 FVC 1.86 [87%) FEV1 1.33 [85%) F/F 71 TLC 90% DLCO 59% Minimal obstructive disease, moderate reduction in DLCO which however corrects for alveolar volume.  Sleep study 01/16/16 Study did not show significant sleep-disordered breathing however the sleep time was pretty low. Study was performed on 2 L O2 and did not show any hypoxia.  Home sleep study 09/04/16 Severe OSA with desaturations to 81%. AHI 31.3,   BiPAP download June-July 2018 Days greater than 4 hours-100% AHI 0.5  Assessment:  #1 COPD. PFTs show very minimal obstruction. I'm not entirely sure she has emphysema as she is a lifelong nonsmoker with no known exposures. She wishes to continue on the Spiriva as she feels it helps with her symptoms and continue on mucinex.DM.    #2 OSA She is currently on AutoSet CPAP 5-15 after a home study confirmed severe sleep apnea. Home sleep study confirms sleep apnea and she was started on CPAP with good response. However she was not approved for supplemental O2 that she was on in [place] before relocating to [place]. She will need documentation of hypoxia to be covered by insurance. Insurance will not cover unless she get a in house sleep study.   I willI see if we can arrange a  overnight oximetry ON CPAP. If normal then she will not need home O2. If abnormal and she still will not do repeat sleep study then we wil discuss if she wants to pay out of pocket for the O2.   Plan/Recommendations: - Continue current CPAP - Schedule overnight oximetry on CPAP  Marshell Garfinkel MD  Pulmonary and Critical Care Pager [phone] 06/29/2017, 1:43 PM  CC: Rankins, Bill Salinas, MD

## 2017-06-29 NOTE — Patient Instructions (Signed)
We will schedule you for overnight oximetry on CPAP Based on what it shows we will discuss if willing to undergo a sleep study  Return in 6 months

## 2017-07-01 DIAGNOSIS — R531 Weakness: Secondary | ICD-10-CM | POA: Diagnosis not present

## 2017-07-01 DIAGNOSIS — R8290 Unspecified abnormal findings in urine: Secondary | ICD-10-CM | POA: Diagnosis not present

## 2017-07-01 DIAGNOSIS — R739 Hyperglycemia, unspecified: Secondary | ICD-10-CM | POA: Diagnosis not present

## 2017-07-01 DIAGNOSIS — R399 Unspecified symptoms and signs involving the genitourinary system: Secondary | ICD-10-CM | POA: Diagnosis not present

## 2017-07-08 DIAGNOSIS — J449 Chronic obstructive pulmonary disease, unspecified: Secondary | ICD-10-CM | POA: Diagnosis not present

## 2017-07-08 DIAGNOSIS — R0902 Hypoxemia: Secondary | ICD-10-CM | POA: Diagnosis not present

## 2017-07-21 DIAGNOSIS — N184 Chronic kidney disease, stage 4 (severe): Secondary | ICD-10-CM | POA: Diagnosis not present

## 2017-07-21 DIAGNOSIS — N2581 Secondary hyperparathyroidism of renal origin: Secondary | ICD-10-CM | POA: Diagnosis not present

## 2017-07-21 DIAGNOSIS — D631 Anemia in chronic kidney disease: Secondary | ICD-10-CM | POA: Diagnosis not present

## 2017-07-29 DIAGNOSIS — H353132 Nonexudative age-related macular degeneration, bilateral, intermediate dry stage: Secondary | ICD-10-CM | POA: Diagnosis not present

## 2017-07-29 DIAGNOSIS — E113413 Type 2 diabetes mellitus with severe nonproliferative diabetic retinopathy with macular edema, bilateral: Secondary | ICD-10-CM | POA: Diagnosis not present

## 2017-07-29 DIAGNOSIS — H43813 Vitreous degeneration, bilateral: Secondary | ICD-10-CM | POA: Diagnosis not present

## 2017-07-29 DIAGNOSIS — H35372 Puckering of macula, left eye: Secondary | ICD-10-CM | POA: Diagnosis not present

## 2017-07-29 DIAGNOSIS — D3132 Benign neoplasm of left choroid: Secondary | ICD-10-CM | POA: Diagnosis not present

## 2017-08-04 DIAGNOSIS — I120 Hypertensive chronic kidney disease with stage 5 chronic kidney disease or end stage renal disease: Secondary | ICD-10-CM | POA: Diagnosis not present

## 2017-08-04 DIAGNOSIS — E669 Obesity, unspecified: Secondary | ICD-10-CM | POA: Diagnosis not present

## 2017-08-04 DIAGNOSIS — N185 Chronic kidney disease, stage 5: Secondary | ICD-10-CM | POA: Diagnosis not present

## 2017-08-04 DIAGNOSIS — D631 Anemia in chronic kidney disease: Secondary | ICD-10-CM | POA: Diagnosis not present

## 2017-08-04 DIAGNOSIS — N2581 Secondary hyperparathyroidism of renal origin: Secondary | ICD-10-CM | POA: Diagnosis not present

## 2017-09-04 ENCOUNTER — Telehealth: Payer: Self-pay

## 2017-09-04 NOTE — Telephone Encounter (Signed)
Received order from [facility] to d/c O2.  I have faxed back PM response, as he did not wish to d/c nighttime O2 at this time. Nothing further needed.

## 2017-10-28 DIAGNOSIS — N2581 Secondary hyperparathyroidism of renal origin: Secondary | ICD-10-CM | POA: Diagnosis not present

## 2017-10-28 DIAGNOSIS — I120 Hypertensive chronic kidney disease with stage 5 chronic kidney disease or end stage renal disease: Secondary | ICD-10-CM | POA: Diagnosis not present

## 2017-10-28 DIAGNOSIS — N185 Chronic kidney disease, stage 5: Secondary | ICD-10-CM | POA: Diagnosis not present

## 2017-10-28 DIAGNOSIS — D631 Anemia in chronic kidney disease: Secondary | ICD-10-CM | POA: Diagnosis not present

## 2017-11-16 DIAGNOSIS — H353132 Nonexudative age-related macular degeneration, bilateral, intermediate dry stage: Secondary | ICD-10-CM | POA: Diagnosis not present

## 2017-11-16 DIAGNOSIS — H35372 Puckering of macula, left eye: Secondary | ICD-10-CM | POA: Diagnosis not present

## 2017-11-16 DIAGNOSIS — E113413 Type 2 diabetes mellitus with severe nonproliferative diabetic retinopathy with macular edema, bilateral: Secondary | ICD-10-CM | POA: Diagnosis not present

## 2017-11-16 DIAGNOSIS — D3132 Benign neoplasm of left choroid: Secondary | ICD-10-CM | POA: Diagnosis not present

## 2017-11-16 DIAGNOSIS — H43813 Vitreous degeneration, bilateral: Secondary | ICD-10-CM | POA: Diagnosis not present

## 2017-12-14 DIAGNOSIS — M6281 Muscle weakness (generalized): Secondary | ICD-10-CM | POA: Diagnosis not present

## 2017-12-14 DIAGNOSIS — M1710 Unilateral primary osteoarthritis, unspecified knee: Secondary | ICD-10-CM | POA: Diagnosis not present

## 2017-12-14 DIAGNOSIS — R2681 Unsteadiness on feet: Secondary | ICD-10-CM | POA: Diagnosis not present

## 2017-12-16 DIAGNOSIS — R2681 Unsteadiness on feet: Secondary | ICD-10-CM | POA: Diagnosis not present

## 2017-12-16 DIAGNOSIS — M1710 Unilateral primary osteoarthritis, unspecified knee: Secondary | ICD-10-CM | POA: Diagnosis not present

## 2017-12-16 DIAGNOSIS — M6281 Muscle weakness (generalized): Secondary | ICD-10-CM | POA: Diagnosis not present

## 2017-12-17 DIAGNOSIS — R2681 Unsteadiness on feet: Secondary | ICD-10-CM | POA: Diagnosis not present

## 2017-12-17 DIAGNOSIS — M6281 Muscle weakness (generalized): Secondary | ICD-10-CM | POA: Diagnosis not present

## 2017-12-17 DIAGNOSIS — M1710 Unilateral primary osteoarthritis, unspecified knee: Secondary | ICD-10-CM | POA: Diagnosis not present

## 2017-12-18 DIAGNOSIS — R2681 Unsteadiness on feet: Secondary | ICD-10-CM | POA: Diagnosis not present

## 2017-12-18 DIAGNOSIS — M6281 Muscle weakness (generalized): Secondary | ICD-10-CM | POA: Diagnosis not present

## 2017-12-18 DIAGNOSIS — M1710 Unilateral primary osteoarthritis, unspecified knee: Secondary | ICD-10-CM | POA: Diagnosis not present

## 2017-12-21 DIAGNOSIS — M1710 Unilateral primary osteoarthritis, unspecified knee: Secondary | ICD-10-CM | POA: Diagnosis not present

## 2017-12-21 DIAGNOSIS — M6281 Muscle weakness (generalized): Secondary | ICD-10-CM | POA: Diagnosis not present

## 2017-12-21 DIAGNOSIS — R2681 Unsteadiness on feet: Secondary | ICD-10-CM | POA: Diagnosis not present

## 2017-12-22 DIAGNOSIS — M1710 Unilateral primary osteoarthritis, unspecified knee: Secondary | ICD-10-CM | POA: Diagnosis not present

## 2017-12-22 DIAGNOSIS — R2681 Unsteadiness on feet: Secondary | ICD-10-CM | POA: Diagnosis not present

## 2017-12-22 DIAGNOSIS — M6281 Muscle weakness (generalized): Secondary | ICD-10-CM | POA: Diagnosis not present

## 2017-12-23 DIAGNOSIS — M6281 Muscle weakness (generalized): Secondary | ICD-10-CM | POA: Diagnosis not present

## 2017-12-23 DIAGNOSIS — R2681 Unsteadiness on feet: Secondary | ICD-10-CM | POA: Diagnosis not present

## 2017-12-23 DIAGNOSIS — M1710 Unilateral primary osteoarthritis, unspecified knee: Secondary | ICD-10-CM | POA: Diagnosis not present

## 2017-12-24 DIAGNOSIS — M6281 Muscle weakness (generalized): Secondary | ICD-10-CM | POA: Diagnosis not present

## 2017-12-24 DIAGNOSIS — R2681 Unsteadiness on feet: Secondary | ICD-10-CM | POA: Diagnosis not present

## 2017-12-24 DIAGNOSIS — M1710 Unilateral primary osteoarthritis, unspecified knee: Secondary | ICD-10-CM | POA: Diagnosis not present

## 2017-12-25 DIAGNOSIS — R2681 Unsteadiness on feet: Secondary | ICD-10-CM | POA: Diagnosis not present

## 2017-12-25 DIAGNOSIS — M6281 Muscle weakness (generalized): Secondary | ICD-10-CM | POA: Diagnosis not present

## 2017-12-25 DIAGNOSIS — M1710 Unilateral primary osteoarthritis, unspecified knee: Secondary | ICD-10-CM | POA: Diagnosis not present

## 2017-12-28 DIAGNOSIS — M1710 Unilateral primary osteoarthritis, unspecified knee: Secondary | ICD-10-CM | POA: Diagnosis not present

## 2017-12-28 DIAGNOSIS — M6281 Muscle weakness (generalized): Secondary | ICD-10-CM | POA: Diagnosis not present

## 2017-12-28 DIAGNOSIS — R2681 Unsteadiness on feet: Secondary | ICD-10-CM | POA: Diagnosis not present

## 2017-12-29 ENCOUNTER — Ambulatory Visit (INDEPENDENT_AMBULATORY_CARE_PROVIDER_SITE_OTHER): Payer: Medicare Other | Admitting: Pulmonary Disease

## 2017-12-29 ENCOUNTER — Encounter: Payer: Self-pay | Admitting: Pulmonary Disease

## 2017-12-29 VITALS — BP 132/78 | HR 76 | Ht 63.0 in | Wt 197.0 lb

## 2017-12-29 DIAGNOSIS — G4733 Obstructive sleep apnea (adult) (pediatric): Secondary | ICD-10-CM | POA: Diagnosis not present

## 2017-12-29 NOTE — Patient Instructions (Signed)
I am glad that you are doing well with his CPAP and Spiriva Continue the same.  Follow-up in 1 year.

## 2017-12-29 NOTE — Progress Notes (Signed)
[name]    [id number]    03/22/1928  Primary Care Physician:Rankins, Bill Salinas, MD  Referring Physician: Aretta Nip, [place], [place] [address]  Chief complaint:   Follow up for COPD OSA  HPI: Kathryn Peck is a 84 year old with history of hypertension, hyperlipidemia, chronic kidney disease, COPD, sleep apnea. She recently moved here last year from [place] into [place]. She has a diagnosis of COPD but is a never smoker. She is on Spiriva. She has some dyspnea on exertion but it does not appear to bother her much. She denies any cough, sputum production, wheezing, hemoptysis.   She also has a diagnosis of OSA and is on CPAP for the past 4-5 years. She does not know what pressure settings she is on. She is on nocturnal oxygen via CPAP at 2 LPM.  Interim History: She continues to do well on her CPAP machine.  Overnight oximetry was ordered but could not be completed as the patient could not sleep.  She is now currently off supplemental oxygen at night and continues to do well.  Outpatient Encounter Medications as of 12/29/2017  Medication Sig  . acetaminophen (TYLENOL) 325 MG tablet Take 650 mg by mouth every 6 (six) hours as needed.  Marland Kitchen amLODipine (NORVASC) 5 MG tablet Take 5 mg by mouth daily.  Marland Kitchen aspirin 81 MG tablet Take 81 mg by mouth daily.  . bumetanide (BUMEX) 1 MG tablet Take 1 mg by mouth daily.   . isosorbide mononitrate (IMDUR) 30 MG 24 hr tablet Take 30 mg by mouth daily.  Marland Kitchen latanoprost (XALATAN) 0.005 % ophthalmic solution 1 drop at bedtime.  . metoprolol succinate (TOPROL-XL) 50 MG 24 hr tablet Take 50 mg by mouth daily. Take with or immediately following a meal.  . Misc Natural Products (OSTEO BI-FLEX JOINT SHIELD PO) Take 1 tablet by mouth 2 (two) times daily.  . Multiple Vitamins-Minerals (CENTRUM SILVER ULTRA WOMENS PO) Take 1 tablet by mouth daily.  . potassium chloride SA (K-DUR,KLOR-CON) 20 MEQ tablet Take  20 mEq by mouth daily.   . Probiotic Product (PROBIOTIC COLON SUPPORT PO) Take by mouth.  . simvastatin (ZOCOR) 10 MG tablet Take 10 mg by mouth daily.  Marland Kitchen tiotropium (SPIRIVA) 18 MCG inhalation capsule Place 18 mcg into inhaler and inhale daily.  . Vitamin D, Ergocalciferol, (DRISDOL) 50000 units CAPS capsule Take 50,000 Units by mouth every 7 (seven) days.   No facility-administered encounter medications on file as of 12/29/2017.     Allergies as of 12/29/2017 - Review Complete 12/29/2017  Allergen Reaction Noted  . Latex Itching 10/18/2015  . Zetia [ezetimibe] Other (See Comments) 10/18/2015  . Adhesive [tape] Rash 10/18/2015    No past medical history on file.  No past surgical history on file.  No family history on file.  Social History   Socioeconomic History  . Marital status: Unknown    Spouse name: Not on file  . Number of children: Not on file  . Years of education: Not on file  . Highest education level: Not on file  Social Needs  . Financial resource strain: Not on file  . Food insecurity - worry: Not on file  . Food insecurity - inability: Not on file  . Transportation needs - medical: Not on file  . Transportation needs - non-medical: Not on file  Occupational History  . Not on file  Tobacco Use  . Smoking status: Never  Smoker  . Smokeless tobacco: Never Used  Substance and Sexual Activity  . Alcohol use: Not on file  . Drug use: Not on file  . Sexual activity: Not on file  Other Topics Concern  . Not on file  Social History Narrative  . Not on file   Review of systems: Review of Systems  Constitutional: Negative for fever and chills.  HENT: Negative.   Eyes: Negative for blurred vision.  Respiratory: as per HPI  Cardiovascular: Negative for chest pain and palpitations.  Gastrointestinal: Negative for vomiting, diarrhea, blood per rectum. Genitourinary: Negative for dysuria, urgency, frequency and hematuria.  Musculoskeletal: Negative for  myalgias, back pain and joint pain.  Skin: Negative for itching and rash.  Neurological: Negative for dizziness, tremors, focal weakness, seizures and loss of consciousness.  Endo/Heme/Allergies: Negative for environmental allergies.  Psychiatric/Behavioral: Negative for depression, suicidal ideas and hallucinations.  All other systems reviewed and are negative.  Physical Exam: Blood pressure 132/78, pulse 76, height 5\' 3"  (1.6 m), weight 197 lb (89.4 kg), SpO2 97 %. Gen:      No acute distress HEENT:  EOMI, sclera anicteric Neck:     No masses; no thyromegaly Lungs:    Clear to auscultation bilaterally; normal respiratory effort CV:         Regular rate and rhythm; no murmurs Abd:      + bowel sounds; soft, non-tender; no palpable masses, no distension Ext:    No edema; adequate peripheral perfusion Skin:      Warm and dry; no rash Neuro: alert and oriented x 3 Psych: normal mood and affect  Data Reviewed: PFTs 01/30/16 FVC 1.86 [87%), FEV1 1.33 [85%), F/F 71, TLC 90%, DLCO 59% Minimal obstructive disease, moderate reduction in DLCO which however corrects for alveolar volume.  Sleep study 01/16/16 Study did not show significant sleep-disordered breathing however the sleep time was pretty low. Study was performed on 2 L O2 and did not show any hypoxia.  Home sleep study 09/04/16 Severe OSA with desaturations to 81%. AHI 31.3,   BiPAP download June-July 2018 Days greater than 4 hours-100% AHI 0.5  Assessment:  #1 COPD. PFTs show very minimal obstruction. I'm not entirely sure she has emphysema as she is a lifelong nonsmoker with no known exposures. She wishes to continue on the Spiriva as she feels it helps with her symptoms and continue on mucinex.DM.   #2 OSA She is currently on AutoSet CPAP 5-15 after a home study confirmed severe sleep apnea. Home sleep study confirms sleep apnea and she was started on CPAP with good response. However she was not approved for supplemental O2  that she was on in [place] before relocating to [place].  Overnight oximetry was nondiagnostic.  She is not on supplemental oxygen at night since insurance would not approve without documentation of desats.  As she is stable on the CPAP which is continue to monitor for now.   Plan/Recommendations: - Continue current CPAP  Marshell Garfinkel MD [facility] Pager [phone] 12/29/2017, 11:21 AM  CC: Rankins, Bill Salinas, MD

## 2017-12-30 DIAGNOSIS — M6281 Muscle weakness (generalized): Secondary | ICD-10-CM | POA: Diagnosis not present

## 2017-12-30 DIAGNOSIS — M1710 Unilateral primary osteoarthritis, unspecified knee: Secondary | ICD-10-CM | POA: Diagnosis not present

## 2017-12-30 DIAGNOSIS — R2681 Unsteadiness on feet: Secondary | ICD-10-CM | POA: Diagnosis not present

## 2017-12-31 DIAGNOSIS — M6281 Muscle weakness (generalized): Secondary | ICD-10-CM | POA: Diagnosis not present

## 2017-12-31 DIAGNOSIS — R2681 Unsteadiness on feet: Secondary | ICD-10-CM | POA: Diagnosis not present

## 2017-12-31 DIAGNOSIS — M1710 Unilateral primary osteoarthritis, unspecified knee: Secondary | ICD-10-CM | POA: Diagnosis not present

## 2018-01-01 DIAGNOSIS — M6281 Muscle weakness (generalized): Secondary | ICD-10-CM | POA: Diagnosis not present

## 2018-01-01 DIAGNOSIS — R2681 Unsteadiness on feet: Secondary | ICD-10-CM | POA: Diagnosis not present

## 2018-01-01 DIAGNOSIS — M1710 Unilateral primary osteoarthritis, unspecified knee: Secondary | ICD-10-CM | POA: Diagnosis not present

## 2018-01-04 DIAGNOSIS — M1710 Unilateral primary osteoarthritis, unspecified knee: Secondary | ICD-10-CM | POA: Diagnosis not present

## 2018-01-04 DIAGNOSIS — M6281 Muscle weakness (generalized): Secondary | ICD-10-CM | POA: Diagnosis not present

## 2018-01-04 DIAGNOSIS — R2681 Unsteadiness on feet: Secondary | ICD-10-CM | POA: Diagnosis not present

## 2018-01-05 DIAGNOSIS — M6281 Muscle weakness (generalized): Secondary | ICD-10-CM | POA: Diagnosis not present

## 2018-01-05 DIAGNOSIS — R2681 Unsteadiness on feet: Secondary | ICD-10-CM | POA: Diagnosis not present

## 2018-01-05 DIAGNOSIS — M1710 Unilateral primary osteoarthritis, unspecified knee: Secondary | ICD-10-CM | POA: Diagnosis not present

## 2018-01-07 DIAGNOSIS — M6281 Muscle weakness (generalized): Secondary | ICD-10-CM | POA: Diagnosis not present

## 2018-01-07 DIAGNOSIS — R2681 Unsteadiness on feet: Secondary | ICD-10-CM | POA: Diagnosis not present

## 2018-01-07 DIAGNOSIS — M1710 Unilateral primary osteoarthritis, unspecified knee: Secondary | ICD-10-CM | POA: Diagnosis not present

## 2018-01-08 DIAGNOSIS — R2681 Unsteadiness on feet: Secondary | ICD-10-CM | POA: Diagnosis not present

## 2018-01-08 DIAGNOSIS — M6281 Muscle weakness (generalized): Secondary | ICD-10-CM | POA: Diagnosis not present

## 2018-01-08 DIAGNOSIS — M1710 Unilateral primary osteoarthritis, unspecified knee: Secondary | ICD-10-CM | POA: Diagnosis not present

## 2018-01-11 DIAGNOSIS — R2681 Unsteadiness on feet: Secondary | ICD-10-CM | POA: Diagnosis not present

## 2018-01-11 DIAGNOSIS — M1710 Unilateral primary osteoarthritis, unspecified knee: Secondary | ICD-10-CM | POA: Diagnosis not present

## 2018-01-11 DIAGNOSIS — M6281 Muscle weakness (generalized): Secondary | ICD-10-CM | POA: Diagnosis not present

## 2018-01-13 DIAGNOSIS — R2681 Unsteadiness on feet: Secondary | ICD-10-CM | POA: Diagnosis not present

## 2018-01-13 DIAGNOSIS — M1710 Unilateral primary osteoarthritis, unspecified knee: Secondary | ICD-10-CM | POA: Diagnosis not present

## 2018-01-13 DIAGNOSIS — M6281 Muscle weakness (generalized): Secondary | ICD-10-CM | POA: Diagnosis not present

## 2018-01-14 DIAGNOSIS — M1710 Unilateral primary osteoarthritis, unspecified knee: Secondary | ICD-10-CM | POA: Diagnosis not present

## 2018-01-14 DIAGNOSIS — R2681 Unsteadiness on feet: Secondary | ICD-10-CM | POA: Diagnosis not present

## 2018-01-14 DIAGNOSIS — M6281 Muscle weakness (generalized): Secondary | ICD-10-CM | POA: Diagnosis not present

## 2018-01-15 DIAGNOSIS — R2681 Unsteadiness on feet: Secondary | ICD-10-CM | POA: Diagnosis not present

## 2018-01-15 DIAGNOSIS — M1710 Unilateral primary osteoarthritis, unspecified knee: Secondary | ICD-10-CM | POA: Diagnosis not present

## 2018-01-15 DIAGNOSIS — M6281 Muscle weakness (generalized): Secondary | ICD-10-CM | POA: Diagnosis not present

## 2018-01-18 DIAGNOSIS — D638 Anemia in other chronic diseases classified elsewhere: Secondary | ICD-10-CM | POA: Diagnosis not present

## 2018-01-18 DIAGNOSIS — I10 Essential (primary) hypertension: Secondary | ICD-10-CM | POA: Diagnosis not present

## 2018-01-18 DIAGNOSIS — G4733 Obstructive sleep apnea (adult) (pediatric): Secondary | ICD-10-CM | POA: Diagnosis not present

## 2018-01-18 DIAGNOSIS — M6281 Muscle weakness (generalized): Secondary | ICD-10-CM | POA: Diagnosis not present

## 2018-01-18 DIAGNOSIS — R2681 Unsteadiness on feet: Secondary | ICD-10-CM | POA: Diagnosis not present

## 2018-01-18 DIAGNOSIS — M1710 Unilateral primary osteoarthritis, unspecified knee: Secondary | ICD-10-CM | POA: Diagnosis not present

## 2018-01-18 DIAGNOSIS — H409 Unspecified glaucoma: Secondary | ICD-10-CM | POA: Diagnosis not present

## 2018-01-18 DIAGNOSIS — N184 Chronic kidney disease, stage 4 (severe): Secondary | ICD-10-CM | POA: Diagnosis not present

## 2018-01-18 DIAGNOSIS — E785 Hyperlipidemia, unspecified: Secondary | ICD-10-CM | POA: Diagnosis not present

## 2018-01-19 DIAGNOSIS — M6281 Muscle weakness (generalized): Secondary | ICD-10-CM | POA: Diagnosis not present

## 2018-01-19 DIAGNOSIS — R2681 Unsteadiness on feet: Secondary | ICD-10-CM | POA: Diagnosis not present

## 2018-01-19 DIAGNOSIS — M1710 Unilateral primary osteoarthritis, unspecified knee: Secondary | ICD-10-CM | POA: Diagnosis not present

## 2018-01-20 DIAGNOSIS — R2681 Unsteadiness on feet: Secondary | ICD-10-CM | POA: Diagnosis not present

## 2018-01-20 DIAGNOSIS — M6281 Muscle weakness (generalized): Secondary | ICD-10-CM | POA: Diagnosis not present

## 2018-01-20 DIAGNOSIS — M1710 Unilateral primary osteoarthritis, unspecified knee: Secondary | ICD-10-CM | POA: Diagnosis not present

## 2018-01-21 DIAGNOSIS — M6281 Muscle weakness (generalized): Secondary | ICD-10-CM | POA: Diagnosis not present

## 2018-01-21 DIAGNOSIS — R2681 Unsteadiness on feet: Secondary | ICD-10-CM | POA: Diagnosis not present

## 2018-01-21 DIAGNOSIS — M1710 Unilateral primary osteoarthritis, unspecified knee: Secondary | ICD-10-CM | POA: Diagnosis not present

## 2018-01-22 DIAGNOSIS — M6281 Muscle weakness (generalized): Secondary | ICD-10-CM | POA: Diagnosis not present

## 2018-01-22 DIAGNOSIS — M1710 Unilateral primary osteoarthritis, unspecified knee: Secondary | ICD-10-CM | POA: Diagnosis not present

## 2018-01-22 DIAGNOSIS — R2681 Unsteadiness on feet: Secondary | ICD-10-CM | POA: Diagnosis not present

## 2018-01-25 DIAGNOSIS — M6281 Muscle weakness (generalized): Secondary | ICD-10-CM | POA: Diagnosis not present

## 2018-01-25 DIAGNOSIS — M1710 Unilateral primary osteoarthritis, unspecified knee: Secondary | ICD-10-CM | POA: Diagnosis not present

## 2018-01-25 DIAGNOSIS — R2681 Unsteadiness on feet: Secondary | ICD-10-CM | POA: Diagnosis not present

## 2018-01-26 DIAGNOSIS — M1710 Unilateral primary osteoarthritis, unspecified knee: Secondary | ICD-10-CM | POA: Diagnosis not present

## 2018-01-26 DIAGNOSIS — M6281 Muscle weakness (generalized): Secondary | ICD-10-CM | POA: Diagnosis not present

## 2018-01-26 DIAGNOSIS — R2681 Unsteadiness on feet: Secondary | ICD-10-CM | POA: Diagnosis not present

## 2018-01-27 DIAGNOSIS — R2681 Unsteadiness on feet: Secondary | ICD-10-CM | POA: Diagnosis not present

## 2018-01-27 DIAGNOSIS — M1710 Unilateral primary osteoarthritis, unspecified knee: Secondary | ICD-10-CM | POA: Diagnosis not present

## 2018-01-27 DIAGNOSIS — M6281 Muscle weakness (generalized): Secondary | ICD-10-CM | POA: Diagnosis not present

## 2018-01-28 DIAGNOSIS — R2681 Unsteadiness on feet: Secondary | ICD-10-CM | POA: Diagnosis not present

## 2018-01-28 DIAGNOSIS — M6281 Muscle weakness (generalized): Secondary | ICD-10-CM | POA: Diagnosis not present

## 2018-01-28 DIAGNOSIS — M1710 Unilateral primary osteoarthritis, unspecified knee: Secondary | ICD-10-CM | POA: Diagnosis not present

## 2018-01-29 DIAGNOSIS — M6281 Muscle weakness (generalized): Secondary | ICD-10-CM | POA: Diagnosis not present

## 2018-01-29 DIAGNOSIS — M1710 Unilateral primary osteoarthritis, unspecified knee: Secondary | ICD-10-CM | POA: Diagnosis not present

## 2018-01-29 DIAGNOSIS — R2681 Unsteadiness on feet: Secondary | ICD-10-CM | POA: Diagnosis not present

## 2018-02-01 DIAGNOSIS — M1710 Unilateral primary osteoarthritis, unspecified knee: Secondary | ICD-10-CM | POA: Diagnosis not present

## 2018-02-01 DIAGNOSIS — M6281 Muscle weakness (generalized): Secondary | ICD-10-CM | POA: Diagnosis not present

## 2018-02-01 DIAGNOSIS — R2681 Unsteadiness on feet: Secondary | ICD-10-CM | POA: Diagnosis not present

## 2018-02-02 DIAGNOSIS — M6281 Muscle weakness (generalized): Secondary | ICD-10-CM | POA: Diagnosis not present

## 2018-02-02 DIAGNOSIS — R944 Abnormal results of kidney function studies: Secondary | ICD-10-CM | POA: Diagnosis not present

## 2018-02-02 DIAGNOSIS — E612 Magnesium deficiency: Secondary | ICD-10-CM | POA: Diagnosis not present

## 2018-02-02 DIAGNOSIS — M1710 Unilateral primary osteoarthritis, unspecified knee: Secondary | ICD-10-CM | POA: Diagnosis not present

## 2018-02-02 DIAGNOSIS — R2681 Unsteadiness on feet: Secondary | ICD-10-CM | POA: Diagnosis not present

## 2018-02-02 DIAGNOSIS — R6889 Other general symptoms and signs: Secondary | ICD-10-CM | POA: Diagnosis not present

## 2018-02-03 DIAGNOSIS — M1710 Unilateral primary osteoarthritis, unspecified knee: Secondary | ICD-10-CM | POA: Diagnosis not present

## 2018-02-03 DIAGNOSIS — M6281 Muscle weakness (generalized): Secondary | ICD-10-CM | POA: Diagnosis not present

## 2018-02-03 DIAGNOSIS — R2681 Unsteadiness on feet: Secondary | ICD-10-CM | POA: Diagnosis not present

## 2018-02-04 DIAGNOSIS — R2681 Unsteadiness on feet: Secondary | ICD-10-CM | POA: Diagnosis not present

## 2018-02-04 DIAGNOSIS — M6281 Muscle weakness (generalized): Secondary | ICD-10-CM | POA: Diagnosis not present

## 2018-02-04 DIAGNOSIS — M1710 Unilateral primary osteoarthritis, unspecified knee: Secondary | ICD-10-CM | POA: Diagnosis not present

## 2018-02-05 DIAGNOSIS — M6281 Muscle weakness (generalized): Secondary | ICD-10-CM | POA: Diagnosis not present

## 2018-02-05 DIAGNOSIS — R2681 Unsteadiness on feet: Secondary | ICD-10-CM | POA: Diagnosis not present

## 2018-02-05 DIAGNOSIS — M1710 Unilateral primary osteoarthritis, unspecified knee: Secondary | ICD-10-CM | POA: Diagnosis not present

## 2018-02-08 DIAGNOSIS — M6281 Muscle weakness (generalized): Secondary | ICD-10-CM | POA: Diagnosis not present

## 2018-02-08 DIAGNOSIS — R2681 Unsteadiness on feet: Secondary | ICD-10-CM | POA: Diagnosis not present

## 2018-02-08 DIAGNOSIS — M1710 Unilateral primary osteoarthritis, unspecified knee: Secondary | ICD-10-CM | POA: Diagnosis not present

## 2018-02-09 DIAGNOSIS — R2681 Unsteadiness on feet: Secondary | ICD-10-CM | POA: Diagnosis not present

## 2018-02-09 DIAGNOSIS — M6281 Muscle weakness (generalized): Secondary | ICD-10-CM | POA: Diagnosis not present

## 2018-02-09 DIAGNOSIS — M1710 Unilateral primary osteoarthritis, unspecified knee: Secondary | ICD-10-CM | POA: Diagnosis not present

## 2018-02-10 DIAGNOSIS — R2681 Unsteadiness on feet: Secondary | ICD-10-CM | POA: Diagnosis not present

## 2018-02-10 DIAGNOSIS — M1710 Unilateral primary osteoarthritis, unspecified knee: Secondary | ICD-10-CM | POA: Diagnosis not present

## 2018-02-10 DIAGNOSIS — M6281 Muscle weakness (generalized): Secondary | ICD-10-CM | POA: Diagnosis not present

## 2018-02-11 DIAGNOSIS — M1710 Unilateral primary osteoarthritis, unspecified knee: Secondary | ICD-10-CM | POA: Diagnosis not present

## 2018-02-11 DIAGNOSIS — R2681 Unsteadiness on feet: Secondary | ICD-10-CM | POA: Diagnosis not present

## 2018-02-11 DIAGNOSIS — M6281 Muscle weakness (generalized): Secondary | ICD-10-CM | POA: Diagnosis not present

## 2018-02-12 DIAGNOSIS — R2681 Unsteadiness on feet: Secondary | ICD-10-CM | POA: Diagnosis not present

## 2018-02-12 DIAGNOSIS — M1710 Unilateral primary osteoarthritis, unspecified knee: Secondary | ICD-10-CM | POA: Diagnosis not present

## 2018-02-12 DIAGNOSIS — M6281 Muscle weakness (generalized): Secondary | ICD-10-CM | POA: Diagnosis not present

## 2018-02-15 DIAGNOSIS — M6281 Muscle weakness (generalized): Secondary | ICD-10-CM | POA: Diagnosis not present

## 2018-02-15 DIAGNOSIS — M1710 Unilateral primary osteoarthritis, unspecified knee: Secondary | ICD-10-CM | POA: Diagnosis not present

## 2018-02-15 DIAGNOSIS — R2681 Unsteadiness on feet: Secondary | ICD-10-CM | POA: Diagnosis not present

## 2018-02-16 DIAGNOSIS — M1710 Unilateral primary osteoarthritis, unspecified knee: Secondary | ICD-10-CM | POA: Diagnosis not present

## 2018-02-16 DIAGNOSIS — R2681 Unsteadiness on feet: Secondary | ICD-10-CM | POA: Diagnosis not present

## 2018-02-16 DIAGNOSIS — M6281 Muscle weakness (generalized): Secondary | ICD-10-CM | POA: Diagnosis not present

## 2018-02-18 DIAGNOSIS — M6281 Muscle weakness (generalized): Secondary | ICD-10-CM | POA: Diagnosis not present

## 2018-02-18 DIAGNOSIS — R2681 Unsteadiness on feet: Secondary | ICD-10-CM | POA: Diagnosis not present

## 2018-02-18 DIAGNOSIS — M1710 Unilateral primary osteoarthritis, unspecified knee: Secondary | ICD-10-CM | POA: Diagnosis not present

## 2018-02-22 DIAGNOSIS — M1710 Unilateral primary osteoarthritis, unspecified knee: Secondary | ICD-10-CM | POA: Diagnosis not present

## 2018-02-22 DIAGNOSIS — R2681 Unsteadiness on feet: Secondary | ICD-10-CM | POA: Diagnosis not present

## 2018-02-22 DIAGNOSIS — M6281 Muscle weakness (generalized): Secondary | ICD-10-CM | POA: Diagnosis not present

## 2018-02-24 DIAGNOSIS — R2681 Unsteadiness on feet: Secondary | ICD-10-CM | POA: Diagnosis not present

## 2018-02-24 DIAGNOSIS — M6281 Muscle weakness (generalized): Secondary | ICD-10-CM | POA: Diagnosis not present

## 2018-02-24 DIAGNOSIS — M1710 Unilateral primary osteoarthritis, unspecified knee: Secondary | ICD-10-CM | POA: Diagnosis not present

## 2018-02-25 DIAGNOSIS — M1710 Unilateral primary osteoarthritis, unspecified knee: Secondary | ICD-10-CM | POA: Diagnosis not present

## 2018-02-25 DIAGNOSIS — M6281 Muscle weakness (generalized): Secondary | ICD-10-CM | POA: Diagnosis not present

## 2018-02-25 DIAGNOSIS — R2681 Unsteadiness on feet: Secondary | ICD-10-CM | POA: Diagnosis not present

## 2018-02-26 DIAGNOSIS — M6281 Muscle weakness (generalized): Secondary | ICD-10-CM | POA: Diagnosis not present

## 2018-02-26 DIAGNOSIS — R2681 Unsteadiness on feet: Secondary | ICD-10-CM | POA: Diagnosis not present

## 2018-02-26 DIAGNOSIS — M1710 Unilateral primary osteoarthritis, unspecified knee: Secondary | ICD-10-CM | POA: Diagnosis not present

## 2018-03-02 DIAGNOSIS — R197 Diarrhea, unspecified: Secondary | ICD-10-CM | POA: Diagnosis not present

## 2018-03-03 DIAGNOSIS — R197 Diarrhea, unspecified: Secondary | ICD-10-CM | POA: Diagnosis not present

## 2018-03-18 DIAGNOSIS — D3132 Benign neoplasm of left choroid: Secondary | ICD-10-CM | POA: Diagnosis not present

## 2018-03-18 DIAGNOSIS — E113413 Type 2 diabetes mellitus with severe nonproliferative diabetic retinopathy with macular edema, bilateral: Secondary | ICD-10-CM | POA: Diagnosis not present

## 2018-03-18 DIAGNOSIS — H353132 Nonexudative age-related macular degeneration, bilateral, intermediate dry stage: Secondary | ICD-10-CM | POA: Diagnosis not present

## 2018-03-18 DIAGNOSIS — H35372 Puckering of macula, left eye: Secondary | ICD-10-CM | POA: Diagnosis not present

## 2018-06-11 DIAGNOSIS — J449 Chronic obstructive pulmonary disease, unspecified: Secondary | ICD-10-CM | POA: Diagnosis not present

## 2018-06-11 DIAGNOSIS — I119 Hypertensive heart disease without heart failure: Secondary | ICD-10-CM | POA: Diagnosis not present

## 2018-06-11 DIAGNOSIS — R0602 Shortness of breath: Secondary | ICD-10-CM | POA: Diagnosis not present

## 2018-06-11 DIAGNOSIS — I350 Nonrheumatic aortic (valve) stenosis: Secondary | ICD-10-CM | POA: Diagnosis not present

## 2018-06-11 DIAGNOSIS — E668 Other obesity: Secondary | ICD-10-CM | POA: Diagnosis not present

## 2018-06-11 DIAGNOSIS — N184 Chronic kidney disease, stage 4 (severe): Secondary | ICD-10-CM | POA: Diagnosis not present

## 2018-06-11 DIAGNOSIS — E119 Type 2 diabetes mellitus without complications: Secondary | ICD-10-CM | POA: Diagnosis not present

## 2018-06-11 DIAGNOSIS — E785 Hyperlipidemia, unspecified: Secondary | ICD-10-CM | POA: Diagnosis not present

## 2018-06-14 DIAGNOSIS — I359 Nonrheumatic aortic valve disorder, unspecified: Secondary | ICD-10-CM | POA: Diagnosis not present

## 2018-06-15 DIAGNOSIS — N185 Chronic kidney disease, stage 5: Secondary | ICD-10-CM | POA: Diagnosis not present

## 2018-06-15 DIAGNOSIS — D631 Anemia in chronic kidney disease: Secondary | ICD-10-CM | POA: Diagnosis not present

## 2018-06-15 DIAGNOSIS — N2581 Secondary hyperparathyroidism of renal origin: Secondary | ICD-10-CM | POA: Diagnosis not present

## 2018-06-15 DIAGNOSIS — N189 Chronic kidney disease, unspecified: Secondary | ICD-10-CM | POA: Diagnosis not present

## 2018-06-30 DIAGNOSIS — R488 Other symbolic dysfunctions: Secondary | ICD-10-CM | POA: Diagnosis not present

## 2018-06-30 DIAGNOSIS — R41841 Cognitive communication deficit: Secondary | ICD-10-CM | POA: Diagnosis not present

## 2018-07-01 DIAGNOSIS — R296 Repeated falls: Secondary | ICD-10-CM | POA: Diagnosis not present

## 2018-07-01 DIAGNOSIS — M6281 Muscle weakness (generalized): Secondary | ICD-10-CM | POA: Diagnosis not present

## 2018-07-01 DIAGNOSIS — R41841 Cognitive communication deficit: Secondary | ICD-10-CM | POA: Diagnosis not present

## 2018-07-01 DIAGNOSIS — N3946 Mixed incontinence: Secondary | ICD-10-CM | POA: Diagnosis not present

## 2018-07-01 DIAGNOSIS — R2681 Unsteadiness on feet: Secondary | ICD-10-CM | POA: Diagnosis not present

## 2018-07-01 DIAGNOSIS — M25561 Pain in right knee: Secondary | ICD-10-CM | POA: Diagnosis not present

## 2018-07-01 DIAGNOSIS — R488 Other symbolic dysfunctions: Secondary | ICD-10-CM | POA: Diagnosis not present

## 2018-07-02 DIAGNOSIS — R2681 Unsteadiness on feet: Secondary | ICD-10-CM | POA: Diagnosis not present

## 2018-07-02 DIAGNOSIS — M25561 Pain in right knee: Secondary | ICD-10-CM | POA: Diagnosis not present

## 2018-07-02 DIAGNOSIS — R41841 Cognitive communication deficit: Secondary | ICD-10-CM | POA: Diagnosis not present

## 2018-07-02 DIAGNOSIS — R296 Repeated falls: Secondary | ICD-10-CM | POA: Diagnosis not present

## 2018-07-02 DIAGNOSIS — M6281 Muscle weakness (generalized): Secondary | ICD-10-CM | POA: Diagnosis not present

## 2018-07-02 DIAGNOSIS — N3946 Mixed incontinence: Secondary | ICD-10-CM | POA: Diagnosis not present

## 2018-07-05 DIAGNOSIS — M6281 Muscle weakness (generalized): Secondary | ICD-10-CM | POA: Diagnosis not present

## 2018-07-05 DIAGNOSIS — R41841 Cognitive communication deficit: Secondary | ICD-10-CM | POA: Diagnosis not present

## 2018-07-05 DIAGNOSIS — N3946 Mixed incontinence: Secondary | ICD-10-CM | POA: Diagnosis not present

## 2018-07-05 DIAGNOSIS — M25561 Pain in right knee: Secondary | ICD-10-CM | POA: Diagnosis not present

## 2018-07-05 DIAGNOSIS — R2681 Unsteadiness on feet: Secondary | ICD-10-CM | POA: Diagnosis not present

## 2018-07-05 DIAGNOSIS — R296 Repeated falls: Secondary | ICD-10-CM | POA: Diagnosis not present

## 2018-07-06 DIAGNOSIS — R41841 Cognitive communication deficit: Secondary | ICD-10-CM | POA: Diagnosis not present

## 2018-07-06 DIAGNOSIS — M6281 Muscle weakness (generalized): Secondary | ICD-10-CM | POA: Diagnosis not present

## 2018-07-06 DIAGNOSIS — R2681 Unsteadiness on feet: Secondary | ICD-10-CM | POA: Diagnosis not present

## 2018-07-06 DIAGNOSIS — N3946 Mixed incontinence: Secondary | ICD-10-CM | POA: Diagnosis not present

## 2018-07-06 DIAGNOSIS — M25561 Pain in right knee: Secondary | ICD-10-CM | POA: Diagnosis not present

## 2018-07-06 DIAGNOSIS — R296 Repeated falls: Secondary | ICD-10-CM | POA: Diagnosis not present

## 2018-07-07 DIAGNOSIS — N3946 Mixed incontinence: Secondary | ICD-10-CM | POA: Diagnosis not present

## 2018-07-07 DIAGNOSIS — M6281 Muscle weakness (generalized): Secondary | ICD-10-CM | POA: Diagnosis not present

## 2018-07-07 DIAGNOSIS — R296 Repeated falls: Secondary | ICD-10-CM | POA: Diagnosis not present

## 2018-07-07 DIAGNOSIS — R41841 Cognitive communication deficit: Secondary | ICD-10-CM | POA: Diagnosis not present

## 2018-07-07 DIAGNOSIS — M25561 Pain in right knee: Secondary | ICD-10-CM | POA: Diagnosis not present

## 2018-07-07 DIAGNOSIS — R2681 Unsteadiness on feet: Secondary | ICD-10-CM | POA: Diagnosis not present

## 2018-07-08 DIAGNOSIS — M25561 Pain in right knee: Secondary | ICD-10-CM | POA: Diagnosis not present

## 2018-07-08 DIAGNOSIS — R296 Repeated falls: Secondary | ICD-10-CM | POA: Diagnosis not present

## 2018-07-08 DIAGNOSIS — D631 Anemia in chronic kidney disease: Secondary | ICD-10-CM | POA: Diagnosis not present

## 2018-07-08 DIAGNOSIS — N185 Chronic kidney disease, stage 5: Secondary | ICD-10-CM | POA: Diagnosis not present

## 2018-07-08 DIAGNOSIS — R41841 Cognitive communication deficit: Secondary | ICD-10-CM | POA: Diagnosis not present

## 2018-07-08 DIAGNOSIS — M6281 Muscle weakness (generalized): Secondary | ICD-10-CM | POA: Diagnosis not present

## 2018-07-08 DIAGNOSIS — R2681 Unsteadiness on feet: Secondary | ICD-10-CM | POA: Diagnosis not present

## 2018-07-08 DIAGNOSIS — I120 Hypertensive chronic kidney disease with stage 5 chronic kidney disease or end stage renal disease: Secondary | ICD-10-CM | POA: Diagnosis not present

## 2018-07-08 DIAGNOSIS — N3946 Mixed incontinence: Secondary | ICD-10-CM | POA: Diagnosis not present

## 2018-07-08 DIAGNOSIS — N2581 Secondary hyperparathyroidism of renal origin: Secondary | ICD-10-CM | POA: Diagnosis not present

## 2018-07-09 DIAGNOSIS — R41841 Cognitive communication deficit: Secondary | ICD-10-CM | POA: Diagnosis not present

## 2018-07-09 DIAGNOSIS — M6281 Muscle weakness (generalized): Secondary | ICD-10-CM | POA: Diagnosis not present

## 2018-07-09 DIAGNOSIS — N3946 Mixed incontinence: Secondary | ICD-10-CM | POA: Diagnosis not present

## 2018-07-09 DIAGNOSIS — M25561 Pain in right knee: Secondary | ICD-10-CM | POA: Diagnosis not present

## 2018-07-09 DIAGNOSIS — R296 Repeated falls: Secondary | ICD-10-CM | POA: Diagnosis not present

## 2018-07-09 DIAGNOSIS — R2681 Unsteadiness on feet: Secondary | ICD-10-CM | POA: Diagnosis not present

## 2018-07-12 DIAGNOSIS — R41841 Cognitive communication deficit: Secondary | ICD-10-CM | POA: Diagnosis not present

## 2018-07-12 DIAGNOSIS — M25561 Pain in right knee: Secondary | ICD-10-CM | POA: Diagnosis not present

## 2018-07-12 DIAGNOSIS — N3946 Mixed incontinence: Secondary | ICD-10-CM | POA: Diagnosis not present

## 2018-07-12 DIAGNOSIS — M6281 Muscle weakness (generalized): Secondary | ICD-10-CM | POA: Diagnosis not present

## 2018-07-12 DIAGNOSIS — R296 Repeated falls: Secondary | ICD-10-CM | POA: Diagnosis not present

## 2018-07-12 DIAGNOSIS — R2681 Unsteadiness on feet: Secondary | ICD-10-CM | POA: Diagnosis not present

## 2018-07-14 DIAGNOSIS — R41841 Cognitive communication deficit: Secondary | ICD-10-CM | POA: Diagnosis not present

## 2018-07-14 DIAGNOSIS — R296 Repeated falls: Secondary | ICD-10-CM | POA: Diagnosis not present

## 2018-07-14 DIAGNOSIS — N3946 Mixed incontinence: Secondary | ICD-10-CM | POA: Diagnosis not present

## 2018-07-14 DIAGNOSIS — M25561 Pain in right knee: Secondary | ICD-10-CM | POA: Diagnosis not present

## 2018-07-14 DIAGNOSIS — M6281 Muscle weakness (generalized): Secondary | ICD-10-CM | POA: Diagnosis not present

## 2018-07-14 DIAGNOSIS — R2681 Unsteadiness on feet: Secondary | ICD-10-CM | POA: Diagnosis not present

## 2018-07-15 DIAGNOSIS — R488 Other symbolic dysfunctions: Secondary | ICD-10-CM | POA: Diagnosis not present

## 2018-07-15 DIAGNOSIS — R2681 Unsteadiness on feet: Secondary | ICD-10-CM | POA: Diagnosis not present

## 2018-07-15 DIAGNOSIS — M6281 Muscle weakness (generalized): Secondary | ICD-10-CM | POA: Diagnosis not present

## 2018-07-15 DIAGNOSIS — M25561 Pain in right knee: Secondary | ICD-10-CM | POA: Diagnosis not present

## 2018-07-15 DIAGNOSIS — R41841 Cognitive communication deficit: Secondary | ICD-10-CM | POA: Diagnosis not present

## 2018-07-15 DIAGNOSIS — R296 Repeated falls: Secondary | ICD-10-CM | POA: Diagnosis not present

## 2018-07-15 DIAGNOSIS — N3946 Mixed incontinence: Secondary | ICD-10-CM | POA: Diagnosis not present

## 2018-07-16 DIAGNOSIS — M25561 Pain in right knee: Secondary | ICD-10-CM | POA: Diagnosis not present

## 2018-07-16 DIAGNOSIS — M6281 Muscle weakness (generalized): Secondary | ICD-10-CM | POA: Diagnosis not present

## 2018-07-16 DIAGNOSIS — R41841 Cognitive communication deficit: Secondary | ICD-10-CM | POA: Diagnosis not present

## 2018-07-16 DIAGNOSIS — R2681 Unsteadiness on feet: Secondary | ICD-10-CM | POA: Diagnosis not present

## 2018-07-16 DIAGNOSIS — N3946 Mixed incontinence: Secondary | ICD-10-CM | POA: Diagnosis not present

## 2018-07-16 DIAGNOSIS — R296 Repeated falls: Secondary | ICD-10-CM | POA: Diagnosis not present

## 2018-07-19 DIAGNOSIS — N3946 Mixed incontinence: Secondary | ICD-10-CM | POA: Diagnosis not present

## 2018-07-19 DIAGNOSIS — R2681 Unsteadiness on feet: Secondary | ICD-10-CM | POA: Diagnosis not present

## 2018-07-19 DIAGNOSIS — M25561 Pain in right knee: Secondary | ICD-10-CM | POA: Diagnosis not present

## 2018-07-19 DIAGNOSIS — R296 Repeated falls: Secondary | ICD-10-CM | POA: Diagnosis not present

## 2018-07-19 DIAGNOSIS — R41841 Cognitive communication deficit: Secondary | ICD-10-CM | POA: Diagnosis not present

## 2018-07-19 DIAGNOSIS — M6281 Muscle weakness (generalized): Secondary | ICD-10-CM | POA: Diagnosis not present

## 2018-07-20 DIAGNOSIS — R41841 Cognitive communication deficit: Secondary | ICD-10-CM | POA: Diagnosis not present

## 2018-07-20 DIAGNOSIS — M6281 Muscle weakness (generalized): Secondary | ICD-10-CM | POA: Diagnosis not present

## 2018-07-20 DIAGNOSIS — M25561 Pain in right knee: Secondary | ICD-10-CM | POA: Diagnosis not present

## 2018-07-20 DIAGNOSIS — R2681 Unsteadiness on feet: Secondary | ICD-10-CM | POA: Diagnosis not present

## 2018-07-20 DIAGNOSIS — R296 Repeated falls: Secondary | ICD-10-CM | POA: Diagnosis not present

## 2018-07-20 DIAGNOSIS — N3946 Mixed incontinence: Secondary | ICD-10-CM | POA: Diagnosis not present

## 2018-07-21 DIAGNOSIS — R2681 Unsteadiness on feet: Secondary | ICD-10-CM | POA: Diagnosis not present

## 2018-07-21 DIAGNOSIS — R296 Repeated falls: Secondary | ICD-10-CM | POA: Diagnosis not present

## 2018-07-21 DIAGNOSIS — M25561 Pain in right knee: Secondary | ICD-10-CM | POA: Diagnosis not present

## 2018-07-21 DIAGNOSIS — M6281 Muscle weakness (generalized): Secondary | ICD-10-CM | POA: Diagnosis not present

## 2018-07-21 DIAGNOSIS — R41841 Cognitive communication deficit: Secondary | ICD-10-CM | POA: Diagnosis not present

## 2018-07-21 DIAGNOSIS — N3946 Mixed incontinence: Secondary | ICD-10-CM | POA: Diagnosis not present

## 2018-07-22 DIAGNOSIS — H35033 Hypertensive retinopathy, bilateral: Secondary | ICD-10-CM | POA: Diagnosis not present

## 2018-07-22 DIAGNOSIS — D3132 Benign neoplasm of left choroid: Secondary | ICD-10-CM | POA: Diagnosis not present

## 2018-07-22 DIAGNOSIS — E113413 Type 2 diabetes mellitus with severe nonproliferative diabetic retinopathy with macular edema, bilateral: Secondary | ICD-10-CM | POA: Diagnosis not present

## 2018-07-22 DIAGNOSIS — H35372 Puckering of macula, left eye: Secondary | ICD-10-CM | POA: Diagnosis not present

## 2018-07-23 DIAGNOSIS — M25561 Pain in right knee: Secondary | ICD-10-CM | POA: Diagnosis not present

## 2018-07-23 DIAGNOSIS — R41841 Cognitive communication deficit: Secondary | ICD-10-CM | POA: Diagnosis not present

## 2018-07-23 DIAGNOSIS — R2681 Unsteadiness on feet: Secondary | ICD-10-CM | POA: Diagnosis not present

## 2018-07-23 DIAGNOSIS — R296 Repeated falls: Secondary | ICD-10-CM | POA: Diagnosis not present

## 2018-07-23 DIAGNOSIS — N3946 Mixed incontinence: Secondary | ICD-10-CM | POA: Diagnosis not present

## 2018-07-23 DIAGNOSIS — M6281 Muscle weakness (generalized): Secondary | ICD-10-CM | POA: Diagnosis not present

## 2018-07-26 DIAGNOSIS — R41841 Cognitive communication deficit: Secondary | ICD-10-CM | POA: Diagnosis not present

## 2018-07-26 DIAGNOSIS — M25561 Pain in right knee: Secondary | ICD-10-CM | POA: Diagnosis not present

## 2018-07-26 DIAGNOSIS — R2681 Unsteadiness on feet: Secondary | ICD-10-CM | POA: Diagnosis not present

## 2018-07-26 DIAGNOSIS — M6281 Muscle weakness (generalized): Secondary | ICD-10-CM | POA: Diagnosis not present

## 2018-07-26 DIAGNOSIS — R296 Repeated falls: Secondary | ICD-10-CM | POA: Diagnosis not present

## 2018-07-26 DIAGNOSIS — N3946 Mixed incontinence: Secondary | ICD-10-CM | POA: Diagnosis not present

## 2018-07-27 DIAGNOSIS — M6281 Muscle weakness (generalized): Secondary | ICD-10-CM | POA: Diagnosis not present

## 2018-07-27 DIAGNOSIS — R296 Repeated falls: Secondary | ICD-10-CM | POA: Diagnosis not present

## 2018-07-27 DIAGNOSIS — M25561 Pain in right knee: Secondary | ICD-10-CM | POA: Diagnosis not present

## 2018-07-27 DIAGNOSIS — N3946 Mixed incontinence: Secondary | ICD-10-CM | POA: Diagnosis not present

## 2018-07-27 DIAGNOSIS — R2681 Unsteadiness on feet: Secondary | ICD-10-CM | POA: Diagnosis not present

## 2018-07-27 DIAGNOSIS — R41841 Cognitive communication deficit: Secondary | ICD-10-CM | POA: Diagnosis not present

## 2018-07-28 DIAGNOSIS — M25561 Pain in right knee: Secondary | ICD-10-CM | POA: Diagnosis not present

## 2018-07-28 DIAGNOSIS — R2681 Unsteadiness on feet: Secondary | ICD-10-CM | POA: Diagnosis not present

## 2018-07-28 DIAGNOSIS — M6281 Muscle weakness (generalized): Secondary | ICD-10-CM | POA: Diagnosis not present

## 2018-07-28 DIAGNOSIS — R41841 Cognitive communication deficit: Secondary | ICD-10-CM | POA: Diagnosis not present

## 2018-07-28 DIAGNOSIS — R296 Repeated falls: Secondary | ICD-10-CM | POA: Diagnosis not present

## 2018-07-28 DIAGNOSIS — N3946 Mixed incontinence: Secondary | ICD-10-CM | POA: Diagnosis not present

## 2018-07-29 DIAGNOSIS — M25561 Pain in right knee: Secondary | ICD-10-CM | POA: Diagnosis not present

## 2018-07-29 DIAGNOSIS — R2681 Unsteadiness on feet: Secondary | ICD-10-CM | POA: Diagnosis not present

## 2018-07-29 DIAGNOSIS — N3946 Mixed incontinence: Secondary | ICD-10-CM | POA: Diagnosis not present

## 2018-07-29 DIAGNOSIS — R41841 Cognitive communication deficit: Secondary | ICD-10-CM | POA: Diagnosis not present

## 2018-07-29 DIAGNOSIS — M6281 Muscle weakness (generalized): Secondary | ICD-10-CM | POA: Diagnosis not present

## 2018-07-29 DIAGNOSIS — R296 Repeated falls: Secondary | ICD-10-CM | POA: Diagnosis not present

## 2018-07-30 DIAGNOSIS — N3946 Mixed incontinence: Secondary | ICD-10-CM | POA: Diagnosis not present

## 2018-07-30 DIAGNOSIS — R41841 Cognitive communication deficit: Secondary | ICD-10-CM | POA: Diagnosis not present

## 2018-07-30 DIAGNOSIS — M6281 Muscle weakness (generalized): Secondary | ICD-10-CM | POA: Diagnosis not present

## 2018-07-30 DIAGNOSIS — M25561 Pain in right knee: Secondary | ICD-10-CM | POA: Diagnosis not present

## 2018-07-30 DIAGNOSIS — R296 Repeated falls: Secondary | ICD-10-CM | POA: Diagnosis not present

## 2018-07-30 DIAGNOSIS — R2681 Unsteadiness on feet: Secondary | ICD-10-CM | POA: Diagnosis not present

## 2018-08-02 DIAGNOSIS — N3946 Mixed incontinence: Secondary | ICD-10-CM | POA: Diagnosis not present

## 2018-08-02 DIAGNOSIS — R296 Repeated falls: Secondary | ICD-10-CM | POA: Diagnosis not present

## 2018-08-02 DIAGNOSIS — M6281 Muscle weakness (generalized): Secondary | ICD-10-CM | POA: Diagnosis not present

## 2018-08-02 DIAGNOSIS — R2681 Unsteadiness on feet: Secondary | ICD-10-CM | POA: Diagnosis not present

## 2018-08-02 DIAGNOSIS — R41841 Cognitive communication deficit: Secondary | ICD-10-CM | POA: Diagnosis not present

## 2018-08-02 DIAGNOSIS — M25561 Pain in right knee: Secondary | ICD-10-CM | POA: Diagnosis not present

## 2018-08-03 DIAGNOSIS — R41841 Cognitive communication deficit: Secondary | ICD-10-CM | POA: Diagnosis not present

## 2018-08-03 DIAGNOSIS — M6281 Muscle weakness (generalized): Secondary | ICD-10-CM | POA: Diagnosis not present

## 2018-08-03 DIAGNOSIS — R296 Repeated falls: Secondary | ICD-10-CM | POA: Diagnosis not present

## 2018-08-03 DIAGNOSIS — N3946 Mixed incontinence: Secondary | ICD-10-CM | POA: Diagnosis not present

## 2018-08-03 DIAGNOSIS — R2681 Unsteadiness on feet: Secondary | ICD-10-CM | POA: Diagnosis not present

## 2018-08-03 DIAGNOSIS — M25561 Pain in right knee: Secondary | ICD-10-CM | POA: Diagnosis not present

## 2018-08-04 DIAGNOSIS — R296 Repeated falls: Secondary | ICD-10-CM | POA: Diagnosis not present

## 2018-08-04 DIAGNOSIS — M25561 Pain in right knee: Secondary | ICD-10-CM | POA: Diagnosis not present

## 2018-08-04 DIAGNOSIS — R2681 Unsteadiness on feet: Secondary | ICD-10-CM | POA: Diagnosis not present

## 2018-08-04 DIAGNOSIS — N3946 Mixed incontinence: Secondary | ICD-10-CM | POA: Diagnosis not present

## 2018-08-04 DIAGNOSIS — M6281 Muscle weakness (generalized): Secondary | ICD-10-CM | POA: Diagnosis not present

## 2018-08-04 DIAGNOSIS — R41841 Cognitive communication deficit: Secondary | ICD-10-CM | POA: Diagnosis not present

## 2018-08-05 DIAGNOSIS — M25561 Pain in right knee: Secondary | ICD-10-CM | POA: Diagnosis not present

## 2018-08-05 DIAGNOSIS — N3946 Mixed incontinence: Secondary | ICD-10-CM | POA: Diagnosis not present

## 2018-08-05 DIAGNOSIS — R296 Repeated falls: Secondary | ICD-10-CM | POA: Diagnosis not present

## 2018-08-05 DIAGNOSIS — R41841 Cognitive communication deficit: Secondary | ICD-10-CM | POA: Diagnosis not present

## 2018-08-05 DIAGNOSIS — R2681 Unsteadiness on feet: Secondary | ICD-10-CM | POA: Diagnosis not present

## 2018-08-05 DIAGNOSIS — M6281 Muscle weakness (generalized): Secondary | ICD-10-CM | POA: Diagnosis not present

## 2018-08-06 DIAGNOSIS — R2681 Unsteadiness on feet: Secondary | ICD-10-CM | POA: Diagnosis not present

## 2018-08-06 DIAGNOSIS — R41841 Cognitive communication deficit: Secondary | ICD-10-CM | POA: Diagnosis not present

## 2018-08-06 DIAGNOSIS — M25561 Pain in right knee: Secondary | ICD-10-CM | POA: Diagnosis not present

## 2018-08-06 DIAGNOSIS — M6281 Muscle weakness (generalized): Secondary | ICD-10-CM | POA: Diagnosis not present

## 2018-08-06 DIAGNOSIS — N3946 Mixed incontinence: Secondary | ICD-10-CM | POA: Diagnosis not present

## 2018-08-06 DIAGNOSIS — R296 Repeated falls: Secondary | ICD-10-CM | POA: Diagnosis not present

## 2018-08-09 DIAGNOSIS — R2681 Unsteadiness on feet: Secondary | ICD-10-CM | POA: Diagnosis not present

## 2018-08-09 DIAGNOSIS — M6281 Muscle weakness (generalized): Secondary | ICD-10-CM | POA: Diagnosis not present

## 2018-08-09 DIAGNOSIS — R41841 Cognitive communication deficit: Secondary | ICD-10-CM | POA: Diagnosis not present

## 2018-08-09 DIAGNOSIS — N3946 Mixed incontinence: Secondary | ICD-10-CM | POA: Diagnosis not present

## 2018-08-09 DIAGNOSIS — M25561 Pain in right knee: Secondary | ICD-10-CM | POA: Diagnosis not present

## 2018-08-09 DIAGNOSIS — R296 Repeated falls: Secondary | ICD-10-CM | POA: Diagnosis not present

## 2018-08-10 DIAGNOSIS — R2681 Unsteadiness on feet: Secondary | ICD-10-CM | POA: Diagnosis not present

## 2018-08-10 DIAGNOSIS — M6281 Muscle weakness (generalized): Secondary | ICD-10-CM | POA: Diagnosis not present

## 2018-08-10 DIAGNOSIS — M25561 Pain in right knee: Secondary | ICD-10-CM | POA: Diagnosis not present

## 2018-08-10 DIAGNOSIS — R41841 Cognitive communication deficit: Secondary | ICD-10-CM | POA: Diagnosis not present

## 2018-08-10 DIAGNOSIS — N3946 Mixed incontinence: Secondary | ICD-10-CM | POA: Diagnosis not present

## 2018-08-10 DIAGNOSIS — R296 Repeated falls: Secondary | ICD-10-CM | POA: Diagnosis not present

## 2018-08-11 DIAGNOSIS — M6281 Muscle weakness (generalized): Secondary | ICD-10-CM | POA: Diagnosis not present

## 2018-08-11 DIAGNOSIS — R296 Repeated falls: Secondary | ICD-10-CM | POA: Diagnosis not present

## 2018-08-11 DIAGNOSIS — N3946 Mixed incontinence: Secondary | ICD-10-CM | POA: Diagnosis not present

## 2018-08-11 DIAGNOSIS — R41841 Cognitive communication deficit: Secondary | ICD-10-CM | POA: Diagnosis not present

## 2018-08-11 DIAGNOSIS — M25561 Pain in right knee: Secondary | ICD-10-CM | POA: Diagnosis not present

## 2018-08-11 DIAGNOSIS — R2681 Unsteadiness on feet: Secondary | ICD-10-CM | POA: Diagnosis not present

## 2018-08-12 DIAGNOSIS — R41841 Cognitive communication deficit: Secondary | ICD-10-CM | POA: Diagnosis not present

## 2018-08-12 DIAGNOSIS — M25561 Pain in right knee: Secondary | ICD-10-CM | POA: Diagnosis not present

## 2018-08-12 DIAGNOSIS — M6281 Muscle weakness (generalized): Secondary | ICD-10-CM | POA: Diagnosis not present

## 2018-08-12 DIAGNOSIS — N3946 Mixed incontinence: Secondary | ICD-10-CM | POA: Diagnosis not present

## 2018-08-12 DIAGNOSIS — R296 Repeated falls: Secondary | ICD-10-CM | POA: Diagnosis not present

## 2018-08-12 DIAGNOSIS — R2681 Unsteadiness on feet: Secondary | ICD-10-CM | POA: Diagnosis not present

## 2018-08-13 DIAGNOSIS — M25561 Pain in right knee: Secondary | ICD-10-CM | POA: Diagnosis not present

## 2018-08-13 DIAGNOSIS — R296 Repeated falls: Secondary | ICD-10-CM | POA: Diagnosis not present

## 2018-08-13 DIAGNOSIS — M6281 Muscle weakness (generalized): Secondary | ICD-10-CM | POA: Diagnosis not present

## 2018-08-13 DIAGNOSIS — R2681 Unsteadiness on feet: Secondary | ICD-10-CM | POA: Diagnosis not present

## 2018-08-13 DIAGNOSIS — N3946 Mixed incontinence: Secondary | ICD-10-CM | POA: Diagnosis not present

## 2018-08-13 DIAGNOSIS — R41841 Cognitive communication deficit: Secondary | ICD-10-CM | POA: Diagnosis not present

## 2018-08-18 DIAGNOSIS — R296 Repeated falls: Secondary | ICD-10-CM | POA: Diagnosis not present

## 2018-08-18 DIAGNOSIS — R2681 Unsteadiness on feet: Secondary | ICD-10-CM | POA: Diagnosis not present

## 2018-08-18 DIAGNOSIS — M25561 Pain in right knee: Secondary | ICD-10-CM | POA: Diagnosis not present

## 2018-08-18 DIAGNOSIS — M6281 Muscle weakness (generalized): Secondary | ICD-10-CM | POA: Diagnosis not present

## 2018-08-20 DIAGNOSIS — M6281 Muscle weakness (generalized): Secondary | ICD-10-CM | POA: Diagnosis not present

## 2018-08-20 DIAGNOSIS — M25561 Pain in right knee: Secondary | ICD-10-CM | POA: Diagnosis not present

## 2018-08-20 DIAGNOSIS — R2681 Unsteadiness on feet: Secondary | ICD-10-CM | POA: Diagnosis not present

## 2018-08-20 DIAGNOSIS — R296 Repeated falls: Secondary | ICD-10-CM | POA: Diagnosis not present

## 2018-08-23 DIAGNOSIS — M6281 Muscle weakness (generalized): Secondary | ICD-10-CM | POA: Diagnosis not present

## 2018-08-23 DIAGNOSIS — R2681 Unsteadiness on feet: Secondary | ICD-10-CM | POA: Diagnosis not present

## 2018-08-23 DIAGNOSIS — R296 Repeated falls: Secondary | ICD-10-CM | POA: Diagnosis not present

## 2018-08-23 DIAGNOSIS — M25561 Pain in right knee: Secondary | ICD-10-CM | POA: Diagnosis not present

## 2018-08-25 DIAGNOSIS — R2681 Unsteadiness on feet: Secondary | ICD-10-CM | POA: Diagnosis not present

## 2018-08-25 DIAGNOSIS — R296 Repeated falls: Secondary | ICD-10-CM | POA: Diagnosis not present

## 2018-08-25 DIAGNOSIS — M6281 Muscle weakness (generalized): Secondary | ICD-10-CM | POA: Diagnosis not present

## 2018-08-25 DIAGNOSIS — M25561 Pain in right knee: Secondary | ICD-10-CM | POA: Diagnosis not present

## 2018-08-27 DIAGNOSIS — M25561 Pain in right knee: Secondary | ICD-10-CM | POA: Diagnosis not present

## 2018-08-27 DIAGNOSIS — R2681 Unsteadiness on feet: Secondary | ICD-10-CM | POA: Diagnosis not present

## 2018-08-27 DIAGNOSIS — M6281 Muscle weakness (generalized): Secondary | ICD-10-CM | POA: Diagnosis not present

## 2018-08-27 DIAGNOSIS — R296 Repeated falls: Secondary | ICD-10-CM | POA: Diagnosis not present

## 2018-08-30 DIAGNOSIS — R296 Repeated falls: Secondary | ICD-10-CM | POA: Diagnosis not present

## 2018-08-30 DIAGNOSIS — R2681 Unsteadiness on feet: Secondary | ICD-10-CM | POA: Diagnosis not present

## 2018-08-30 DIAGNOSIS — M25561 Pain in right knee: Secondary | ICD-10-CM | POA: Diagnosis not present

## 2018-08-30 DIAGNOSIS — M6281 Muscle weakness (generalized): Secondary | ICD-10-CM | POA: Diagnosis not present

## 2018-09-01 DIAGNOSIS — R2681 Unsteadiness on feet: Secondary | ICD-10-CM | POA: Diagnosis not present

## 2018-09-01 DIAGNOSIS — M25561 Pain in right knee: Secondary | ICD-10-CM | POA: Diagnosis not present

## 2018-09-01 DIAGNOSIS — R296 Repeated falls: Secondary | ICD-10-CM | POA: Diagnosis not present

## 2018-09-01 DIAGNOSIS — M6281 Muscle weakness (generalized): Secondary | ICD-10-CM | POA: Diagnosis not present

## 2018-09-03 DIAGNOSIS — R296 Repeated falls: Secondary | ICD-10-CM | POA: Diagnosis not present

## 2018-09-03 DIAGNOSIS — M25561 Pain in right knee: Secondary | ICD-10-CM | POA: Diagnosis not present

## 2018-09-03 DIAGNOSIS — M6281 Muscle weakness (generalized): Secondary | ICD-10-CM | POA: Diagnosis not present

## 2018-09-03 DIAGNOSIS — R2681 Unsteadiness on feet: Secondary | ICD-10-CM | POA: Diagnosis not present

## 2018-09-06 DIAGNOSIS — M25561 Pain in right knee: Secondary | ICD-10-CM | POA: Diagnosis not present

## 2018-09-06 DIAGNOSIS — M6281 Muscle weakness (generalized): Secondary | ICD-10-CM | POA: Diagnosis not present

## 2018-09-06 DIAGNOSIS — R2681 Unsteadiness on feet: Secondary | ICD-10-CM | POA: Diagnosis not present

## 2018-09-06 DIAGNOSIS — R296 Repeated falls: Secondary | ICD-10-CM | POA: Diagnosis not present

## 2018-09-08 DIAGNOSIS — M6281 Muscle weakness (generalized): Secondary | ICD-10-CM | POA: Diagnosis not present

## 2018-09-08 DIAGNOSIS — R2681 Unsteadiness on feet: Secondary | ICD-10-CM | POA: Diagnosis not present

## 2018-09-08 DIAGNOSIS — R296 Repeated falls: Secondary | ICD-10-CM | POA: Diagnosis not present

## 2018-09-08 DIAGNOSIS — M25561 Pain in right knee: Secondary | ICD-10-CM | POA: Diagnosis not present

## 2018-09-10 DIAGNOSIS — M25561 Pain in right knee: Secondary | ICD-10-CM | POA: Diagnosis not present

## 2018-09-10 DIAGNOSIS — M6281 Muscle weakness (generalized): Secondary | ICD-10-CM | POA: Diagnosis not present

## 2018-09-10 DIAGNOSIS — R296 Repeated falls: Secondary | ICD-10-CM | POA: Diagnosis not present

## 2018-09-10 DIAGNOSIS — R2681 Unsteadiness on feet: Secondary | ICD-10-CM | POA: Diagnosis not present

## 2018-09-23 ENCOUNTER — Telehealth: Payer: Self-pay | Admitting: Nurse Practitioner

## 2018-09-23 ENCOUNTER — Ambulatory Visit (INDEPENDENT_AMBULATORY_CARE_PROVIDER_SITE_OTHER): Payer: Medicare Other | Admitting: Nurse Practitioner

## 2018-09-23 ENCOUNTER — Encounter: Payer: Self-pay | Admitting: Nurse Practitioner

## 2018-09-23 VITALS — BP 128/70 | HR 83 | Ht 63.0 in | Wt 194.0 lb

## 2018-09-23 DIAGNOSIS — J441 Chronic obstructive pulmonary disease with (acute) exacerbation: Secondary | ICD-10-CM | POA: Diagnosis not present

## 2018-09-23 DIAGNOSIS — G4733 Obstructive sleep apnea (adult) (pediatric): Secondary | ICD-10-CM | POA: Diagnosis not present

## 2018-09-23 MED ORDER — TIOTROPIUM BROMIDE MONOHYDRATE 2.5 MCG/ACT IN AERS: 2.0000 | INHALATION_SPRAY | Freq: Every day | RESPIRATORY_TRACT | 3 refills | Status: DC

## 2018-09-23 MED ORDER — GUAIFENESIN ER 600 MG PO TB12: 600.0000 mg | ORAL_TABLET | Freq: Two times a day (BID) | ORAL | 0 refills | Status: DC

## 2018-09-23 MED ORDER — AZITHROMYCIN 250 MG PO TABS: ORAL_TABLET | ORAL | 0 refills | Status: DC

## 2018-09-23 NOTE — Telephone Encounter (Signed)
[facility], unable to reach left message to give [place] a call back.

## 2018-09-23 NOTE — Progress Notes (Signed)
@Patient  ID: [name], female    DOB: Sep 25, 1928, 84 y.o.   MRN: [id number]  Chief Complaint  Patient presents with  . Shortness of Breath    Referring provider: Rankins, Bill Salinas, MD  HPI 84 year old female with history of COPD and sleep apnea followed by Dr. Vaughan Browner. Health history includes HTN, hyperlipidemia, CKD.   TESTS: PFTs 01/30/16 FVC 1.86 [87%), FEV1 1.33 [85%), F/F 71, TLC 90%, DLCO 59% Minimal obstructive disease, moderate reduction in DLCO which however corrects for alveolar volume.  Sleep study 01/16/16 Study did not show significant sleep-disordered breathing however the sleep time was pretty low. Study was performed on 2 L O2 and did not show any hypoxia.  Home sleep study 09/04/16 Severe OSA with desaturations to 81%. AHI 31.3,   BiPAP download June-July 2018 Days greater than 4 hours-100% AHI 0.5  OV - 09/24/18 - acute Shortness of breath Patient presents with complaint of increased shortness of breath with cough productive of yellow sputum. States that the symptoms started about 2 weeks ago. Symptoms seem to be progressively worsening. She has been compliant with Spiriva, but states that she feels like she is not getting an adequate breath in to get the full dose of the medication. She denies any fever, chest pain, or edema.     Allergies  Allergen Reactions  . Latex Itching  . Zetia [Ezetimibe] Other (See Comments)  . Adhesive [Tape] Rash     There is no immunization history on file for this patient.  History reviewed. No pertinent past medical history.  Tobacco History: Social History   Tobacco Use  Smoking Status Never Smoker  Smokeless Tobacco Never Used   Counseling given: Yes   Outpatient Encounter Medications as of 09/23/2018  Medication Sig  . acetaminophen (TYLENOL) 325 MG tablet Take 650 mg by mouth every 6 (six) hours as needed.  Marland Kitchen amLODipine (NORVASC) 10 MG tablet Take 5 mg by mouth daily.   Marland Kitchen aspirin 81 MG tablet  Take 81 mg by mouth daily.  . bumetanide (BUMEX) 1 MG tablet Take 1 mg by mouth daily.   . isosorbide mononitrate (IMDUR) 30 MG 24 hr tablet Take 30 mg by mouth daily.  Marland Kitchen latanoprost (XALATAN) 0.005 % ophthalmic solution 1 drop at bedtime.  . metoprolol succinate (TOPROL-XL) 50 MG 24 hr tablet Take 50 mg by mouth daily. Take with or immediately following a meal.  . Misc Natural Products (OSTEO BI-FLEX JOINT SHIELD PO) Take 1 tablet by mouth 2 (two) times daily.  . Multiple Vitamins-Minerals (CENTRUM SILVER ULTRA WOMENS PO) Take 1 tablet by mouth daily.  . potassium chloride SA (K-DUR,KLOR-CON) 20 MEQ tablet Take 20 mEq by mouth daily.   . Probiotic Product (PROBIOTIC COLON SUPPORT PO) Take by mouth.  . simvastatin (ZOCOR) 10 MG tablet Take 10 mg by mouth daily.  . Vitamin D, Ergocalciferol, (DRISDOL) 50000 units CAPS capsule Take 50,000 Units by mouth every 7 (seven) days.  . [DISCONTINUED] tiotropium (SPIRIVA) 18 MCG inhalation capsule Place 18 mcg into inhaler and inhale daily.  Marland Kitchen azithromycin (ZITHROMAX) 250 MG tablet May take 2 tablets (500 mg) on day 1, then take 1 tablet (250 mg) on days 2-5  . guaiFENesin (MUCINEX) 600 MG 12 hr tablet Take 1 tablet (600 mg total) by mouth 2 (two) times daily for 14 days.  . Tiotropium Bromide Monohydrate (SPIRIVA RESPIMAT) 2.5 MCG/ACT AERS Inhale 2 puffs into the lungs daily.   No facility-administered encounter medications on file as of  09/23/2018.      Review of Systems  Review of Systems  Constitutional: Negative.  Negative for chills and fever.  HENT: Positive for postnasal drip. Negative for congestion, sinus pressure and sinus pain.   Respiratory: Positive for cough and shortness of breath. Negative for wheezing.   Cardiovascular: Negative.  Negative for chest pain, palpitations and leg swelling.  Gastrointestinal: Negative.   Allergic/Immunologic: Negative.   Neurological: Negative.   Psychiatric/Behavioral: Negative.        Physical  Exam  BP 128/70 (BP Location: Right Arm, Patient Position: Sitting, Cuff Size: Large)   Pulse 83   Ht 5\' 3"  (1.6 m)   Wt 194 lb (88 kg)   SpO2 92%   BMI 34.37 kg/m   Wt Readings from Last 5 Encounters:  09/23/18 194 lb (88 kg)  12/29/17 197 lb (89.4 kg)  06/29/17 199 lb 12.8 oz (90.6 kg)  12/30/16 204 lb 9.6 oz (92.8 kg)  10/08/16 207 lb 12.8 oz (94.3 kg)     Physical Exam  Constitutional: She is oriented to person, place, and time. She appears well-developed and well-nourished. No distress.  Cardiovascular: Normal rate and regular rhythm.  Pulmonary/Chest: Effort normal and breath sounds normal. She has no rhonchi.  Musculoskeletal:       Left lower leg: She exhibits no edema.  Neurological: She is alert and oriented to person, place, and time.  Psychiatric: She has a normal mood and affect.  Nursing note and vitals reviewed.    Assessment & Plan:   COPD with acute exacerbation [facility]) Patient Instructions  Will order azithromycin Will Spiriva to Spiriva respimat Will order mucinex Continue all other medications Follow up with Dr. Vaughan Browner in 2-3 weeks        Fenton Foy, NP 09/24/2018

## 2018-09-23 NOTE — Patient Instructions (Addendum)
Will order azithromycin Will Spiriva to Spiriva respimat Will order mucinex Continue all other medications Follow up with Dr. Vaughan Browner in 2-3 weeks

## 2018-09-24 ENCOUNTER — Encounter: Payer: Self-pay | Admitting: Nurse Practitioner

## 2018-09-24 DIAGNOSIS — G4733 Obstructive sleep apnea (adult) (pediatric): Secondary | ICD-10-CM

## 2018-09-24 DIAGNOSIS — J441 Chronic obstructive pulmonary disease with (acute) exacerbation: Secondary | ICD-10-CM

## 2018-09-24 HISTORY — DX: Obstructive sleep apnea (adult) (pediatric): G47.33

## 2018-09-24 HISTORY — DX: Chronic obstructive pulmonary disease with (acute) exacerbation: J44.1

## 2018-09-24 NOTE — Assessment & Plan Note (Signed)
Patient Instructions  Will order azithromycin Will Spiriva to Spiriva respimat Will order mucinex Continue all other medications Follow up with Dr. Vaughan Browner in 2-3 weeks

## 2018-09-24 NOTE — Telephone Encounter (Signed)
Called [place] in an attempt to speak to [place] but unable to reach her.  Left message for Karena Addison to return call x2

## 2018-09-27 NOTE — Telephone Encounter (Signed)
Left message for [facility] at [facility].

## 2018-09-28 NOTE — Telephone Encounter (Signed)
[facility] and spoke with [place] in regards to pt's spiriva.  Per [place], the daughter had the actual paperwork that they were able to look at and get the clarification they were needing.  Nothing further needed.

## 2018-10-04 ENCOUNTER — Inpatient Hospital Stay (HOSPITAL_COMMUNITY)
Admission: EM | Admit: 2018-10-04 | Discharge: 2018-10-08 | DRG: 291 | Disposition: A | Discharge status: Home Health | Payer: Medicare Other | Attending: Internal Medicine | Admitting: Internal Medicine

## 2018-10-04 ENCOUNTER — Other Ambulatory Visit: Payer: Self-pay

## 2018-10-04 ENCOUNTER — Encounter (HOSPITAL_COMMUNITY): Payer: Self-pay

## 2018-10-04 ENCOUNTER — Emergency Department (HOSPITAL_COMMUNITY): Payer: Medicare Other

## 2018-10-04 DIAGNOSIS — Z9989 Dependence on other enabling machines and devices: Secondary | ICD-10-CM

## 2018-10-04 DIAGNOSIS — I10 Essential (primary) hypertension: Secondary | ICD-10-CM | POA: Diagnosis not present

## 2018-10-04 DIAGNOSIS — I130 Hypertensive heart and chronic kidney disease with heart failure and stage 1 through stage 4 chronic kidney disease, or unspecified chronic kidney disease: Secondary | ICD-10-CM | POA: Diagnosis not present

## 2018-10-04 DIAGNOSIS — J4489 Other specified chronic obstructive pulmonary disease: Secondary | ICD-10-CM

## 2018-10-04 DIAGNOSIS — E8770 Fluid overload, unspecified: Secondary | ICD-10-CM

## 2018-10-04 DIAGNOSIS — I509 Heart failure, unspecified: Secondary | ICD-10-CM

## 2018-10-04 DIAGNOSIS — J449 Chronic obstructive pulmonary disease, unspecified: Secondary | ICD-10-CM | POA: Diagnosis present

## 2018-10-04 DIAGNOSIS — I272 Pulmonary hypertension, unspecified: Secondary | ICD-10-CM | POA: Diagnosis present

## 2018-10-04 DIAGNOSIS — Z9089 Acquired absence of other organs: Secondary | ICD-10-CM

## 2018-10-04 DIAGNOSIS — R0600 Dyspnea, unspecified: Secondary | ICD-10-CM

## 2018-10-04 DIAGNOSIS — R0602 Shortness of breath: Secondary | ICD-10-CM

## 2018-10-04 DIAGNOSIS — D649 Anemia, unspecified: Secondary | ICD-10-CM

## 2018-10-04 DIAGNOSIS — I5033 Acute on chronic diastolic (congestive) heart failure: Secondary | ICD-10-CM | POA: Diagnosis present

## 2018-10-04 DIAGNOSIS — E785 Hyperlipidemia, unspecified: Secondary | ICD-10-CM | POA: Diagnosis present

## 2018-10-04 DIAGNOSIS — E669 Obesity, unspecified: Secondary | ICD-10-CM | POA: Diagnosis present

## 2018-10-04 DIAGNOSIS — E872 Acidosis: Secondary | ICD-10-CM | POA: Diagnosis not present

## 2018-10-04 DIAGNOSIS — R7989 Other specified abnormal findings of blood chemistry: Secondary | ICD-10-CM | POA: Diagnosis present

## 2018-10-04 DIAGNOSIS — M25561 Pain in right knee: Secondary | ICD-10-CM | POA: Diagnosis present

## 2018-10-04 DIAGNOSIS — Z888 Allergy status to other drugs, medicaments and biological substances status: Secondary | ICD-10-CM

## 2018-10-04 DIAGNOSIS — N185 Chronic kidney disease, stage 5: Secondary | ICD-10-CM

## 2018-10-04 DIAGNOSIS — I5031 Acute diastolic (congestive) heart failure: Secondary | ICD-10-CM

## 2018-10-04 DIAGNOSIS — K529 Noninfective gastroenteritis and colitis, unspecified: Secondary | ICD-10-CM | POA: Diagnosis present

## 2018-10-04 DIAGNOSIS — Z6834 Body mass index (BMI) 34.0-34.9, adult: Secondary | ICD-10-CM

## 2018-10-04 DIAGNOSIS — N184 Chronic kidney disease, stage 4 (severe): Secondary | ICD-10-CM | POA: Diagnosis not present

## 2018-10-04 DIAGNOSIS — Z7951 Long term (current) use of inhaled steroids: Secondary | ICD-10-CM

## 2018-10-04 DIAGNOSIS — Z91040 Latex allergy status: Secondary | ICD-10-CM

## 2018-10-04 DIAGNOSIS — Z91048 Other nonmedicinal substance allergy status: Secondary | ICD-10-CM

## 2018-10-04 DIAGNOSIS — I4581 Long QT syndrome: Secondary | ICD-10-CM | POA: Diagnosis present

## 2018-10-04 DIAGNOSIS — Z9181 History of falling: Secondary | ICD-10-CM

## 2018-10-04 DIAGNOSIS — Z66 Do not resuscitate: Secondary | ICD-10-CM | POA: Diagnosis present

## 2018-10-04 DIAGNOSIS — R296 Repeated falls: Secondary | ICD-10-CM | POA: Diagnosis present

## 2018-10-04 DIAGNOSIS — Z79899 Other long term (current) drug therapy: Secondary | ICD-10-CM

## 2018-10-04 DIAGNOSIS — Z7982 Long term (current) use of aspirin: Secondary | ICD-10-CM

## 2018-10-04 DIAGNOSIS — D631 Anemia in chronic kidney disease: Secondary | ICD-10-CM | POA: Diagnosis present

## 2018-10-04 DIAGNOSIS — I083 Combined rheumatic disorders of mitral, aortic and tricuspid valves: Secondary | ICD-10-CM | POA: Diagnosis present

## 2018-10-04 DIAGNOSIS — R52 Pain, unspecified: Secondary | ICD-10-CM

## 2018-10-04 DIAGNOSIS — G4733 Obstructive sleep apnea (adult) (pediatric): Secondary | ICD-10-CM | POA: Diagnosis present

## 2018-10-04 HISTORY — DX: Heart failure, unspecified: I50.9

## 2018-10-04 HISTORY — DX: Cardiac murmur, unspecified: R01.1

## 2018-10-04 HISTORY — DX: Chronic obstructive pulmonary disease, unspecified: J44.9

## 2018-10-04 HISTORY — DX: Other specified chronic obstructive pulmonary disease: J44.89

## 2018-10-04 HISTORY — DX: Obesity, unspecified: E66.9

## 2018-10-04 HISTORY — DX: Essential (primary) hypertension: I10

## 2018-10-04 LAB — BRAIN NATRIURETIC PEPTIDE: B Natriuretic Peptide: 1703.6 pg/mL — ABNORMAL HIGH (ref 0.0–100.0)

## 2018-10-04 LAB — BASIC METABOLIC PANEL
Anion gap: 12 (ref 5–15)
BUN: 74 mg/dL — ABNORMAL HIGH (ref 8–23)
CO2: 17 mmol/L — ABNORMAL LOW (ref 22–32)
Calcium: 9.3 mg/dL (ref 8.9–10.3)
Chloride: 113 mmol/L — ABNORMAL HIGH (ref 98–111)
Creatinine, Ser: 4.23 mg/dL — ABNORMAL HIGH (ref 0.44–1.00)
GFR calc Af Amer: 10 mL/min — ABNORMAL LOW (ref >60)
GFR calc non Af Amer: 9 mL/min — ABNORMAL LOW (ref >60)
Glucose, Bld: 103 mg/dL — ABNORMAL HIGH (ref 70–99)
Potassium: 4.9 mmol/L (ref 3.5–5.1)
Sodium: 142 mmol/L (ref 135–145)

## 2018-10-04 LAB — TROPONIN I
TROPONIN I: 0.03 ng/mL — AB (ref <0.03)
Troponin I: 0.04 ng/mL (ref <0.03)
Troponin I: 0.04 ng/mL (ref <0.03)

## 2018-10-04 LAB — CBC
HCT: 28 % — ABNORMAL LOW (ref 36.0–46.0)
Hemoglobin: 8.2 g/dL — ABNORMAL LOW (ref 12.0–15.0)
MCH: 27.8 pg (ref 26.0–34.0)
MCHC: 29.3 g/dL — ABNORMAL LOW (ref 30.0–36.0)
MCV: 94.9 fL (ref 80.0–100.0)
Platelets: 318 10*3/uL (ref 150–400)
RBC: 2.95 MIL/uL — ABNORMAL LOW (ref 3.87–5.11)
RDW: 17.5 % — ABNORMAL HIGH (ref 11.5–15.5)
WBC: 9.4 10*3/uL (ref 4.0–10.5)
nRBC: 0 % (ref 0.0–0.2)

## 2018-10-04 LAB — URINALYSIS, ROUTINE W REFLEX MICROSCOPIC
Bilirubin Urine: NEGATIVE
Glucose, UA: NEGATIVE mg/dL
Hgb urine dipstick: NEGATIVE
Ketones, ur: NEGATIVE mg/dL
Leukocytes, UA: NEGATIVE
Nitrite: NEGATIVE
Protein, ur: 30 mg/dL — AB
Specific Gravity, Urine: 1.011 (ref 1.005–1.030)
pH: 5 (ref 5.0–8.0)

## 2018-10-04 MED ORDER — HYDRALAZINE HCL 20 MG/ML IJ SOLN
5.0000 mg | INTRAMUSCULAR | Status: DC | PRN
  Administered 2018-10-04 – 2018-10-06 (×2): 5 mg via INTRAVENOUS
  Filled 2018-10-04 (×3): qty 1

## 2018-10-04 MED ORDER — FUROSEMIDE 10 MG/ML IJ SOLN
80.0000 mg | Freq: Once | INTRAMUSCULAR | Status: AC
  Administered 2018-10-04: 80 mg via INTRAVENOUS
  Filled 2018-10-04: qty 8

## 2018-10-04 MED ORDER — ONDANSETRON HCL 4 MG/2ML IJ SOLN: 4.0000 mg | Freq: Four times a day (QID) | INTRAMUSCULAR | Status: DC | PRN

## 2018-10-04 MED ORDER — ACETAMINOPHEN 325 MG PO TABS: 650.0000 mg | ORAL_TABLET | Freq: Four times a day (QID) | ORAL | Status: DC | PRN

## 2018-10-04 MED ORDER — ASPIRIN EC 81 MG PO TBEC
81.0000 mg | DELAYED_RELEASE_TABLET | Freq: Every day | ORAL | Status: DC
  Administered 2018-10-04 – 2018-10-08 (×5): 81 mg via ORAL
  Filled 2018-10-04 (×5): qty 1

## 2018-10-04 MED ORDER — UMECLIDINIUM BROMIDE 62.5 MCG/INH IN AEPB
2.0000 | INHALATION_SPRAY | Freq: Every day | RESPIRATORY_TRACT | Status: DC
  Administered 2018-10-06 – 2018-10-08 (×3): 2 via RESPIRATORY_TRACT
  Filled 2018-10-04: qty 7

## 2018-10-04 MED ORDER — AMLODIPINE BESYLATE 5 MG PO TABS
5.0000 mg | ORAL_TABLET | Freq: Every day | ORAL | Status: DC
  Administered 2018-10-04 – 2018-10-05 (×2): 5 mg via ORAL
  Filled 2018-10-04 (×2): qty 1

## 2018-10-04 MED ORDER — SODIUM CHLORIDE 0.9% FLUSH: 3.0000 mL | INTRAVENOUS | Status: DC | PRN

## 2018-10-04 MED ORDER — HEPARIN SODIUM (PORCINE) 5000 UNIT/ML IJ SOLN
5000.0000 [IU] | Freq: Three times a day (TID) | INTRAMUSCULAR | Status: DC
  Administered 2018-10-04 – 2018-10-08 (×12): 5000 [IU] via SUBCUTANEOUS
  Filled 2018-10-04 (×12): qty 1

## 2018-10-04 MED ORDER — SIMVASTATIN 20 MG PO TABS
10.0000 mg | ORAL_TABLET | Freq: Every day | ORAL | Status: DC
  Administered 2018-10-05 – 2018-10-08 (×4): 10 mg via ORAL
  Filled 2018-10-04 (×4): qty 1

## 2018-10-04 MED ORDER — SODIUM CHLORIDE 0.9% FLUSH
3.0000 mL | Freq: Two times a day (BID) | INTRAVENOUS | Status: DC
  Administered 2018-10-04 – 2018-10-08 (×9): 3 mL via INTRAVENOUS

## 2018-10-04 MED ORDER — POTASSIUM CHLORIDE CRYS ER 20 MEQ PO TBCR
40.0000 meq | EXTENDED_RELEASE_TABLET | Freq: Once | ORAL | Status: AC
  Administered 2018-10-04: 40 meq via ORAL
  Filled 2018-10-04: qty 2

## 2018-10-04 MED ORDER — LATANOPROST 0.005 % OP SOLN
1.0000 [drp] | Freq: Every day | OPHTHALMIC | Status: DC
  Administered 2018-10-04 – 2018-10-07 (×4): 1 [drp] via OPHTHALMIC
  Filled 2018-10-04: qty 2.5

## 2018-10-04 MED ORDER — SODIUM CHLORIDE 0.9 % IV SOLN: 250.0000 mL | INTRAVENOUS | Status: DC | PRN

## 2018-10-04 MED ORDER — ISOSORBIDE MONONITRATE ER 30 MG PO TB24
30.0000 mg | ORAL_TABLET | Freq: Every day | ORAL | Status: DC
  Administered 2018-10-05 – 2018-10-08 (×4): 30 mg via ORAL
  Filled 2018-10-04 (×4): qty 1

## 2018-10-04 MED ORDER — FUROSEMIDE 10 MG/ML IJ SOLN
60.0000 mg | Freq: Two times a day (BID) | INTRAMUSCULAR | Status: DC
  Administered 2018-10-04 – 2018-10-05 (×2): 60 mg via INTRAVENOUS
  Filled 2018-10-04 (×2): qty 6

## 2018-10-04 MED ORDER — METOPROLOL SUCCINATE ER 50 MG PO TB24
50.0000 mg | ORAL_TABLET | Freq: Every day | ORAL | Status: DC
  Administered 2018-10-05 – 2018-10-08 (×4): 50 mg via ORAL
  Filled 2018-10-04 (×4): qty 1

## 2018-10-04 NOTE — ED Notes (Addendum)
Unable to obtain urine sample due to patients inability to urinate at this time.

## 2018-10-04 NOTE — ED Notes (Signed)
Date and time results received: 10/04/18 13:54  Test: Troponin  Critical Value: 0.04  Name of Provider Notified: Dr. Eugenio Hoes   Orders Received? Or Actions Taken?: Will continue to monitor and await new oders.

## 2018-10-04 NOTE — ED Provider Notes (Signed)
[facility] DEPT Provider Note   CSN: [id number] Arrival date & time: 10/04/18  1110     History   Chief Complaint Chief Complaint  Patient presents with  . Foot Swelling  . Shortness of Breath  . Weakness    HPI Kathryn Peck is a 84 y.o. female.  HPI   84 year old female with dyspnea.  She reports it began in the past week or so but seen by pulmonology in office on 09/23/18 and was complaining about it then too.   She was worried azithromycin and Mucinex.  She reports progression of symptoms despite this.  Occasional cough.  Denies any acute pain.  Progressive peripheral edema.  She reports a past history of COPD although she has never been a smoker.  Per pulmonology notes, old mild obstructive disease on pulmonary function testing. She was previously on supplemental o2 but then felt like she no longer required it. She is a has a past history of congestive heart failure.  Specifics not clear per review of records.  She reports that she has been seen by Dr. Wynonia Lawman locally. Prior to this she lived in [place].   Past Medical History:  Diagnosis Date  . CHF (congestive heart failure) ([place])   . Heart murmur     Patient Active Problem List   Diagnosis Date Noted  . OSA (obstructive sleep apnea) 09/24/2018  . COPD with acute exacerbation ([place]) 09/24/2018    Past Surgical History:  Procedure Laterality Date  . EYE SURGERY    . TONSILLECTOMY       OB History   None      Home Medications    Prior to Admission medications   Medication Sig Start Date End Date Taking? Authorizing Provider  acetaminophen (TYLENOL) 325 MG tablet Take 650 mg by mouth every 6 (six) hours as needed.    [provider]  amLODipine (NORVASC) 10 MG tablet Take 5 mg by mouth daily.     [provider]  aspirin 81 MG tablet Take 81 mg by mouth daily.    [provider]  azithromycin (ZITHROMAX) 250 MG tablet May take 2 tablets (500 mg)  on day 1, then take 1 tablet (250 mg) on days 2-5 09/23/18   Fenton Foy, NP  bumetanide (BUMEX) 1 MG tablet Take 1 mg by mouth daily.     [provider]  guaiFENesin (MUCINEX) 600 MG 12 hr tablet Take 1 tablet (600 mg total) by mouth 2 (two) times daily for 14 days. 09/23/18 10/07/18  Fenton Foy, NP  isosorbide mononitrate (IMDUR) 30 MG 24 hr tablet Take 30 mg by mouth daily.    [provider]  latanoprost (XALATAN) 0.005 % ophthalmic solution 1 drop at bedtime.    [provider]  metoprolol succinate (TOPROL-XL) 50 MG 24 hr tablet Take 50 mg by mouth daily. Take with or immediately following a meal.    [provider]  Misc Natural Products (OSTEO BI-FLEX JOINT SHIELD PO) Take 1 tablet by mouth 2 (two) times daily.    [provider]  Multiple Vitamins-Minerals (CENTRUM SILVER ULTRA WOMENS PO) Take 1 tablet by mouth daily.    [provider]  potassium chloride SA (K-DUR,KLOR-CON) 20 MEQ tablet Take 20 mEq by mouth daily.     [provider]  Probiotic Product (PROBIOTIC COLON SUPPORT PO) Take by mouth.    [provider]  simvastatin (ZOCOR) 10 MG tablet Take 10 mg by mouth  daily.    [provider]  Tiotropium Bromide Monohydrate (SPIRIVA RESPIMAT) 2.5 MCG/ACT AERS Inhale 2 puffs into the lungs daily. 09/23/18 10/23/18  Fenton Foy, NP  Vitamin D, Ergocalciferol, (DRISDOL) 50000 units CAPS capsule Take 50,000 Units by mouth every 7 (seven) days.    [provider]    Family History History reviewed. No pertinent family history.  Social History Social History   Tobacco Use  . Smoking status: Never Smoker  . Smokeless tobacco: Never Used  Substance Use Topics  . Alcohol use: Never    Alcohol/week: 0.0 standard drinks    Frequency: Never  . Drug use: Never     Allergies   Latex; Zetia [ezetimibe]; and Adhesive [tape]   Review of Systems Review of Systems  All systems  reviewed and negative, other than as noted in HPI.    Physical Exam Updated Vital Signs BP (!) 148/69   Pulse 62   Temp (!) 97.5 F (36.4 C) (Oral)   Resp 18   Ht 5\' 3"  (1.6 m)   Wt 87.5 kg   SpO2 91%   BMI 34.19 kg/m    Physical Exam  Constitutional: She appears well-developed. No distress.  HENT:  Head: Normocephalic and atraumatic.  Eyes: Conjunctivae are normal. Right eye exhibits no discharge. Left eye exhibits no discharge.  Neck: Neck supple.  Cardiovascular: Normal rate and regular rhythm. Exam reveals no gallop and no friction rub.  Murmur heard. Pulmonary/Chest: Effort normal and breath sounds normal. No respiratory distress.  Tachypnea.  Mild expiratory wheezing.  Decreased breath sounds bilaterally otherwise.  Abdominal: Soft. She exhibits no distension. There is no tenderness.  Musculoskeletal: She exhibits no tenderness.       Right lower leg: She exhibits edema.  Severe symmetric pitting lower extremity edema.  Neurological: She is alert.  Skin: Skin is warm and dry.  Psychiatric: She has a normal mood and affect. Her behavior is normal. Thought content normal.  Nursing note and vitals reviewed.    ED Treatments / Results  Labs (all labs ordered are listed, but only abnormal results are displayed) Labs Reviewed  BASIC METABOLIC PANEL - Abnormal; Notable for the following components:      Result Value   Chloride 113 (*)    CO2 17 (*)    Glucose, Bld 103 (*)    BUN 74 (*)    Creatinine, Ser 4.23 (*)    GFR calc non Af Amer 9 (*)    GFR calc Af Amer 10 (*)    All other components within normal limits  CBC - Abnormal; Notable for the following components:   RBC 2.95 (*)    Hemoglobin 8.2 (*)    HCT 28.0 (*)    MCHC 29.3 (*)    RDW 17.5 (*)    All other components within normal limits  BRAIN NATRIURETIC PEPTIDE - Abnormal; Notable for the following components:   B Natriuretic Peptide 1,703.6 (*)    All other components within normal limits    TROPONIN I - Abnormal; Notable for the following components:   Troponin I 0.04 (*)    All other components within normal limits  URINALYSIS, ROUTINE W REFLEX MICROSCOPIC    EKG EKG Interpretation  Date/Time:  Monday October 04 2018 12:32:23 EDT Ventricular Rate:  67 PR Interval:    QRS Duration: 98 QT Interval:  466 QTC Calculation: 492 R Axis:   78 Text Interpretation:  Sinus rhythm Borderline repolarization abnormality Borderline prolonged QT interval  No old tracing to compare Confirmed by Virgel Manifold ([id number]) on 10/04/2018 12:54:45 PM   Radiology Dg Chest 2 View  Result Date: 10/04/2018 CLINICAL DATA:  Shortness of breath. EXAM: CHEST - 2 VIEW COMPARISON:  None. FINDINGS: Borderline cardiomegaly considering the AP technique. Aortic atherosclerosis. The main pulmonary artery is prominent. Dense calcification in the mitral valve annulus. No infiltrates or effusions. No acute bone abnormality. IMPRESSION: Borderline heart size.  Prominent main pulmonary artery. Aortic Atherosclerosis (ICD10-I70.0). Electronically Signed   By: Lorriane Shire M.D.   On: 10/04/2018 12:48    Procedures Procedures (including critical care time)  Medications Ordered in ED Medications - No data to display   Initial Impression / Assessment and Plan / ED Course  I have reviewed the triage vital signs and the nursing notes.  Pertinent labs & imaging results that were available during my care of the patient were reviewed by me and considered in my medical decision making (see chart for details).     84 year old female with progressive dyspnea.  Some wheezing on exam.  She is also clinically very volume overloaded.  Listed past history of CHF.  Local care previously by Dr. Wynonia Lawman.  I do not see any prior echocardiograms in our system.  Systolic murmur on exam which sounds like AS. She reports she has been previoulsy told she has a murmur. Her EKG is also abnormal but I do not have any priors to  compare to she denies any chest pain. Troponin is minimally elevated. Doesn't appear to have known hx of CHF. I'm not sure of the significance of this, if any, particlualry with advanced CKD (stage 4 mentioned in pulmonology note) at baseline. Anemic, but again baseline unclear. May be contributing but suspect chronic to some degree with her kidney disease and there also may be a dilutional component. Trend.    Final Clinical Impressions(s) / ED Diagnoses   Final diagnoses:  Dyspnea, unspecified type  Acute on chronic congestive heart failure, unspecified heart failure type (HCC)  Anemia, unspecified type  Stage 4 chronic kidney disease (HCC)  Hypervolemia, unspecified hypervolemia type    ED Discharge Orders    None      Virgel Manifold, MD 10/04/18 1418

## 2018-10-04 NOTE — ED Triage Notes (Signed)
Patient has had an increase of SOB, weakness, and feet swelling x 4 days. Patient saw her PCP 4 days ago and was given antibiotics at that time.

## 2018-10-04 NOTE — ED Notes (Signed)
Attempted IV. Unsuccessful. Unable to thread IV catheter. Blood work obtained

## 2018-10-04 NOTE — H&P (Signed)
History and Physical    Kathryn Peck LZJ:[id number] DOB: 04-02-1928 DOA: 10/04/2018  I have briefly reviewed the patient's prior medical records in [facility]  PCP: Rankins, Bill Salinas, MD  Patient coming from: assisted living  Chief Complaint: Shortness of breath and lower extremity swelling  HPI: Kathryn Peck is a 84 y.o. female with medical history significant of CHF, hypertension, obesity, OSA, chronic kidney disease stage IV, who presents to the hospital with chief complaint of worsening shortness of breath as well as worsening bilateral lower extremity swelling over the last couple weeks.  She states that when she saw her pulmonologist about 10 days ago her weight was 193 pounds, prior to that at [facility] where she resides she was at 181.  She does not weigh herself daily.  She reports compliance with a low-salt diet as much as she can but she eats at her facility.  She denies any chest pain, denies any abdominal pain, nausea vomiting.  She reports intermittent diarrhea which is chronic for her.  She denies any dysuria.  She reports good urine output on home Bumex daily.  ED Course: In the ED her vital signs are stable, she is a bit hypertensive, chest x-ray with slight cardiomegaly and prominent pulmonary artery.  Blood work reveals a creatinine of 4.2, BNP is 1700, troponin 0 0.04.  CBC pertinent for mild anemia with a hemoglobin of 8.2.  Given lower extremity swelling and weight gain she was given Lasix and we are asked to admit.  Review of Systems: As per HPI otherwise 10 point review of systems negative.   Past Medical History:  Diagnosis Date  . CHF (congestive heart failure) ([place])   . Heart murmur     Past Surgical History:  Procedure Laterality Date  . EYE SURGERY    . TONSILLECTOMY       reports that she has never smoked. She has never used smokeless tobacco. She reports that she does not drink alcohol or use drugs.  Allergies  Allergen Reactions    . Latex Itching  . Adhesive [Tape] Rash  . Influenza Vaccines Nausea And Vomiting    Vaccine made her "deathly sick".  She does not take them at all  . Zetia [Ezetimibe] Other (See Comments)    Unknown    History reviewed. No pertinent family history.  Prior to Admission medications   Medication Sig Start Date End Date Taking? Authorizing Provider  acetaminophen (TYLENOL) 325 MG tablet Take 650 mg by mouth every 6 (six) hours as needed.    [provider]  amLODipine (NORVASC) 5 MG tablet Take 5 mg by mouth daily. 08/14/18   [provider]  aspirin 81 MG tablet Take 81 mg by mouth daily.    [provider]  azithromycin (ZITHROMAX) 250 MG tablet May take 2 tablets (500 mg) on day 1, then take 1 tablet (250 mg) on days 2-5 09/23/18   Fenton Foy, NP  bumetanide (BUMEX) 1 MG tablet Take 1 mg by mouth daily.     [provider]  guaiFENesin (MUCINEX) 600 MG 12 hr tablet Take 1 tablet (600 mg total) by mouth 2 (two) times daily for 14 days. 09/23/18 10/07/18  Fenton Foy, NP  isosorbide mononitrate (IMDUR) 30 MG 24 hr tablet Take 30 mg by mouth daily.    [provider]  latanoprost (XALATAN) 0.005 % ophthalmic solution 1 drop at bedtime.    [provider]  metoprolol succinate (TOPROL-XL) 50 MG  24 hr tablet Take 50 mg by mouth daily. Take with or immediately following a meal.    [provider]  Misc Natural Products (OSTEO BI-FLEX JOINT SHIELD PO) Take 1 tablet by mouth 2 (two) times daily.    [provider]  Multiple Vitamins-Minerals (CENTRUM SILVER ULTRA WOMENS PO) Take 1 tablet by mouth daily.    [provider]  potassium chloride SA (K-DUR,KLOR-CON) 20 MEQ tablet Take 20 mEq by mouth daily.     [provider]  Probiotic Product (PROBIOTIC COLON SUPPORT PO) Take by mouth.    [provider]  simvastatin (ZOCOR) 10 MG tablet Take 10 mg by mouth daily.    [provider]  Tiotropium Bromide Monohydrate (SPIRIVA RESPIMAT) 2.5 MCG/ACT AERS Inhale 2 puffs into the lungs daily. 09/23/18 10/23/18  Fenton Foy, NP  Vitamin D, Ergocalciferol, (DRISDOL) 50000 units CAPS capsule Take 50,000 Units by mouth every 7 (seven) days.    [provider]    Physical Exam: Vitals:   10/04/18 1115 10/04/18 1119 10/04/18 1336  BP: (!) 148/69  (!) 186/68  Pulse: 62  66  Resp: 18  19  Temp: (!) 97.5 F (36.4 C)    TempSrc: Oral    SpO2: 91%  94%  Weight:  87.5 kg   Height:  5\' 3"  (1.6 m)       Constitutional: NAD, calm, comfortable Eyes: PERRL, lids and conjunctivae normal ENMT: Mucous membranes are moist. Posterior pharynx clear of any exudate or lesions.Normal dentition.  Neck: normal, supple, no masses, no thyromegaly Respiratory: Overall diminished breath sounds due to obesity but no wheezing or significant crackles appreciated.  Slight tachypnea noted Cardiovascular: Regular rate and rhythm, 3/6 SEM.  2+ lower extremity edema. 2+ pedal pulses.  Abdomen: no tenderness, no masses palpated. Bowel sounds positive.  Musculoskeletal: no clubbing / cyanosis. Normal muscle tone.  Skin: no rashes, lesions, ulcers. No induration Neurologic: CN 2-12 grossly intact. Strength 5/5 in all 4.  Psychiatric: Normal judgment and insight. Alert and oriented x 3. Normal mood.   Labs on Admission: I have personally reviewed following labs and imaging studies  CBC: Recent Labs  Lab 10/04/18 1256  WBC 9.4  HGB 8.2*  HCT 28.0*  MCV 94.9  PLT 409   Basic Metabolic Panel: Recent Labs  Lab 10/04/18 1256  NA 142  K 4.9  CL 113*  CO2 17*  GLUCOSE 103*  BUN 74*  CREATININE 4.23*  CALCIUM 9.3   GFR: Estimated Creatinine Clearance: 9.6 mL/min (A) (by C-G formula based on SCr of 4.23 mg/dL (H)). Liver Function Tests: No results for input(s): AST, ALT, ALKPHOS, BILITOT, PROT, ALBUMIN in the last 168 hours. No results for input(s): LIPASE, AMYLASE in the last  168 hours. No results for input(s): AMMONIA in the last 168 hours. Coagulation Profile: No results for input(s): INR, PROTIME in the last 168 hours. Cardiac Enzymes: Recent Labs  Lab 10/04/18 1256  TROPONINI 0.04*   BNP (last 3 results) No results for input(s): PROBNP in the last 8760 hours. HbA1C: No results for input(s): HGBA1C in the last 72 hours. CBG: No results for input(s): GLUCAP in the last 168 hours. Lipid Profile: No results for input(s): CHOL, HDL, LDLCALC, TRIG, CHOLHDL, LDLDIRECT in the last 72 hours. Thyroid Function Tests: No results for input(s): TSH, T4TOTAL, FREET4, T3FREE, THYROIDAB in the last 72 hours. Anemia Panel: No results for input(s): VITAMINB12, FOLATE, FERRITIN, TIBC, IRON, RETICCTPCT in the last 72 hours. Urine analysis:  No results found for: COLORURINE, APPEARANCEUR, LABSPEC, [place], GLUCOSEU, [place], BILIRUBINUR, KETONESUR, PROTEINUR, UROBILINOGEN, NITRITE, LEUKOCYTESUR   Radiological Exams on Admission: Dg Chest 2 View  Result Date: 10/04/2018 CLINICAL DATA:  Shortness of breath. EXAM: CHEST - 2 VIEW COMPARISON:  None. FINDINGS: Borderline cardiomegaly considering the AP technique. Aortic atherosclerosis. The main pulmonary artery is prominent. Dense calcification in the mitral valve annulus. No infiltrates or effusions. No acute bone abnormality. IMPRESSION: Borderline heart size.  Prominent main pulmonary artery. Aortic Atherosclerosis (ICD10-I70.0). Electronically Signed   By: Lorriane Shire M.D.   On: 10/04/2018 12:48    EKG: Independently reviewed.  Sinus rhythm, nonspecific ST changes in V6  Assessment/Plan Principal Problem:   CHF exacerbation (HCC) Active Problems:   OSA (obstructive sleep apnea)   CKD (chronic kidney disease) stage 4, GFR 15-29 ml/min (HCC)   Essential hypertension   COPD with chronic bronchitis (HCC)   Obesity (BMI 30-39.9)    Acute CHF, unknown type -Most recent 2D echo was in June however I do not have  access to the results.  Admit to telemetry, provide IV Lasix, strict I's and O's, daily weights, will updated 2D echo  Chronic kidney disease stage IV -Seeing Dr. Posey Pronto with [facility], daughter thinks her creatinine is around 4, currently it appears at baseline -Monitor while on IV Lasix  Hypertension -Resume home medications with Norvasc, Imdur, metoprolol -Hydralazine as needed as she is persistently hypertensive  OSA -Nightly CPAP, daughter will bring CPAP machine from home  Hyperlipidemia -Continue statin  COPD -No wheezing, continue home medications    DVT prophylaxis: Heparin Code Status: DNR Family Communication: Daughter at bedside Disposition Plan: Admit to telemetry, back to [facility] when ready Consults called: None    Admission status: Observation  At the point of initial evaluation, it is my clinical opinion that admission for OBSERVATION is reasonable and necessary because the patient's presenting complaints in the context of their chronic conditions represent sufficient risk of deterioration or significant morbidity to constitute reasonable grounds for close observation in the hospital setting, but that the patient may be medically stable for discharge from the hospital within 24 to 48 hours.    Marzetta Board, MD [facility] Pager [phone]  If 7PM-7AM, please contact night-coverage [email or web address] Password [facility]  10/04/2018, 2:43 PM

## 2018-10-04 NOTE — Plan of Care (Signed)
  Problem: Elimination: Goal: Will not experience complications related to bowel motility Outcome: Progressing   Problem: Safety: Goal: Ability to remain free from injury will improve Outcome: Progressing   Problem: Skin Integrity: Goal: Risk for impaired skin integrity will decrease Outcome: Progressing   

## 2018-10-04 NOTE — ED Notes (Signed)
ED TO INPATIENT HANDOFF REPORT  Name/Age/Gender Kathryn Peck 84 y.o. female  Code Status Advance Directive Documentation     Most Recent Value  Type of Advance Directive  Healthcare Power of Attorney, Living will  Pre-existing out of facility DNR order (yellow form or pink MOST form)  -  "MOST" Form in Place?  -      Home/SNF/Other Home  Chief Complaint WEAK / SOB   Level of Care/Admitting Diagnosis ED Disposition    ED Disposition Condition [place]: [place] [[id number]]  Level of Care: Telemetry [5]  Admit to tele based on following criteria: Acute CHF  Diagnosis: CHF exacerbation [facility] [[id number]]  Admitting Physician: Caren Griffins [phone]  Attending Physician: Caren Griffins [[id number]]  PT Class (Do Not Modify): Observation [104]  PT Acc Code (Do Not Modify): Observation [10022]       Medical History Past Medical History:  Diagnosis Date  . CHF (congestive heart failure) ([place])   . Heart murmur     Allergies Allergies  Allergen Reactions  . Latex Itching  . Adhesive [Tape] Rash  . Influenza Vaccines Nausea And Vomiting    Vaccine made her "deathly sick".  She does not take them at all  . Zetia [Ezetimibe] Other (See Comments)    Unknown    IV Location/Drains/Wounds Patient Lines/Drains/Airways Status   Active Line/Drains/Airways    Name:   Placement date:   Placement time:   Site:   Days:   Peripheral IV 10/04/18 Right Antecubital   10/04/18    1511    Antecubital   less than 1   External Urinary Catheter   10/04/18    1416    -   less than 1          Labs/Imaging Results for orders placed or performed during the hospital encounter of 10/04/18 (from the past 48 hour(s))  Basic metabolic panel     Status: Abnormal   Collection Time: 10/04/18 12:56 PM  Result Value Ref Range   Sodium 142 135 - 145 mmol/L   Potassium 4.9 3.5 - 5.1 mmol/L   Chloride 113 (H) 98 - 111 mmol/L   CO2 17 (L) 22 - 32 mmol/L    Glucose, Bld 103 (H) 70 - 99 mg/dL   BUN 74 (H) 8 - 23 mg/dL   Creatinine, Ser 4.23 (H) 0.44 - 1.00 mg/dL   Calcium 9.3 8.9 - 10.3 mg/dL   GFR calc non Af Amer 9 (L) >60 mL/min   GFR calc Af Amer 10 (L) >60 mL/min    Comment: (NOTE) The eGFR has been calculated using the CKD EPI equation. This calculation has not been validated in all clinical situations. eGFR's persistently <60 mL/min signify possible Chronic Kidney Disease.    Anion gap 12 5 - 15    Comment: Performed at [facility], [place] [address] [place], [place] [address]  CBC     Status: Abnormal   Collection Time: 10/04/18 12:56 PM  Result Value Ref Range   WBC 9.4 4.0 - 10.5 K/uL   RBC 2.95 (L) 3.87 - 5.11 MIL/uL   Hemoglobin 8.2 (L) 12.0 - 15.0 g/dL   HCT 28.0 (L) 36.0 - 46.0 %   MCV 94.9 80.0 - 100.0 fL   MCH 27.8 26.0 - 34.0 pg   MCHC 29.3 (L) 30.0 - 36.0 g/dL   RDW 17.5 (H) 11.5 - 15.5 %   Platelets 318 150 -  400 K/uL   nRBC 0.0 0.0 - 0.2 %    Comment: Performed at [facility], [place] [address], [place], [place] [address]  Brain natriuretic peptide     Status: Abnormal   Collection Time: 10/04/18 12:56 PM  Result Value Ref Range   B Natriuretic Peptide 1,703.6 (H) 0.0 - 100.0 pg/mL    Comment: Performed at [facility], [place] [address] [place], [place] [address]  Troponin I     Status: Abnormal   Collection Time: 10/04/18 12:56 PM  Result Value Ref Range   Troponin I 0.04 (HH) <0.03 ng/mL    Comment: CRITICAL RESULT CALLED TO, READ BACK BY AND VERIFIED WITHMilas Gain RN AT 2800 10/04/18 MULLINS,T Performed at [facility], [place] [address], [place], [place] [address]    Dg Chest 2 View  Result Date: 10/04/2018 CLINICAL DATA:  Shortness of breath. EXAM: CHEST - 2 VIEW COMPARISON:  None. FINDINGS: Borderline cardiomegaly considering the AP technique. Aortic atherosclerosis. The main pulmonary artery is prominent. Dense calcification in  the mitral valve annulus. No infiltrates or effusions. No acute bone abnormality. IMPRESSION: Borderline heart size.  Prominent main pulmonary artery. Aortic Atherosclerosis (ICD10-I70.0). Electronically Signed   By: Lorriane Shire M.D.   On: 10/04/2018 12:48    Pending Labs Unresulted Labs (From admission, onward)    Start     Ordered   10/04/18 1238  Urinalysis, Routine w reflex microscopic  Once,   R     10/04/18 1238   Signed and Held  Basic metabolic panel  Daily,   R     Signed and Held   Signed and Held  Troponin I (q 6hr x 3)  Now then every 6 hours,   R     Signed and Held          Vitals/Pain Today's Vitals   10/04/18 1119 10/04/18 1336 10/04/18 1400 10/04/18 1500  BP:  (!) 186/68 (!) 183/70 (!) 183/77  Pulse:  66 69 70  Resp:  '19 18 18  '$ Temp:      TempSrc:      SpO2:  94% 94% 95%  Weight: 87.5 kg     Height: '5\' 3"'$  (1.6 m)     PainSc: 0-No pain       Isolation Precautions No active isolations  Medications Medications  furosemide (LASIX) injection 80 mg (80 mg Intravenous Given 10/04/18 1512)  potassium chloride SA (K-DUR,KLOR-CON) CR tablet 40 mEq (40 mEq Oral Given 10/04/18 1445)    Mobility walks with person assist

## 2018-10-05 ENCOUNTER — Encounter (HOSPITAL_COMMUNITY): Payer: Self-pay

## 2018-10-05 ENCOUNTER — Observation Stay (HOSPITAL_BASED_OUTPATIENT_CLINIC_OR_DEPARTMENT_OTHER): Payer: Medicare Other

## 2018-10-05 DIAGNOSIS — E8770 Fluid overload, unspecified: Secondary | ICD-10-CM | POA: Diagnosis not present

## 2018-10-05 DIAGNOSIS — I10 Essential (primary) hypertension: Secondary | ICD-10-CM | POA: Diagnosis not present

## 2018-10-05 DIAGNOSIS — I083 Combined rheumatic disorders of mitral, aortic and tricuspid valves: Secondary | ICD-10-CM | POA: Diagnosis present

## 2018-10-05 DIAGNOSIS — R0600 Dyspnea, unspecified: Secondary | ICD-10-CM | POA: Diagnosis not present

## 2018-10-05 DIAGNOSIS — I5031 Acute diastolic (congestive) heart failure: Secondary | ICD-10-CM

## 2018-10-05 DIAGNOSIS — R7989 Other specified abnormal findings of blood chemistry: Secondary | ICD-10-CM | POA: Diagnosis present

## 2018-10-05 DIAGNOSIS — I5021 Acute systolic (congestive) heart failure: Secondary | ICD-10-CM | POA: Diagnosis not present

## 2018-10-05 DIAGNOSIS — Z6834 Body mass index (BMI) 34.0-34.9, adult: Secondary | ICD-10-CM | POA: Diagnosis not present

## 2018-10-05 DIAGNOSIS — I5033 Acute on chronic diastolic (congestive) heart failure: Secondary | ICD-10-CM | POA: Diagnosis present

## 2018-10-05 DIAGNOSIS — Z862 Personal history of diseases of the blood and blood-forming organs and certain disorders involving the immune mechanism: Secondary | ICD-10-CM | POA: Diagnosis not present

## 2018-10-05 DIAGNOSIS — J449 Chronic obstructive pulmonary disease, unspecified: Secondary | ICD-10-CM | POA: Diagnosis present

## 2018-10-05 DIAGNOSIS — R296 Repeated falls: Secondary | ICD-10-CM | POA: Diagnosis present

## 2018-10-05 DIAGNOSIS — N189 Chronic kidney disease, unspecified: Secondary | ICD-10-CM | POA: Diagnosis not present

## 2018-10-05 DIAGNOSIS — I342 Nonrheumatic mitral (valve) stenosis: Secondary | ICD-10-CM

## 2018-10-05 DIAGNOSIS — N185 Chronic kidney disease, stage 5: Secondary | ICD-10-CM

## 2018-10-05 DIAGNOSIS — Z9181 History of falling: Secondary | ICD-10-CM | POA: Diagnosis not present

## 2018-10-05 DIAGNOSIS — E785 Hyperlipidemia, unspecified: Secondary | ICD-10-CM | POA: Diagnosis present

## 2018-10-05 DIAGNOSIS — I340 Nonrheumatic mitral (valve) insufficiency: Secondary | ICD-10-CM | POA: Diagnosis not present

## 2018-10-05 DIAGNOSIS — Z7951 Long term (current) use of inhaled steroids: Secondary | ICD-10-CM | POA: Diagnosis not present

## 2018-10-05 DIAGNOSIS — R918 Other nonspecific abnormal finding of lung field: Secondary | ICD-10-CM | POA: Diagnosis not present

## 2018-10-05 DIAGNOSIS — E872 Acidosis: Secondary | ICD-10-CM | POA: Diagnosis present

## 2018-10-05 DIAGNOSIS — M25561 Pain in right knee: Secondary | ICD-10-CM | POA: Diagnosis present

## 2018-10-05 DIAGNOSIS — Z7982 Long term (current) use of aspirin: Secondary | ICD-10-CM | POA: Diagnosis not present

## 2018-10-05 DIAGNOSIS — G4733 Obstructive sleep apnea (adult) (pediatric): Secondary | ICD-10-CM | POA: Diagnosis present

## 2018-10-05 DIAGNOSIS — I130 Hypertensive heart and chronic kidney disease with heart failure and stage 1 through stage 4 chronic kidney disease, or unspecified chronic kidney disease: Secondary | ICD-10-CM | POA: Diagnosis present

## 2018-10-05 DIAGNOSIS — Z66 Do not resuscitate: Secondary | ICD-10-CM | POA: Diagnosis present

## 2018-10-05 DIAGNOSIS — N184 Chronic kidney disease, stage 4 (severe): Secondary | ICD-10-CM | POA: Diagnosis present

## 2018-10-05 DIAGNOSIS — Z9989 Dependence on other enabling machines and devices: Secondary | ICD-10-CM | POA: Diagnosis not present

## 2018-10-05 DIAGNOSIS — I4581 Long QT syndrome: Secondary | ICD-10-CM | POA: Diagnosis present

## 2018-10-05 DIAGNOSIS — M7989 Other specified soft tissue disorders: Secondary | ICD-10-CM | POA: Diagnosis not present

## 2018-10-05 DIAGNOSIS — D631 Anemia in chronic kidney disease: Secondary | ICD-10-CM | POA: Diagnosis present

## 2018-10-05 DIAGNOSIS — E669 Obesity, unspecified: Secondary | ICD-10-CM | POA: Diagnosis present

## 2018-10-05 DIAGNOSIS — Z9089 Acquired absence of other organs: Secondary | ICD-10-CM | POA: Diagnosis not present

## 2018-10-05 DIAGNOSIS — K529 Noninfective gastroenteritis and colitis, unspecified: Secondary | ICD-10-CM | POA: Diagnosis present

## 2018-10-05 DIAGNOSIS — M1711 Unilateral primary osteoarthritis, right knee: Secondary | ICD-10-CM | POA: Diagnosis not present

## 2018-10-05 DIAGNOSIS — I272 Pulmonary hypertension, unspecified: Secondary | ICD-10-CM | POA: Diagnosis present

## 2018-10-05 HISTORY — DX: Chronic kidney disease, stage 5: N18.5

## 2018-10-05 LAB — BASIC METABOLIC PANEL
ANION GAP: 13 (ref 5–15)
BUN: 75 mg/dL — AB (ref 8–23)
CHLORIDE: 113 mmol/L — AB (ref 98–111)
CO2: 16 mmol/L — ABNORMAL LOW (ref 22–32)
Calcium: 9 mg/dL (ref 8.9–10.3)
Creatinine, Ser: 4.13 mg/dL — ABNORMAL HIGH (ref 0.44–1.00)
GFR calc Af Amer: 10 mL/min — ABNORMAL LOW (ref >60)
GFR, EST NON AFRICAN AMERICAN: 9 mL/min — AB (ref >60)
Glucose, Bld: 83 mg/dL (ref 70–99)
POTASSIUM: 4.3 mmol/L (ref 3.5–5.1)
SODIUM: 142 mmol/L (ref 135–145)

## 2018-10-05 LAB — ECHOCARDIOGRAM COMPLETE
Height: 63 in
WEIGHTICAEL: 3128 [oz_av]

## 2018-10-05 LAB — TROPONIN I: TROPONIN I: 0.07 ng/mL — AB (ref <0.03)

## 2018-10-05 MED ORDER — AMLODIPINE BESYLATE 10 MG PO TABS
10.0000 mg | ORAL_TABLET | Freq: Every day | ORAL | Status: DC
  Administered 2018-10-06 – 2018-10-08 (×3): 10 mg via ORAL
  Filled 2018-10-05 (×3): qty 1

## 2018-10-05 MED ORDER — FUROSEMIDE 10 MG/ML IJ SOLN
80.0000 mg | Freq: Four times a day (QID) | INTRAMUSCULAR | Status: DC
  Administered 2018-10-05 – 2018-10-08 (×10): 80 mg via INTRAVENOUS
  Filled 2018-10-05 (×12): qty 8

## 2018-10-05 MED ORDER — SPIRONOLACTONE 25 MG PO TABS: 25.0000 mg | ORAL_TABLET | Freq: Every day | ORAL | Status: DC

## 2018-10-05 MED ORDER — FUROSEMIDE 10 MG/ML IJ SOLN: 80.0000 mg | Freq: Two times a day (BID) | INTRAMUSCULAR | Status: DC

## 2018-10-05 MED ORDER — HYDRALAZINE HCL 25 MG PO TABS
25.0000 mg | ORAL_TABLET | Freq: Three times a day (TID) | ORAL | Status: DC
  Administered 2018-10-05 – 2018-10-08 (×10): 25 mg via ORAL
  Filled 2018-10-05 (×10): qty 1

## 2018-10-05 NOTE — Progress Notes (Signed)
Home CPAP placed on by staff, pt. Tolerating well, remains on room air.

## 2018-10-05 NOTE — Progress Notes (Addendum)
PROGRESS NOTE    Kathryn Peck   ZOX:[id number]  DOB: 14-Mar-1928  DOA: 10/04/2018 PCP: Aretta Nip, MD   Brief Narrative:  Kathryn Peck is a 84 y.o. female with medical history significant of CHF, hypertension, obesity, OSA, chronic kidney disease stage IV, who presents to the hospital with chief complaint of worsening shortness of breath as well as worsening bilateral lower extremity swelling over the last couple weeks.  She states that when she saw her pulmonologist about 10 days ago her weight was 193 pounds, prior to that at [facility] where she resides she was at 181.  She does not weigh herself daily.  She reports compliance with a low-salt diet as much as she can but she eats at her facility.  She denies any chest pain, denies any abdominal pain, nausea vomiting.  She reports intermittent diarrhea which is chronic for her. She reports good urine output on home Bumex daily.   Subjective: Feeling about the same as yesterday. Her daughter mentions that her mother loves to drink water and keeps a glass next to her all day long. She has has 3 falls in the past couple of weeks due to being unsteady.     Assessment & Plan:   Principal Problem:   CHFpEF exacerbation  - on Bumex at home but likely drinks excess fluids based on history, making it difficult to diurese - we have had a discussion today about restricting fluids - 2 D ECHO noted below shows an EF of 55-60%, grade 2 d CHF, mod Ao stenosis, mild Mitral stenosis, mild to mod TR, mod pulm HTN - increase Lasix to 80 QID for now- possibly can leave the hospital tomorrow- will ask for social work eval as I suspect she will need to go to SNF    Active Problems:  Mildly elevated troponin  - due to cardiac strain in setting of CKD- flat trend  Frequent falls - PT eval pending     CKD (chronic kidney disease) stage 5, GFR 15-29 ml/min - follow with Dr Posey Pronto- Cr is stable    Essential hypertension - might  improve with adequate diuresis - she is on Norvasc and Metoprolol - HR in 60s - will add Hydralazine 25 mg TID for now    COPD with chronic bronchitis - cont Mucinex & inhalers    Obesity (BMI 30-39.9) Body mass index is 34.63 kg/m.   OSA (obstructive sleep apnea) - CPAP  DVT prophylaxis:  Heparin Code Status: DNR Family Communication: daughter at bedside Disposition Plan: PT eval to check for need for higher level of care Consultants:   none Procedures:   2 D ECHO Study Conclusions  - Left ventricle: The cavity size was normal. There was mild   concentric hypertrophy. Systolic function was normal. The   estimated ejection fraction was in the range of 55% to 60%. Wall   motion was normal; there were no regional wall motion   abnormalities. Features are consistent with a pseudonormal left   ventricular filling pattern, with concomitant abnormal relaxation   and increased filling pressure (grade 2 diastolic dysfunction). - Aortic valve: Cusp separation was moderately reduced. Noncoronary   cusp mobility was severely restricted. There was moderate   stenosis. There was trivial regurgitation. Valve area (VTI): 1.13   cm^2. Valve area (Vmax): 1.31 cm^2. Valve area (Vmean): 1.21   cm^2. - Mitral valve: Severely calcified annulus. Mildly thickened   leaflets . The findings are consistent with mild stenosis. There  was moderate regurgitation directed eccentrically and   posteriorly. Mean gradient (D): 6 mm Hg. Gradients at 60 bpm.   Valve area by continuity equation (using LVOT flow): 1.39 cm^2. - Left atrium: The atrium was severely dilated. - Right ventricle: The cavity size was mildly dilated. - Right atrium: The atrium was moderately dilated. - Tricuspid valve: There was mild-moderate regurgitation directed   centrally. - Pulmonary arteries: Systolic pressure was moderately increased.   PA peak pressure: 62 mm Hg (S).  Antimicrobials:  Anti-infectives (From  admission, onward)   None       Objective: Vitals:   10/04/18 1623 10/04/18 1907 10/04/18 2201 10/05/18 0452  BP: (!) 181/83 (!) 176/64 (!) 169/59 (!) 165/68  Pulse: 96 67 (!) 53 63  Resp: 20  18 18   Temp: 97.8 F (36.6 C)  98.8 F (37.1 C) 98.1 F (36.7 C)  TempSrc: Oral  Oral Oral  SpO2: 90% 92% 95% 90%  Weight: 88.8 kg   88.7 kg  Height:        Intake/Output Summary (Last 24 hours) at 10/05/2018 1209 Last data filed at 10/05/2018 0033 Gross per 24 hour  Intake 240 ml  Output 700 ml  Net -460 ml   Filed Weights   10/04/18 1119 10/04/18 1623 10/05/18 0452  Weight: 87.5 kg 88.8 kg 88.7 kg    Examination: General exam: Appears comfortable  HEENT: PERRLA, oral mucosa moist, no sclera icterus or thrush Respiratory system: faint crackles at bases - 3+ pedal edema-  Respiratory effort normal. Cardiovascular system: S1 & S2 heard, RRR, 2/6 murmur at RUS border  Gastrointestinal system: Abdomen soft, non-tender, nondistended. Normal bowel sounds. Central nervous system: Alert and oriented. No focal neurological deficits. Extremities: No cyanosis, clubbing or edema Skin: No rashes or ulcers Psychiatry:  Mood & affect appropriate.     Data Reviewed: I have personally reviewed following labs and imaging studies  CBC: Recent Labs  Lab 10/04/18 1256  WBC 9.4  HGB 8.2*  HCT 28.0*  MCV 94.9  PLT 732   Basic Metabolic Panel: Recent Labs  Lab 10/04/18 1256 10/05/18 0356  NA 142 142  K 4.9 4.3  CL 113* 113*  CO2 17* 16*  GLUCOSE 103* 83  BUN 74* 75*  CREATININE 4.23* 4.13*  CALCIUM 9.3 9.0   GFR: Estimated Creatinine Clearance: 9.9 mL/min (A) (by C-G formula based on SCr of 4.13 mg/dL (H)). Liver Function Tests: No results for input(s): AST, ALT, ALKPHOS, BILITOT, PROT, ALBUMIN in the last 168 hours. No results for input(s): LIPASE, AMYLASE in the last 168 hours. No results for input(s): AMMONIA in the last 168 hours. Coagulation Profile: No results for  input(s): INR, PROTIME in the last 168 hours. Cardiac Enzymes: Recent Labs  Lab 10/04/18 1256 10/04/18 1637 10/04/18 2200 10/05/18 0356  TROPONINI 0.04* 0.03* 0.04* 0.07*   BNP (last 3 results) No results for input(s): PROBNP in the last 8760 hours. HbA1C: No results for input(s): HGBA1C in the last 72 hours. CBG: No results for input(s): GLUCAP in the last 168 hours. Lipid Profile: No results for input(s): CHOL, HDL, LDLCALC, TRIG, CHOLHDL, LDLDIRECT in the last 72 hours. Thyroid Function Tests: No results for input(s): TSH, T4TOTAL, FREET4, T3FREE, THYROIDAB in the last 72 hours. Anemia Panel: No results for input(s): VITAMINB12, FOLATE, FERRITIN, TIBC, IRON, RETICCTPCT in the last 72 hours. Urine analysis:    Component Value Date/Time   COLORURINE YELLOW 10/04/2018 1742   APPEARANCEUR CLEAR 10/04/2018 1742   LABSPEC 1.011  10/04/2018 1742   PHURINE 5.0 10/04/2018 1742   GLUCOSEU NEGATIVE 10/04/2018 1742   HGBUR NEGATIVE 10/04/2018 1742   BILIRUBINUR NEGATIVE 10/04/2018 1742   KETONESUR NEGATIVE 10/04/2018 1742   PROTEINUR 30 (A) 10/04/2018 1742   NITRITE NEGATIVE 10/04/2018 1742   LEUKOCYTESUR NEGATIVE 10/04/2018 1742   Sepsis Labs: @LABRCNTIP (procalcitonin:4,lacticidven:4) )No results found for this or any previous visit (from the past 240 hour(s)).       Radiology Studies: Dg Chest 2 View  Result Date: 10/04/2018 CLINICAL DATA:  Shortness of breath. EXAM: CHEST - 2 VIEW COMPARISON:  None. FINDINGS: Borderline cardiomegaly considering the AP technique. Aortic atherosclerosis. The main pulmonary artery is prominent. Dense calcification in the mitral valve annulus. No infiltrates or effusions. No acute bone abnormality. IMPRESSION: Borderline heart size.  Prominent main pulmonary artery. Aortic Atherosclerosis (ICD10-I70.0). Electronically Signed   By: Lorriane Shire M.D.   On: 10/04/2018 12:48      Scheduled Meds: . amLODipine  5 mg Oral Daily  . aspirin EC   81 mg Oral Daily  . furosemide  60 mg Intravenous Q12H  . heparin  5,000 Units Subcutaneous Q8H  . hydrALAZINE  25 mg Oral Q8H  . isosorbide mononitrate  30 mg Oral Daily  . latanoprost  1 drop Both Eyes QHS  . metoprolol succinate  50 mg Oral Daily  . simvastatin  10 mg Oral Daily  . sodium chloride flush  3 mL Intravenous Q12H  . umeclidinium bromide  2 puff Inhalation Daily   Continuous Infusions: . sodium chloride       LOS: 0 days    Time spent in minutes: 35    Debbe Odea, MD [facility] Pager: [email or web address] Password [facility] 10/05/2018, 12:09 PM

## 2018-10-05 NOTE — Progress Notes (Signed)
  Echocardiogram 2D Echocardiogram has been performed.  Kathryn Peck 10/05/2018, 9:20 AM

## 2018-10-06 DIAGNOSIS — E669 Obesity, unspecified: Secondary | ICD-10-CM

## 2018-10-06 DIAGNOSIS — I10 Essential (primary) hypertension: Secondary | ICD-10-CM

## 2018-10-06 DIAGNOSIS — I5031 Acute diastolic (congestive) heart failure: Secondary | ICD-10-CM

## 2018-10-06 DIAGNOSIS — N184 Chronic kidney disease, stage 4 (severe): Secondary | ICD-10-CM

## 2018-10-06 DIAGNOSIS — I5033 Acute on chronic diastolic (congestive) heart failure: Secondary | ICD-10-CM

## 2018-10-06 DIAGNOSIS — Z862 Personal history of diseases of the blood and blood-forming organs and certain disorders involving the immune mechanism: Secondary | ICD-10-CM

## 2018-10-06 DIAGNOSIS — N189 Chronic kidney disease, unspecified: Secondary | ICD-10-CM

## 2018-10-06 DIAGNOSIS — G4733 Obstructive sleep apnea (adult) (pediatric): Secondary | ICD-10-CM

## 2018-10-06 DIAGNOSIS — J449 Chronic obstructive pulmonary disease, unspecified: Secondary | ICD-10-CM

## 2018-10-06 DIAGNOSIS — E8770 Fluid overload, unspecified: Secondary | ICD-10-CM

## 2018-10-06 DIAGNOSIS — N185 Chronic kidney disease, stage 5: Secondary | ICD-10-CM

## 2018-10-06 LAB — BASIC METABOLIC PANEL
Anion gap: 12 (ref 5–15)
BUN: 77 mg/dL — AB (ref 8–23)
CHLORIDE: 111 mmol/L (ref 98–111)
CO2: 19 mmol/L — ABNORMAL LOW (ref 22–32)
Calcium: 8.9 mg/dL (ref 8.9–10.3)
Creatinine, Ser: 4.33 mg/dL — ABNORMAL HIGH (ref 0.44–1.00)
GFR, EST AFRICAN AMERICAN: 10 mL/min — AB (ref >60)
GFR, EST NON AFRICAN AMERICAN: 8 mL/min — AB (ref >60)
Glucose, Bld: 99 mg/dL (ref 70–99)
POTASSIUM: 3.6 mmol/L (ref 3.5–5.1)
Sodium: 142 mmol/L (ref 135–145)

## 2018-10-06 LAB — CBC WITH DIFFERENTIAL/PLATELET
Abs Immature Granulocytes: 0.04 10*3/uL (ref 0.00–0.07)
BASOS PCT: 1 %
Basophils Absolute: 0.1 10*3/uL (ref 0.0–0.1)
EOS ABS: 0.3 10*3/uL (ref 0.0–0.5)
Eosinophils Relative: 4 %
HCT: 26.3 % — ABNORMAL LOW (ref 36.0–46.0)
Hemoglobin: 7.9 g/dL — ABNORMAL LOW (ref 12.0–15.0)
IMMATURE GRANULOCYTES: 1 %
Lymphocytes Relative: 17 %
Lymphs Abs: 1.3 10*3/uL (ref 0.7–4.0)
MCH: 28.2 pg (ref 26.0–34.0)
MCHC: 30 g/dL (ref 30.0–36.0)
MCV: 93.9 fL (ref 80.0–100.0)
MONOS PCT: 13 %
Monocytes Absolute: 1 10*3/uL (ref 0.1–1.0)
NEUTROS ABS: 4.9 10*3/uL (ref 1.7–7.7)
NEUTROS PCT: 64 %
PLATELETS: 299 10*3/uL (ref 150–400)
RBC: 2.8 MIL/uL — AB (ref 3.87–5.11)
RDW: 17.6 % — AB (ref 11.5–15.5)
WBC: 7.5 10*3/uL (ref 4.0–10.5)
nRBC: 0 % (ref 0.0–0.2)

## 2018-10-06 LAB — MAGNESIUM: Magnesium: 2.4 mg/dL (ref 1.7–2.4)

## 2018-10-06 LAB — PHOSPHORUS: Phosphorus: 6.4 mg/dL — ABNORMAL HIGH (ref 2.5–4.6)

## 2018-10-06 LAB — BRAIN NATRIURETIC PEPTIDE: B NATRIURETIC PEPTIDE 5: 1597.2 pg/mL — AB (ref 0.0–100.0)

## 2018-10-06 MED ORDER — POTASSIUM CHLORIDE CRYS ER 20 MEQ PO TBCR
20.0000 meq | EXTENDED_RELEASE_TABLET | Freq: Two times a day (BID) | ORAL | Status: DC
  Administered 2018-10-06 – 2018-10-07 (×3): 20 meq via ORAL
  Filled 2018-10-06 (×3): qty 1

## 2018-10-06 MED ORDER — LEVALBUTEROL HCL 0.63 MG/3ML IN NEBU
0.6300 mg | INHALATION_SOLUTION | Freq: Four times a day (QID) | RESPIRATORY_TRACT | Status: DC
  Administered 2018-10-06 – 2018-10-08 (×8): 0.63 mg via RESPIRATORY_TRACT
  Filled 2018-10-06 (×8): qty 3

## 2018-10-06 NOTE — Progress Notes (Signed)
PROGRESS NOTE    Kathryn Peck  MBW:[id number] DOB: February 17, 1928 DOA: 10/04/2018 PCP: Aretta Nip, MD  Brief Narrative:  Kathryn Peck is a 84 y.o.femalewith medical history significant ofCHF, hypertension, obesity, OSA, chronic kidney disease stage IV, who presents to the hospital with chief complaint of worsening shortness of breath as well as worsening bilateral lower extremity swelling over the last couple weeks. She states that when she saw her pulmonologist about 10 days ago her weight was 193 pounds, prior to that at [facility] where she resides she was at 181. She does not weigh herself daily. She reports compliance with a low-salt diet as much as she can but she eats at her facility. She denies any chest pain, denies any abdominal pain, nausea vomiting. She reports intermittent diarrhea which is chronic for her. She reports good urine output on home Bumex daily.  Assessment & Plan:   Principal Problem:   CHF exacerbation ([place]) Active Problems:   OSA (obstructive sleep apnea)   Essential hypertension   COPD with chronic bronchitis (HCC)   Obesity (BMI 30-39.9)   CKD (chronic kidney disease) stage 5, GFR less than 15 ml/min (HCC)   Hypervolemia   Acute systolic CHF (congestive heart failure) (HCC)   Acute diastolic CHF (congestive heart failure) (HCC)  Acute on chronic grade 2 diastolic CHF exacerbation with preserved ejection fraction of 55 to 60% -BNP was elevated at 1703 and trended down slightly to 1597 -Patient takes Bumex at home but drinks excessive fluids on history likely making it difficult diuresis properly -Strict I's and O's, daily weights, saline lock IV and fluid restrict to 100 and mils per day -Patient is -640 mL's and weight is essentially the same since admission -Continue with IV diuresis with furosemide 80 mg every 6 and likely change to p.o. Bumex in the a.m she still remains somewhat volume overload -Continue monitor volume status  very carefully -Continue with hydralazine 25 mg p.o. every 8, isosorbide mononitrate 30 mg p.o. daily, metoprolol succinate 50 mg p.o. Daily -We will need a home amatory screen in the a.m.  Mildly elevated troponin  -Has a flat appearing trend likely due to cardiac strain in the setting of chronic kidney disease -Troponin level went from 0.04 -> 0.03 -> 0.04 -> 0.07 -Patient denies any active chest pain -Continue telemetry monitoring  Recurrent falls -PT evaluation is pending  CKD stage IV/V -Creatinine is around baseline (4.00) and is 4.33 today -We will need to watch creatinine very closely especially being diuresed with IV Lasix -Avoid nephrotoxic medications if possible  -Continue to monitor renal function and trend and repeat CMP in a.m.  Essential Hypertension -Continue with amlodipine 10 mg p.o. daily as well as metoprolol succinate p.o. daily -Continue with hydralazine 25 mg p.o. 3 times daily for now -Continue with IV diuresis with Lasix 80 mg every 6h scheduled   Anemia of chronic kidney disease -Patient's hemoglobin/hematocrit went from 8.2/28.0 and is now 7.9/26.3 -Check anemia panel in the a.m. -Continue to monitor for signs and symptoms of bleeding -Repeat CBC in the a.m.  Hyperphosphatemia -Likely in the setting of renal dysfunction -Patient's phosphorus level 6.4 this morning -Continue monitor and trend phos Level   Non-gap metabolic acidosis -The setting of chronic kidney disease -Patient's CO2 is 19 -Repeat CMP in a.m.  Obesity -Estimated body mass index is 34.17 kg/m as calculated from the following:   Height as of this encounter: 5\' 3"  (1.6 m).   Weight as of this encounter: 87.5  kg.  -Weight Loss Counseling Given  OSA -C/w Nocturnal CPAP  COPD with chronic bronchitis -Started levalbuterol every 6 scheduled -Continue with Incruse Ellipta 2 puffs twice daily -Was given guaifenesin however now has been stopped  Hyperlipidemia -Continue home  statin  DVT prophylaxis: Heparin 5,000 units sq q8h Code Status: DO NOT RESUSCITATE Family Communication: Discussed with daughter at bedside Disposition Plan: Pending PT Evaluation  Consultants:   None   Procedures:  ECHOCARDIOGRAM ------------------------------------------------------------------- Study Conclusions  - Left ventricle: The cavity size was normal. There was mild   concentric hypertrophy. Systolic function was normal. The   estimated ejection fraction was in the range of 55% to 60%. Wall   motion was normal; there were no regional wall motion   abnormalities. Features are consistent with a pseudonormal left   ventricular filling pattern, with concomitant abnormal relaxation   and increased filling pressure (grade 2 diastolic dysfunction). - Aortic valve: Cusp separation was moderately reduced. Noncoronary   cusp mobility was severely restricted. There was moderate   stenosis. There was trivial regurgitation. Valve area (VTI): 1.13   cm^2. Valve area (Vmax): 1.31 cm^2. Valve area (Vmean): 1.21   cm^2. - Mitral valve: Severely calcified annulus. Mildly thickened   leaflets . The findings are consistent with mild stenosis. There   was moderate regurgitation directed eccentrically and   posteriorly. Mean gradient (D): 6 mm Hg. Gradients at 60 bpm.   Valve area by continuity equation (using LVOT flow): 1.39 cm^2. - Left atrium: The atrium was severely dilated. - Right ventricle: The cavity size was mildly dilated. - Right atrium: The atrium was moderately dilated. - Tricuspid valve: There was mild-moderate regurgitation directed   centrally. - Pulmonary arteries: Systolic pressure was moderately increased.   PA peak pressure: 62 mm Hg (S).    Antimicrobials:  Anti-infectives (From admission, onward)   None     Subjective: Seen and examined and was weaned off oxygen.  States that she feels better however still remain puffy.  No chest pain, lightheadedness  but did have some mild wheezing again.  No other concerns or complaints at this time is wanting to go home.  Objective: Vitals:   10/06/18 0815 10/06/18 0840 10/06/18 1034 10/06/18 1107  BP:  (!) 173/63 (!) 181/65 (!) 187/76  Pulse:  61    Resp:  20    Temp:  97.9 F (36.6 C)    TempSrc:  Oral    SpO2: 99% 96%    Weight:      Height:        Intake/Output Summary (Last 24 hours) at 10/06/2018 1151 Last data filed at 10/06/2018 1000 Gross per 24 hour  Intake 880 ml  Output 1300 ml  Net -420 ml   Filed Weights   10/04/18 1623 10/05/18 0452 10/06/18 0553  Weight: 88.8 kg 88.7 kg 87.5 kg   Examination: Physical Exam:  Constitutional: WN/WD obese elderly Caucasian female in NAD and appears calm and comfortable Eyes:  Lids and conjunctivae normal, sclerae anicteric  ENMT: External Ears, Nose appear normal. Slightly hard of hearing. Mucous membranes are moist.  Neck: Appears normal, supple, no cervical masses, normal ROM, no appreciable thyromegaly; Mild JVD Respiratory: Diminished to auscultation bilaterally with some expiratory wheezing; No appreciable rales, rhonchi or crackles. Normal respiratory effort and patient is not tachypenic. No accessory muscle use.  Cardiovascular: RRR, no murmurs / rubs / gallops. S1 and S2 auscultated. 1+ extremity edema.  Abdomen: Soft, non-tender, Distended due to body habitus. No masses  palpated. No appreciable hepatosplenomegaly. Bowel sounds positive x4.  GU: Deferred. Musculoskeletal: No clubbing / cyanosis of digits/nails. No joint deformity upper and lower extremities. Good ROM, no contractures. Normal strength and muscle tone.  Skin: No rashes, lesions, ulcers on a limited skin evaluation. No induration; Warm and dry.  Neurologic: CN 2-12 grossly intact with no focal deficits. Romberg sign and cerebellar reflexes not assessed.  Psychiatric: Normal judgment and insight. Alert and oriented x 3. Normal mood and appropriate affect.   Data  Reviewed: I have personally reviewed following labs and imaging studies  CBC: Recent Labs  Lab 10/04/18 1256 10/06/18 0544  WBC 9.4 7.5  NEUTROABS  --  4.9  HGB 8.2* 7.9*  HCT 28.0* 26.3*  MCV 94.9 93.9  PLT 318 119   Basic Metabolic Panel: Recent Labs  Lab 10/04/18 1256 10/05/18 0356 10/06/18 0544  NA 142 142 142  K 4.9 4.3 3.6  CL 113* 113* 111  CO2 17* 16* 19*  GLUCOSE 103* 83 99  BUN 74* 75* 77*  CREATININE 4.23* 4.13* 4.33*  CALCIUM 9.3 9.0 8.9  MG  --   --  2.4  PHOS  --   --  6.4*   GFR: Estimated Creatinine Clearance: 9.4 mL/min (A) (by C-G formula based on SCr of 4.33 mg/dL (H)). Liver Function Tests: No results for input(s): AST, ALT, ALKPHOS, BILITOT, PROT, ALBUMIN in the last 168 hours. No results for input(s): LIPASE, AMYLASE in the last 168 hours. No results for input(s): AMMONIA in the last 168 hours. Coagulation Profile: No results for input(s): INR, PROTIME in the last 168 hours. Cardiac Enzymes: Recent Labs  Lab 10/04/18 1256 10/04/18 1637 10/04/18 2200 10/05/18 0356  TROPONINI 0.04* 0.03* 0.04* 0.07*   BNP (last 3 results) No results for input(s): PROBNP in the last 8760 hours. HbA1C: No results for input(s): HGBA1C in the last 72 hours. CBG: No results for input(s): GLUCAP in the last 168 hours. Lipid Profile: No results for input(s): CHOL, HDL, LDLCALC, TRIG, CHOLHDL, LDLDIRECT in the last 72 hours. Thyroid Function Tests: No results for input(s): TSH, T4TOTAL, FREET4, T3FREE, THYROIDAB in the last 72 hours. Anemia Panel: No results for input(s): VITAMINB12, FOLATE, FERRITIN, TIBC, IRON, RETICCTPCT in the last 72 hours. Sepsis Labs: No results for input(s): PROCALCITON, LATICACIDVEN in the last 168 hours.  No results found for this or any previous visit (from the past 240 hour(s)).   Radiology Studies: Dg Chest 2 View  Result Date: 10/04/2018 CLINICAL DATA:  Shortness of breath. EXAM: CHEST - 2 VIEW COMPARISON:  None. FINDINGS:  Borderline cardiomegaly considering the AP technique. Aortic atherosclerosis. The main pulmonary artery is prominent. Dense calcification in the mitral valve annulus. No infiltrates or effusions. No acute bone abnormality. IMPRESSION: Borderline heart size.  Prominent main pulmonary artery. Aortic Atherosclerosis (ICD10-I70.0). Electronically Signed   By: Lorriane Shire M.D.   On: 10/04/2018 12:48   Scheduled Meds: . amLODipine  10 mg Oral Daily  . aspirin EC  81 mg Oral Daily  . furosemide  80 mg Intravenous Q6H  . heparin  5,000 Units Subcutaneous Q8H  . hydrALAZINE  25 mg Oral Q8H  . isosorbide mononitrate  30 mg Oral Daily  . latanoprost  1 drop Both Eyes QHS  . levalbuterol  0.63 mg Nebulization Q6H  . metoprolol succinate  50 mg Oral Daily  . potassium chloride  20 mEq Oral BID  . simvastatin  10 mg Oral Daily  . sodium chloride flush  3  mL Intravenous Q12H  . umeclidinium bromide  2 puff Inhalation Daily   Continuous Infusions: . sodium chloride      LOS: 1 day    Kerney Elbe, DO [facility] PAGER is on AMION  If 7PM-7AM, please contact night-coverage [email or web address] Password [email or web address] 10/06/2018, 11:51 AM

## 2018-10-06 NOTE — Care Management Note (Signed)
Case Management Note  Patient Details  Name: Orena Cavazos MRN: [id number] Date of Birth: 1928/09/20  Subjective/Objective:  From ALF-[facility]. CHF. PT cons. On 02-will monitor if needed @ ALF.CSW following.                  Action/Plan:d/c ALF   Expected Discharge Date:  (unknown)               Expected Discharge Plan:  Assisted Living / Rest Home  In-House Referral:  Clinical Social Work  Discharge planning Services  CM Consult  Post Acute Care Choice:    Choice offered to:     DME Arranged:    DME Agency:     HH Arranged:    [place] Agency:     Status of Service:  In process, will continue to follow  If discussed at Long Length of Stay Meetings, dates discussed:    Additional Comments:  Dessa Phi, RN 10/06/2018, 12:37 PM

## 2018-10-06 NOTE — Evaluation (Signed)
Occupational Therapy Evaluation Patient Details Name: Kathryn Peck MRN: [id number] DOB: [birth year] Today's Date: 10/06/2018    History of Present Illness Bonney Berres is a 84 y.o. female with medical history significant of CHF, hypertension, obesity, OSA, chronic kidney disease stage IV, who presents to the hospital with chief complaint of worsening shortness of breath as well as worsening bilateral lower extremity swelling over the last couple weeks. Pt is from [facility].   Clinical Impression   Pt is admitted with above diagnoses. Activity tolerance and cardiopulmonary status severely limiting functional mobility this date. Pt on 1L O2, does not wear O2 at baseline. Met pt on BSC, assisted to t/f back to bed. Toilet hygiene completed at total A, with pt partially standing with heavy reliance of BUE support. Pt needing to sit x3 for OT to complete entirety of hygiene. Pt requiring max A for stand pivot after x3 attempt, unable to achieve full standing. Pt with increased anxiety with all movement this date, fear of falling. DOE noted throughout t/f. Increased cues needed for hand placement 2/2 anxiety. Min A sit > supine once back to bed. Pt O2 sats 95 on 1L O2. Recommending SNF at this time due to decreased activity tolerance to complete functional BADL, although pt persistent with returning to ALF with [facility]. Will continue to follow pt acutely, to progress functional mobility in BADL and improve activity tolerance.    Follow Up Recommendations  SNF;Other (comment)(pt insistent on returning to ALF With HHOT)    Equipment Recommendations  3 in 1 bedside commode (if returning to facility)   Recommendations for Other Services       Precautions / Restrictions Precautions Precautions: Fall Restrictions Weight Bearing Restrictions: No      Mobility Bed Mobility Overal bed mobility: Needs Assistance Bed Mobility: Sit to Supine       Sit to supine: Min assist   General  bed mobility comments: needing A to scoot hips back in bed (will slide- how previous falls have happened), min A for BLE mgt  Transfers Overall transfer level: Needs assistance Equipment used: None Transfers: Stand Pivot Transfers   Stand pivot transfers: Max assist       General transfer comment: pt requiring max A from [facility] to bed x3 attempts to achieve standing before fatigue. On x3 attempt enaged in slow pivot, stating her feet feel "off" (from edema), needing guidance of RLE to follow in t/f    Balance Overall balance assessment: Needs assistance Sitting-balance support: Bilateral upper extremity supported Sitting balance-Leahy Scale: Fair       Standing balance-Leahy Scale: Zero Standing balance comment: unable to achieve full standing, heavy reliance on BUE support in any transfer                           ADL either performed or assessed with clinical judgement   ADL Overall ADL's : Needs assistance/impaired Eating/Feeding: Set up;Bed level   Grooming: Minimal assistance;Sitting   Upper Body Bathing: Sitting;Minimal assistance   Lower Body Bathing: Total assistance;Sit to/from stand   Upper Body Dressing : Minimal assistance;Sitting   Lower Body Dressing: Total assistance;Sitting/lateral leans   Toilet Transfer: Maximal assistance;Stand-pivot;BSC   Toileting- Clothing Manipulation and Hygiene: Total assistance;Sit to/from stand Toileting - Clothing Manipulation Details (indicate cue type and reason): pt stood hanging onto commode, high reliance on BUE support, total A and supply set up for hygiene Tub/ Shower Transfer: Total assistance;Shower seat   Functional mobility  during ADLs: Maximal assistance General ADL Comments: Pt presents with extreme deficit in activity tolerance to complete BADL. DOE noted with functional transfer and activity. Session focused on BSC t/f, pt needing x3 attempts to obtain standing. Presents with increased anxiety upon  movement, and BLE edema.      Vision Baseline Vision/History: Wears glasses Wears Glasses: At all times Patient Visual Report: No change from baseline       Perception     Praxis      Pertinent Vitals/Pain Pain Assessment: No/denies pain     Hand Dominance     Extremity/Trunk Assessment Upper Extremity Assessment Upper Extremity Assessment: Generalized weakness   Lower Extremity Assessment Lower Extremity Assessment: Generalized weakness       Communication Communication Communication: HOH   Cognition Arousal/Alertness: Awake/alert Behavior During Therapy: WFL for tasks assessed/performed Overall Cognitive Status: Within Functional Limits for tasks assessed                                     General Comments  significan non pitting edema below knee in BLEs. TED hoes applied by RN Staff, noted increased swelling at ankle. Informed RN staff to acquire larger socks    Exercises     Shoulder Instructions      Home Living Family/patient expects to be discharged to:: Assisted living                             Home Equipment: Shower seat;Walker - 4 wheels;Transport chair          Prior Functioning/Environment Level of Independence: Needs assistance  Gait / Transfers Assistance Needed: pt reports using 4WW in apt, but needing w/c to go to and from meals in the facility ADL's / Homemaking Assistance Needed: pt reports completing all ADLs at baseline, daugther states last week she had to help her shower (she had not in a week due to feeling unsteady)            OT Problem List: Decreased strength;Impaired balance (sitting and/or standing);Cardiopulmonary status limiting activity;Increased edema;Decreased activity tolerance;Decreased knowledge of use of DME or AE      OT Treatment/Interventions: Self-care/ADL training;DME and/or AE instruction;Balance training;Therapeutic exercise;Therapeutic activities;Energy  conservation;Patient/family education    OT Goals(Current goals can be found in the care plan section) Acute Rehab OT Goals Patient Stated Goal: to return to ALF OT Goal Formulation: With patient/family Time For Goal Achievement: 10/20/18 Potential to Achieve Goals: Good  OT Frequency: Min 2X/week   Barriers to D/C:            Co-evaluation              AM-PAC PT "6 Clicks" Daily Activity     Outcome Measure Help from another person eating meals?: A Little Help from another person taking care of personal grooming?: A Little Help from another person toileting, which includes using toliet, bedpan, or urinal?: A Lot Help from another person bathing (including washing, rinsing, drying)?: Total Help from another person to put on and taking off regular upper body clothing?: A Little Help from another person to put on and taking off regular lower body clothing?: Total 6 Click Score: 13   End of Session Equipment Utilized During Treatment: Oxygen Nurse Communication: Mobility status(needing purewick replaced)  Activity Tolerance: Patient tolerated treatment well Patient left: in bed;with call bell/phone within reach;with family/visitor present  OT Visit Diagnosis: Other abnormalities of gait and mobility (R26.89);Muscle weakness (generalized) (M62.81);History of falling (Z91.81)                Time: 3009-2330 OT Time Calculation (min): 32 min Charges:  OT General Charges $OT Visit: 1 Visit OT Evaluation $OT Eval Moderate Complexity: 1 Mod OT Treatments $Self Care/Home Management : 8-22 mins  Zenovia Jarred, MSOT, OTR/L Behavioral Health OT/ Acute Relief OT [phone]  Zenovia Jarred 10/06/2018, 5:13 PM

## 2018-10-07 ENCOUNTER — Inpatient Hospital Stay (HOSPITAL_COMMUNITY): Payer: Medicare Other

## 2018-10-07 ENCOUNTER — Encounter (HOSPITAL_COMMUNITY): Payer: Self-pay

## 2018-10-07 DIAGNOSIS — M7989 Other specified soft tissue disorders: Secondary | ICD-10-CM

## 2018-10-07 LAB — COMPREHENSIVE METABOLIC PANEL
ALT: 17 U/L (ref 0–44)
AST: 13 U/L — AB (ref 15–41)
Albumin: 3.4 g/dL — ABNORMAL LOW (ref 3.5–5.0)
Alkaline Phosphatase: 43 U/L (ref 38–126)
Anion gap: 11 (ref 5–15)
BUN: 74 mg/dL — ABNORMAL HIGH (ref 8–23)
CHLORIDE: 111 mmol/L (ref 98–111)
CO2: 21 mmol/L — AB (ref 22–32)
CREATININE: 4.24 mg/dL — AB (ref 0.44–1.00)
Calcium: 9.1 mg/dL (ref 8.9–10.3)
GFR calc non Af Amer: 9 mL/min — ABNORMAL LOW (ref >60)
GFR, EST AFRICAN AMERICAN: 10 mL/min — AB (ref >60)
Glucose, Bld: 111 mg/dL — ABNORMAL HIGH (ref 70–99)
Potassium: 3.5 mmol/L (ref 3.5–5.1)
SODIUM: 143 mmol/L (ref 135–145)
Total Bilirubin: 0.6 mg/dL (ref 0.3–1.2)
Total Protein: 6.9 g/dL (ref 6.5–8.1)

## 2018-10-07 LAB — CBC WITH DIFFERENTIAL/PLATELET
ABS IMMATURE GRANULOCYTES: 0.04 10*3/uL (ref 0.00–0.07)
BASOS PCT: 1 %
Basophils Absolute: 0.1 10*3/uL (ref 0.0–0.1)
Eosinophils Absolute: 0.2 10*3/uL (ref 0.0–0.5)
Eosinophils Relative: 3 %
HCT: 26.7 % — ABNORMAL LOW (ref 36.0–46.0)
Hemoglobin: 7.8 g/dL — ABNORMAL LOW (ref 12.0–15.0)
IMMATURE GRANULOCYTES: 1 %
Lymphocytes Relative: 14 %
Lymphs Abs: 1.1 10*3/uL (ref 0.7–4.0)
MCH: 27.7 pg (ref 26.0–34.0)
MCHC: 29.2 g/dL — ABNORMAL LOW (ref 30.0–36.0)
MCV: 94.7 fL (ref 80.0–100.0)
Monocytes Absolute: 1.1 10*3/uL — ABNORMAL HIGH (ref 0.1–1.0)
Monocytes Relative: 14 %
NEUTROS ABS: 5.3 10*3/uL (ref 1.7–7.7)
NRBC: 0 % (ref 0.0–0.2)
Neutrophils Relative %: 67 %
PLATELETS: 298 10*3/uL (ref 150–400)
RBC: 2.82 MIL/uL — AB (ref 3.87–5.11)
RDW: 17.4 % — ABNORMAL HIGH (ref 11.5–15.5)
WBC: 7.8 10*3/uL (ref 4.0–10.5)

## 2018-10-07 LAB — PHOSPHORUS: PHOSPHORUS: 6.5 mg/dL — AB (ref 2.5–4.6)

## 2018-10-07 LAB — MAGNESIUM: Magnesium: 2.4 mg/dL (ref 1.7–2.4)

## 2018-10-07 MED ORDER — POTASSIUM CHLORIDE CRYS ER 20 MEQ PO TBCR
20.0000 meq | EXTENDED_RELEASE_TABLET | Freq: Once | ORAL | Status: AC
  Administered 2018-10-07: 20 meq via ORAL
  Filled 2018-10-07: qty 1

## 2018-10-07 MED ORDER — POTASSIUM CHLORIDE CRYS ER 20 MEQ PO TBCR
40.0000 meq | EXTENDED_RELEASE_TABLET | Freq: Two times a day (BID) | ORAL | Status: DC
  Administered 2018-10-07 – 2018-10-08 (×2): 40 meq via ORAL
  Filled 2018-10-07 (×2): qty 2

## 2018-10-07 NOTE — Progress Notes (Signed)
PROGRESS NOTE    Kathryn Peck  BJY:[id number] DOB: Mar 08, 1928 DOA: 10/04/2018 PCP: Aretta Nip, MD  Brief Narrative:  Kathryn Peck is a 84 y.o.femalewith medical history significant ofCHF, hypertension, obesity, OSA, chronic kidney disease stage IV, who presented to the hospital with chief complaint of worsening shortness of breath as well as worsening bilateral lower extremity swelling over the last couple weeks. She states that when she saw her pulmonologist about 10 days ago her weight was 193 pounds, prior to that at [facility] where she resides she was at 181. She does not weigh herself daily. She reports compliance with a low-salt diet as much as she can but she eats at her facility. She denied any chest pain, denies any abdominal pain, nausea vomiting. She reported intermittent diarrhea which is chronic for her. She reports good urine output on home Bumex daily.  **She was worked up and found to have acute on chronic grade 2 diastolic CHF exacerbation is currently being diuresed aggressively.  Assessment & Plan:   Principal Problem:   CHF exacerbation ([place]) Active Problems:   OSA (obstructive sleep apnea)   Essential hypertension   COPD with chronic bronchitis (HCC)   Obesity (BMI 30-39.9)   CKD (chronic kidney disease) stage 5, GFR less than 15 ml/min (HCC)   Hypervolemia   Acute diastolic CHF (congestive heart failure) (HCC)  Acute on chronic grade 2 diastolic CHF exacerbation with preserved ejection fraction of 55 to 60% -BNP was elevated at 1703 and trended down slightly to 1597 -Patient takes Bumex at home but drinks excessive fluids on history likely making it difficult diuresis properly -Strict I's and O's, daily weights, saline lock IV and fluid restrict to 1200 and mils per day -Patient is -2,340 mL's and weight is essentially the same since admission -Continue with IV diuresis with furosemide 80 mg every 6 and likely change to p.o. Bumex in  the a.m she still remains somewhat volume overload -Continue monitor volume status very carefully -Continue with Hydralazine 25 mg p.o. every 8 hours, Isosorbide mononitrate 30 mg p.o. daily, Metoprolol Succinate 50 mg p.o. Daily -Did ambulatory screen to check for oxygen and patient did not desaturate and she was on 1 L of oxygen and was weaned off -Due to bilateral lower extremity venous duplex to rule out DVT and is negative for DVT  Mildly elevated troponin  -Has a flat appearing trend likely due to cardiac strain in the setting of chronic kidney disease -Troponin level went from 0.04 -> 0.03 -> 0.04 -> 0.07 -Patient denies any active chest pain -Continue telemetry monitoring  Recurrent falls -PT evaluation is pending  CKD stage IV/V -Creatinine is around baseline (4.00) and is 4.24 today -We will need to watch creatinine very closely especially being diuresed with IV Lasix -Avoid nephrotoxic medications if possible  -Continue to monitor renal function and trend and repeat CMP in a.m.  Essential Hypertension -Continue with amlodipine 10 mg p.o. daily as well as metoprolol succinate p.o. daily -Continue with hydralazine 25 mg p.o. 3 times daily for now -Continue with IV diuresis with Lasix 80 mg every 6h scheduled   Anemia of chronic kidney disease -Patient's hemoglobin/hematocrit went from 8.2/28.0 and is now 7.8/26.7 -Check Anemia panel in the a.m. -Continue to monitor for signs and symptoms of bleeding -Repeat CBC in the a.m.  Hyperphosphatemia -Likely in the setting of renal dysfunction -Patient's phosphorus level 6.5 this morning -Continue monitor and trend phos Level   Non-gap metabolic acidosis -In The setting  of chronic kidney disease -Patient's CO2 is 21 -Repeat CMP in a.m.  Obesity -Estimated body mass index is 34.01 kg/m as calculated from the following:   Height as of this encounter: 5\' 3"  (1.6 m).   Weight as of this encounter: 87.1 kg.  -Weight Loss  Counseling Given  OSA -C/w Nocturnal CPAP  COPD with chronic bronchitis -Started levalbuterol every 6 scheduled -Continue with Incruse Ellipta 2 puffs twice daily -Was given guaifenesin however now has been stopped  Hyperlipidemia -Continue home Simvastatin 10 mg po Daily   Right Knee Pain -Patient has fallen multiple times in the last few weeks -Check x-ray  DVT prophylaxis: Heparin 5,000 units sq q8h Code Status: DO NOT RESUSCITATE Family Communication: No family present at bedside Disposition Plan: Pending PT Evaluation  Consultants:   None   Procedures:  ECHOCARDIOGRAM ------------------------------------------------------------------- Study Conclusions  - Left ventricle: The cavity size was normal. There was mild   concentric hypertrophy. Systolic function was normal. The   estimated ejection fraction was in the range of 55% to 60%. Wall   motion was normal; there were no regional wall motion   abnormalities. Features are consistent with a pseudonormal left   ventricular filling pattern, with concomitant abnormal relaxation   and increased filling pressure (grade 2 diastolic dysfunction). - Aortic valve: Cusp separation was moderately reduced. Noncoronary   cusp mobility was severely restricted. There was moderate   stenosis. There was trivial regurgitation. Valve area (VTI): 1.13   cm^2. Valve area (Vmax): 1.31 cm^2. Valve area (Vmean): 1.21   cm^2. - Mitral valve: Severely calcified annulus. Mildly thickened   leaflets . The findings are consistent with mild stenosis. There   was moderate regurgitation directed eccentrically and   posteriorly. Mean gradient (D): 6 mm Hg. Gradients at 60 bpm.   Valve area by continuity equation (using LVOT flow): 1.39 cm^2. - Left atrium: The atrium was severely dilated. - Right ventricle: The cavity size was mildly dilated. - Right atrium: The atrium was moderately dilated. - Tricuspid valve: There was mild-moderate  regurgitation directed   centrally. - Pulmonary arteries: Systolic pressure was moderately increased.   PA peak pressure: 62 mm Hg (S).   Antimicrobials:  Anti-infectives (From admission, onward)   None     Subjective: Seen and examined and was weaned off oxygen yesterday to be placed on oxygen earlier this morning.  She denies any chest pain, lightheadedness or dizziness.  Had some wheezing.  Feels still a little puffy and wanting to get out of bed.  No nausea or vomiting.  No other concerns or complaints at this time  Objective: Vitals:   10/07/18 0152 10/07/18 0606 10/07/18 0635 10/07/18 0731  BP:   (!) 146/74   Pulse:  61    Resp:  18    Temp:  98.7 F (37.1 C)    TempSrc:  Oral    SpO2: 94% 94%  98%  Weight:  87.1 kg    Height:        Intake/Output Summary (Last 24 hours) at 10/07/2018 1253 Last data filed at 10/07/2018 0732 Gross per 24 hour  Intake -  Output 1700 ml  Net -1700 ml   Filed Weights   10/05/18 0452 10/06/18 0553 10/07/18 0606  Weight: 88.7 kg 87.5 kg 87.1 kg   Examination: Physical Exam:  Constitutional: Well-nourished, well-developed elderly Caucasian female currently no acute distress appears calm and seems more comfortable Eyes: Conjunctive are normal.  Sclera anicteric ENMT: External ears and nose appear  normal.  Slightly hard of hearing.  Mucous membranes appear moist Neck: Appears supple no JVD Respiratory: Diminished to auscultation bilaterally with no appreciable wheezing, rales, rhonchi.  Patient not tachypneic wheezing excess muscle breathe but was wearing supplemental oxygen via nasal cannula and was wearing 1 L Cardiovascular: Regular rate and rhythm.  Has a 3/6 systolic murmur.  Has 1+ lower extremity edema still is wearing her compression stockings Abdomen: Soft, nontender, distended secondary body habitus.  Bowel sounds present in 4 quadrants GU: Deferred Musculoskeletal: No contractures cyanosis.  Good range of motion. Skin: Skin  is warm and dry.  No appreciable rashes or lesions limited skin evaluation Neurologic: Cranial nerves II through XII grossly intact no appreciable focal deficits.  Romberg sign and cerebellar reflexes not assessed Psychiatric: Normal judgment and insight.  Patient is awake, alert and oriented x3.  Data Reviewed: I have personally reviewed following labs and imaging studies  CBC: Recent Labs  Lab 10/04/18 1256 10/06/18 0544 10/07/18 0516  WBC 9.4 7.5 7.8  NEUTROABS  --  4.9 5.3  HGB 8.2* 7.9* 7.8*  HCT 28.0* 26.3* 26.7*  MCV 94.9 93.9 94.7  PLT 318 299 967   Basic Metabolic Panel: Recent Labs  Lab 10/04/18 1256 10/05/18 0356 10/06/18 0544 10/07/18 0516  NA 142 142 142 143  K 4.9 4.3 3.6 3.5  CL 113* 113* 111 111  CO2 17* 16* 19* 21*  GLUCOSE 103* 83 99 111*  BUN 74* 75* 77* 74*  CREATININE 4.23* 4.13* 4.33* 4.24*  CALCIUM 9.3 9.0 8.9 9.1  MG  --   --  2.4 2.4  PHOS  --   --  6.4* 6.5*   GFR: Estimated Creatinine Clearance: 9.6 mL/min (A) (by C-G formula based on SCr of 4.24 mg/dL (H)). Liver Function Tests: Recent Labs  Lab 10/07/18 0516  AST 13*  ALT 17  ALKPHOS 43  BILITOT 0.6  PROT 6.9  ALBUMIN 3.4*   No results for input(s): LIPASE, AMYLASE in the last 168 hours. No results for input(s): AMMONIA in the last 168 hours. Coagulation Profile: No results for input(s): INR, PROTIME in the last 168 hours. Cardiac Enzymes: Recent Labs  Lab 10/04/18 1256 10/04/18 1637 10/04/18 2200 10/05/18 0356  TROPONINI 0.04* 0.03* 0.04* 0.07*   BNP (last 3 results) No results for input(s): PROBNP in the last 8760 hours. HbA1C: No results for input(s): HGBA1C in the last 72 hours. CBG: No results for input(s): GLUCAP in the last 168 hours. Lipid Profile: No results for input(s): CHOL, HDL, LDLCALC, TRIG, CHOLHDL, LDLDIRECT in the last 72 hours. Thyroid Function Tests: No results for input(s): TSH, T4TOTAL, FREET4, T3FREE, THYROIDAB in the last 72 hours. Anemia  Panel: No results for input(s): VITAMINB12, FOLATE, FERRITIN, TIBC, IRON, RETICCTPCT in the last 72 hours. Sepsis Labs: No results for input(s): PROCALCITON, LATICACIDVEN in the last 168 hours.  No results found for this or any previous visit (from the past 240 hour(s)).   Radiology Studies: No results found. Scheduled Meds: . amLODipine  10 mg Oral Daily  . aspirin EC  81 mg Oral Daily  . furosemide  80 mg Intravenous Q6H  . heparin  5,000 Units Subcutaneous Q8H  . hydrALAZINE  25 mg Oral Q8H  . isosorbide mononitrate  30 mg Oral Daily  . latanoprost  1 drop Both Eyes QHS  . levalbuterol  0.63 mg Nebulization Q6H  . metoprolol succinate  50 mg Oral Daily  . potassium chloride  40 mEq Oral BID  .  simvastatin  10 mg Oral Daily  . sodium chloride flush  3 mL Intravenous Q12H  . umeclidinium bromide  2 puff Inhalation Daily   Continuous Infusions: . sodium chloride      LOS: 2 days    Kerney Elbe, DO [facility] PAGER is on AMION  If 7PM-7AM, please contact night-coverage [email or web address] Password [facility] 10/07/2018, 12:53 PM

## 2018-10-07 NOTE — Progress Notes (Signed)
SATURATION QUALIFICATIONS: (This note is used to comply with regulatory documentation for home oxygen)  Patient Saturations on Room Air at Rest = 91%    Patient Saturations on Room Air while Ambulating = 89%  Patient Saturations on 0 Liters of oxygen while Ambulating =   Please briefly explain why patient needs home oxygen: Patient came up quickly after dropping to 89% but came back up fast to 90-91% requiring no O2. Once patient sat down O2 92%. Will continue to monitor

## 2018-10-07 NOTE — Clinical Social Work Note (Signed)
Clinical Social Work Assessment  Patient Details  Name: Kathryn Peck MRN: [id number] Date of Birth: 1928-08-07  Date of referral:  10/07/18               Reason for consult:  Facility Placement, Discharge Planning                Permission sought to share information with:  Family Supports, Customer service manager Permission granted to share information::  Yes, Verbal Permission Granted  Name::     Kathryn::  [facility]   Relationship::  Daughter  Contact Information:  home [phone] cell [phone]  Housing/Transportation Living arrangements for the past 2 months:  Assisted Living Facility([facility]) Source of Information:  Patient, Facility Patient Interpreter Needed:  None Criminal Activity/Legal Involvement Pertinent to Current Situation/Hospitalization:  No - Comment as needed Significant Relationships:  Adult Children Lives with:  Facility Resident Do you feel safe going back to the place where you live?  Yes(PT recommending SNF) Need for family participation in patient care:  No (Coment)  Care giving concerns:  Patient admitted from [facility]. Patient has a "medical history significant of CHF, hypertension, obesity, OSA, chronic kidney disease stage IV, who presents to the hospital with chief complaint of worsening shortness of breath as well as worsening bilateral lower extremity swelling over the last couple weeks; admitted for CHF exacerbation." PT recommending SNF.   Social Worker assessment / plan:  CSW spoke with patient at bedside regarding PT recommendation for SNF and discharge planning. Patient declined and reported plan to return to [facility]. Patient reported that she has been at ALF for 3 years and plans to return. Patient granted CSW verbal permission to speak with her daughter to discuss discharge planning further.  CSW contacted [facility] and spoke with staff member Karena Addison who  confirmed patient's ability to return when medically stable. Staff requested that patient return with HHPT,HHOT,HHRN through [facility].   CSW contacted patient's daughter Kathryn Peck [phone]) to discuss patient's discharge plans, no answer. CSW left voicemail requesting return phone call.  Patient plans to return to [facility]. Facility confirmed patient's ability to return. CSW will continue to follow and assist with discharge planning.  Employment status:  Retired Forensic scientist:  Medicare PT Recommendations:  [place] / Referral to community resources:  Other (Comment Required)(Patient admitted from [facility] and declined SNF)  Patient/Family's Response to care:  Patient appreciative of CSW assistance.  Patient/Family's Understanding of and Emotional Response to Diagnosis, Current Treatment, and Prognosis:  Patient presented calm and declined SNF. Patient verbalized plan to return to ALF when medically stable.  Emotional Assessment Appearance:  Appears stated age Attitude/Demeanor/Rapport:  Engaged Affect (typically observed):  Appropriate, Calm Orientation:  Oriented to Self, Oriented to Situation, Oriented to Place, Oriented to  Time Alcohol / Substance use:  Not Applicable Psych involvement (Current and /or in the community):  No (Comment)  Discharge Needs  Concerns to be addressed:  Care Coordination Readmission within the last 30 days:  No Current discharge risk:  Physical Impairment Barriers to Discharge:  Continued Medical Work up   [employer], LCSW 10/07/2018, 3:09 PM

## 2018-10-07 NOTE — Progress Notes (Signed)
Bilateral lower extremity venous duplex has been completed. Negative for DVT.  10/07/18 9:48 AM Kathryn Peck RVT

## 2018-10-07 NOTE — Evaluation (Signed)
Physical Therapy Evaluation Patient Details Name: Kathryn Peck MRN: [id number] DOB: 1928-09-25 Today's Date: 10/07/2018   History of Present Illness  Kathryn Peck is a 84 y.o. female with medical history significant of CHF, hypertension, obesity, OSA, chronic kidney disease stage IV, who presents to the hospital with chief complaint of worsening shortness of breath as well as worsening bilateral lower extremity swelling over the last couple weeks; admitted for CHF exacerbation. Pt is from [facility].  Clinical Impression  Pt admitted with above diagnosis. Pt currently with functional limitations due to the deficits listed below (see PT Problem List).  Pt will benefit from skilled PT to increase their independence and safety with mobility to allow discharge to the venue listed below.   Pt from ALF and wishes to return to ALF despite assist required today.  Pt reports SOB however daughter states she appears at her typical baseline.  SpO2 89-91% on room air throughout session.  Pt reports 3 falls prior to admission and states her legs "give out"  Pt also was not using her brakes with her rollator today without cues so question if a moving rollator could be a contributing factor.  Pt also reports R knee and RN aware.  Would recommend SNF prior to return to ALF however pt feels strongly about returning to ALF.     Follow Up Recommendations SNF    Equipment Recommendations  None recommended by PT    Recommendations for Other Services       Precautions / Restrictions Precautions Precautions: Fall      Mobility  Bed Mobility Overal bed mobility: Needs Assistance Bed Mobility: Supine to Sit     Supine to sit: Min assist     General bed mobility comments: slight assist for scooting to EOB  Transfers Overall transfer level: Needs assistance Equipment used: 4-wheeled walker Transfers: Sit to/from [facility] Sit to Stand: Min assist Stand pivot  transfers: Min assist       General transfer comment: pt required cues to lock brakes for transfers, cues for hand placement, assist to rise and steady; pt first transferred to recliner however then agreeable to ambulate; SPO2 91% room air  Ambulation/Gait Ambulation/Gait assistance: Min assist;+2 safety/equipment Gait Distance (Feet): 16 Feet(total) Assistive device: 4-wheeled walker Gait Pattern/deviations: Step-through pattern;Decreased stride length;Wide base of support     General Gait Details: verbal cues for posture, pt fatigues quickly, 6'x1 seated rest break and then another 10'x1, RN followed with recliner for safety; SPO2 at lowest briefly 89% however mainly 91% on room air  Stairs            Wheelchair Mobility    Modified Rankin (Stroke Patients Only)       Balance                                             Pertinent Vitals/Pain Pain Assessment: 0-10 Pain Score: 2  Pain Location: R knee(pt reports chronic, however also had 3 falls prior to admission) Pain Descriptors / Indicators: Sore Pain Intervention(s): Limited activity within patient's tolerance;Monitored during session;Repositioned(RN aware)    Home Living Family/patient expects to be discharged to:: Assisted living               Home Equipment: Shower seat;Walker - 4 wheels;Transport chair      Prior Function Level of Independence: Needs assistance   Gait /  Transfers Assistance Needed: pt reports using 4WW in apt, but needing w/c to go to and from meals in the facility  ADL's / Homemaking Assistance Needed: pt reports completing all ADLs at baseline, daugther states last week she had to help her shower (she had not in a week due to feeling unsteady)        Hand Dominance        Extremity/Trunk Assessment        Lower Extremity Assessment Lower Extremity Assessment: Generalized weakness       Communication   Communication: HOH  Cognition  Arousal/Alertness: Awake/alert Behavior During Therapy: WFL for tasks assessed/performed Overall Cognitive Status: Within Functional Limits for tasks assessed                                        General Comments      Exercises     Assessment/Plan    PT Assessment Patient needs continued PT services  PT Problem List Decreased strength;Decreased mobility;Decreased activity tolerance;Decreased knowledge of use of DME;Decreased balance       PT Treatment Interventions DME instruction;Therapeutic activities;Therapeutic exercise;Gait training;Patient/family education;Functional mobility training;Balance training    PT Goals (Current goals can be found in the Care Plan section)  Acute Rehab PT Goals PT Goal Formulation: With patient/family Time For Goal Achievement: 10/14/18 Potential to Achieve Goals: Good    Frequency Min 3X/week   Barriers to discharge        Co-evaluation               AM-PAC PT "6 Clicks" Daily Activity  Outcome Measure Difficulty turning over in bed (including adjusting bedclothes, sheets and blankets)?: A Lot Difficulty moving from lying on back to sitting on the side of the bed? : Unable Difficulty sitting down on and standing up from a chair with arms (e.g., wheelchair, bedside commode, etc,.)?: Unable Help needed moving to and from a bed to chair (including a wheelchair)?: A Little Help needed walking in hospital room?: A Little Help needed climbing 3-5 steps with a railing? : A Lot 6 Click Score: 12    End of Session Equipment Utilized During Treatment: Gait belt Activity Tolerance: Patient tolerated treatment well Patient left: in chair;with call bell/phone within reach;with family/visitor present Nurse Communication: Mobility status PT Visit Diagnosis: Other abnormalities of gait and mobility (R26.89)    Time: 7628-3151 PT Time Calculation (min) (ACUTE ONLY): 24 min   Charges:   PT Evaluation $PT Eval Low  Complexity: 1 Low PT Treatments $Gait Training: 8-22 mins       Carmelia Bake, PT, DPT Acute Rehabilitation Services Office: [phone] Pager: [phone] Trena Platt 10/07/2018, 1:09 PM

## 2018-10-08 ENCOUNTER — Inpatient Hospital Stay (HOSPITAL_COMMUNITY): Payer: Medicare Other

## 2018-10-08 LAB — IRON AND TIBC
Iron: 21 ug/dL — ABNORMAL LOW (ref 28–170)
Saturation Ratios: 8 % — ABNORMAL LOW (ref 10.4–31.8)
TIBC: 269 ug/dL (ref 250–450)
UIBC: 248 ug/dL

## 2018-10-08 LAB — CBC WITH DIFFERENTIAL/PLATELET
Abs Immature Granulocytes: 0.06 10*3/uL (ref 0.00–0.07)
Basophils Absolute: 0.1 10*3/uL (ref 0.0–0.1)
Basophils Relative: 1 %
EOS PCT: 3 %
Eosinophils Absolute: 0.3 10*3/uL (ref 0.0–0.5)
HEMATOCRIT: 26.3 % — AB (ref 36.0–46.0)
HEMOGLOBIN: 7.6 g/dL — AB (ref 12.0–15.0)
Immature Granulocytes: 1 %
LYMPHS ABS: 1.1 10*3/uL (ref 0.7–4.0)
LYMPHS PCT: 13 %
MCH: 27.4 pg (ref 26.0–34.0)
MCHC: 28.9 g/dL — AB (ref 30.0–36.0)
MCV: 94.9 fL (ref 80.0–100.0)
MONO ABS: 1.2 10*3/uL — AB (ref 0.1–1.0)
Monocytes Relative: 14 %
Neutro Abs: 6.1 10*3/uL (ref 1.7–7.7)
Neutrophils Relative %: 68 %
Platelets: 287 10*3/uL (ref 150–400)
RBC: 2.77 MIL/uL — ABNORMAL LOW (ref 3.87–5.11)
RDW: 17.6 % — ABNORMAL HIGH (ref 11.5–15.5)
WBC: 8.8 10*3/uL (ref 4.0–10.5)
nRBC: 0.2 % (ref 0.0–0.2)

## 2018-10-08 LAB — COMPREHENSIVE METABOLIC PANEL
ALK PHOS: 46 U/L (ref 38–126)
ALT: 15 U/L (ref 0–44)
ANION GAP: 11 (ref 5–15)
AST: 12 U/L — ABNORMAL LOW (ref 15–41)
Albumin: 3.4 g/dL — ABNORMAL LOW (ref 3.5–5.0)
BILIRUBIN TOTAL: 0.5 mg/dL (ref 0.3–1.2)
BUN: 79 mg/dL — ABNORMAL HIGH (ref 8–23)
CALCIUM: 9 mg/dL (ref 8.9–10.3)
CO2: 22 mmol/L (ref 22–32)
Chloride: 110 mmol/L (ref 98–111)
Creatinine, Ser: 4.12 mg/dL — ABNORMAL HIGH (ref 0.44–1.00)
GFR, EST AFRICAN AMERICAN: 10 mL/min — AB (ref >60)
GFR, EST NON AFRICAN AMERICAN: 9 mL/min — AB (ref >60)
Glucose, Bld: 99 mg/dL (ref 70–99)
Potassium: 3.9 mmol/L (ref 3.5–5.1)
Sodium: 143 mmol/L (ref 135–145)
TOTAL PROTEIN: 6.6 g/dL (ref 6.5–8.1)

## 2018-10-08 LAB — RETICULOCYTES
IMMATURE RETIC FRACT: 20.1 % — AB (ref 2.3–15.9)
RBC.: 2.77 MIL/uL — AB (ref 3.87–5.11)
RETIC CT PCT: 3.1 % (ref 0.4–3.1)
Retic Count, Absolute: 85.3 10*3/uL (ref 19.0–186.0)

## 2018-10-08 LAB — VITAMIN B12: VITAMIN B 12: 1327 pg/mL — AB (ref 180–914)

## 2018-10-08 LAB — PHOSPHORUS: PHOSPHORUS: 5.9 mg/dL — AB (ref 2.5–4.6)

## 2018-10-08 LAB — FOLATE: Folate: 51 ng/mL (ref >5.9)

## 2018-10-08 LAB — FERRITIN: Ferritin: 35 ng/mL (ref 11–307)

## 2018-10-08 LAB — MAGNESIUM: Magnesium: 2.2 mg/dL (ref 1.7–2.4)

## 2018-10-08 MED ORDER — BUMETANIDE 2 MG PO TABS: 2.0000 mg | ORAL_TABLET | Freq: Two times a day (BID) | ORAL | 0 refills | Status: DC

## 2018-10-08 MED ORDER — BUMETANIDE 1 MG PO TABS
2.0000 mg | ORAL_TABLET | Freq: Two times a day (BID) | ORAL | Status: DC
  Administered 2018-10-08: 2 mg via ORAL
  Filled 2018-10-08 (×2): qty 2

## 2018-10-08 MED ORDER — HYDRALAZINE HCL 25 MG PO TABS: 25.0000 mg | ORAL_TABLET | Freq: Three times a day (TID) | ORAL | Status: DC

## 2018-10-08 MED ORDER — GUAIFENESIN ER 600 MG PO TB12: 600.0000 mg | ORAL_TABLET | Freq: Two times a day (BID) | ORAL | 0 refills | Status: AC

## 2018-10-08 MED ORDER — POTASSIUM CHLORIDE CRYS ER 20 MEQ PO TBCR: 20.0000 meq | EXTENDED_RELEASE_TABLET | Freq: Two times a day (BID) | ORAL | Status: DC

## 2018-10-08 MED ORDER — LEVALBUTEROL HCL 0.63 MG/3ML IN NEBU: 0.6300 mg | INHALATION_SOLUTION | Freq: Four times a day (QID) | RESPIRATORY_TRACT | 0 refills | Status: DC | PRN

## 2018-10-08 NOTE — Progress Notes (Signed)
Occupational Therapy Treatment Patient Details Name: Kathryn Peck MRN: [id number] DOB: 03-09-1928 Today's Date: 10/08/2018    History of present illness Kathryn Peck is a 84 y.o. female with medical history significant of CHF, hypertension, obesity, OSA, chronic kidney disease stage IV, who presents to the hospital with chief complaint of worsening shortness of breath as well as worsening bilateral lower extremity swelling over the last couple weeks; admitted for CHF exacerbation. Pt is from [facility].   OT comments  Pt progressing toward stated goals. Focused session on dressing for d/c this date. Bed mobility completed with min A. Sit <> stand completed with min A< functional mobility mod A with 4WW. Pt donned bra and shirt, needing min A for bra and set up A for shirt while seated EOB. LB dressing needing max-total A due to BLE weakness and pain (more assist required for shoes). DOE noted throughout session, pt with productive cough. O2 sats 85 at end of session, improved to 91 with cues to complete pursed lip breathing. Informed RN staff. Further discussed energy conservation for pt when dressing, pt in understanding. Pt up in chair at end of session. Will continue to follow acutely. Continue to recommend SNF, although pt persistent and plans to return to ALF with HHOT.  Follow Up Recommendations  SNF;Other (comment)(pt insistent in returning to ALF with HHOT)    Equipment Recommendations  3 in 1 bedside commode    Recommendations for Other Services      Precautions / Restrictions Precautions Precautions: Fall Restrictions Weight Bearing Restrictions: No       Mobility Bed Mobility Overal bed mobility: Needs Assistance Bed Mobility: Supine to Sit     Supine to sit: Min assist     General bed mobility comments: slight assist for scooting to EOB  Transfers Overall transfer level: Needs assistance Equipment used: 4-wheeled walker Transfers: Sit to/from  [facility] Sit to Stand: Min assist Stand pivot transfers: Mod assist       General transfer comment: cues needed to lock breaks in 4WW and for hand placement with t/f. Pt completing sit <> stands for dressing and transferring to chair at end of session    Balance Overall balance assessment: Needs assistance Sitting-balance support: Bilateral upper extremity supported Sitting balance-Leahy Scale: Fair       Standing balance-Leahy Scale: Poor Standing balance comment: heavy reliance on BUE support                           ADL either performed or assessed with clinical judgement   ADL Overall ADL's : Needs assistance/impaired                 Upper Body Dressing : Minimal assistance;Sitting Upper Body Dressing Details (indicate cue type and reason): to fasten bra (set up A for t shirt) Lower Body Dressing: Maximal assistance;Sit to/from stand;Total assistance Lower Body Dressing Details (indicate cue type and reason): max A to don pants, total A to don shoes. Pt unable to perform figure 4 position for LB dressing at this time             Functional mobility during ADLs: Moderate assistance;Rolling walker General ADL Comments: Focused session on dressing for d/c. Pt engaged in UB dressing with min-set up A, needing increased assist with bra. Max-total for LB dressing, showing LB strength deficits and inability to demonstrate figure 4 position for appropriate dressing. DOE noted with dressing, O2 sats 85 after  session, recovering to 91 with instruction through pursed lip breathing     Vision Baseline Vision/History: Wears glasses Wears Glasses: At all times Patient Visual Report: No change from baseline     Perception     Praxis      Cognition Arousal/Alertness: Awake/alert Behavior During Therapy: WFL for tasks assessed/performed Overall Cognitive Status: Within Functional Limits for tasks assessed                                           Exercises     Shoulder Instructions       General Comments noted BLE edema, slightly improves this date- TED hoes applied by RN staff. Larger socks acquired from previous session.     Pertinent Vitals/ Pain       Pain Assessment: Faces Faces Pain Scale: Hurts little more Pain Location: BLEs  Pain Descriptors / Indicators: Sore Pain Intervention(s): Monitored during session;Limited activity within patient's tolerance;Repositioned  Home Living                                          Prior Functioning/Environment              Frequency  Min 2X/week        Progress Toward Goals  OT Goals(current goals can now be found in the care plan section)  Progress towards OT goals: Progressing toward goals  Acute Rehab OT Goals Patient Stated Goal: to return home OT Goal Formulation: With patient/family Time For Goal Achievement: 10/20/18 Potential to Achieve Goals: Good  Plan Discharge plan remains appropriate;Frequency remains appropriate    Co-evaluation                 AM-PAC PT "6 Clicks" Daily Activity     Outcome Measure   Help from another person eating meals?: A Little Help from another person taking care of personal grooming?: A Little Help from another person toileting, which includes using toliet, bedpan, or urinal?: A Lot Help from another person bathing (including washing, rinsing, drying)?: A Lot Help from another person to put on and taking off regular upper body clothing?: A Little Help from another person to put on and taking off regular lower body clothing?: A Lot 6 Click Score: 15    End of Session Equipment Utilized During Treatment: Rolling walker  OT Visit Diagnosis: Other abnormalities of gait and mobility (R26.89);Muscle weakness (generalized) (M62.81);History of falling (Z91.81)   Activity Tolerance Patient tolerated treatment well   Patient Left in chair;with nursing/sitter in room;with  family/visitor present   Nurse Communication Mobility status(informed that O2 sats reached 85 during func activity)        Time: 9811-9147 OT Time Calculation (min): 22 min  Charges: OT General Charges $OT Visit: 1 Visit OT Treatments $Self Care/Home Management : 8-22 mins  Zenovia Jarred, MSOT, OTR/L [place] OT/ Acute Relief OT Office: [phone]  Zenovia Jarred 10/08/2018, 3:14 PM

## 2018-10-08 NOTE — Care Management Note (Signed)
Case Management Note  Patient Details  Name: Kathryn Peck MRN: [id number] Date of Birth: 1928-08-24  Subjective/Objective: [facility]-Heart failure CHF protocal also.HHRN/PT/OT. No further CM needs.d/c back to ALF.                   Action/Plan:d/c ALF/HHC.   Expected Discharge Date:  10/08/18               Expected Discharge Plan:  Assisted Living / Rest Home  In-House Referral:  Clinical Social Work  Discharge planning Services  CM Consult  Post Acute Care Choice:    Choice offered to:     DME Arranged:    DME Agency:     HH Arranged:  RN, PT, OT, Disease Management [place] Agency:  [facility]  Status of Service:  Completed, signed off  If discussed at [employer], dates discussed:    Additional Comments:  Dessa Phi, RN 10/08/2018, 2:26 PM

## 2018-10-08 NOTE — Progress Notes (Addendum)
Pt returning to [facility]- report [phone] Provided DC summary and FL2- coordinated with Dee at [facility]. Will arrange PTAR transportation.  15:38 update- ALF confirmed receipt of FL2 and DC summary. Arranged PTAR transportation- PTAR advised several patients awaiting transfer, to anticipate arrival may be delayed.  Sharren Bridge, MSW, LCSW Clinical Social Work 10/08/2018 [phone]- coverage for [phone]

## 2018-10-08 NOTE — Discharge Summary (Signed)
Physician Discharge Summary  Kathryn Peck DTO:[id number] DOB: 26-Dec-1927 DOA: 10/04/2018  PCP: Aretta Nip, MD  Admit date: 10/04/2018 Discharge date: 10/08/2018  Admitted From: ALF Disposition: ALF with Home Health PT/OT/RN as refused SNF  Recommendations for Outpatient Follow-up:  1. Follow up with PCP in 1-2 weeks 2. Follow-up with cardiology Dr. Debara Pickett within 1 to 2 weeks 3. Along with nephrology Dr. Posey Pronto in 1 to 2 weeks 4. Follow-up with pulmonary and keep regularly scheduled pulmonary appointment 5. Please obtain CMP/CBC, Mag, Phos in one week 6. Please follow up on the following pending results:  Home Health: Yes Equipment/Devices: None recommended by PT    Discharge Condition: Stable CODE STATUS: DO NOT RESUSCITATE Diet recommendation: Heart Healthy 2 Gram Sodium with 1200 mL Fluid Restriction   Brief/Interim Summary: Kathryn Phetteplaceis a 84 y.o.femalewith medical history significant ofCHF, hypertension, obesity, OSA, chronic kidney disease stage IV, who presented to the hospital with chief complaint of worsening shortness of breath as well as worsening bilateral lower extremity swelling over the last couple weeks. She states that when she saw her pulmonologist about 10 days ago her weight was 193 pounds, prior to that at [facility] where she resides she was at 181. She does not weigh herself daily. She reports compliance with a low-salt diet as much as she can but she eats at her facility. She denied any chest pain, denies any abdominal pain, nausea vomiting. She reported intermittent diarrhea which is chronic for her. She reports good urine output on home Bumex daily.  **She was worked up and found to have acute on chronic grade 2 diastolic CHF exacerbation is currently being diuresed aggressively.  She improved and was weaned off of oxygen and she was transitioned to p.o. Diuretics.  She did well and was deemed medically stable to be discharged and  will need to follow with PCP, cardiology, pulmonology, nephrology in outpatient setting.  She will need to follow-up with a heart failure clinic for further diuresis management and titration of medications.  Discharge Diagnoses:  Principal Problem:   CHF exacerbation ([place]) Active Problems:   OSA (obstructive sleep apnea)   Essential hypertension   COPD with chronic bronchitis (HCC)   Obesity (BMI 30-39.9)   CKD (chronic kidney disease) stage 5, GFR less than 15 ml/min (HCC)   Hypervolemia   Acute diastolic CHF (congestive heart failure) (HCC)  Acute on chronic grade 2 diastolic CHF exacerbation with preserved ejection fraction of 55 to 60% -BNP was elevated at 1703 and trended down slightly to 1597 -Patient takes Bumex at home but drinks excessive fluids on history likely making it difficult diuresis properly at home -Strict I's and O's, daily weights, saline lock IV and fluid restrict to 1200 and mils per day -Patient is -3,150 mL's and weight is down 7 pounds since admission and down total of 9 pounds from highest weight on 1022 -Continued with IV diuresis with furosemide 80 mg every 6 and changed to p.o. Bumex 2 mg p.o. twice daily -Continue monitor volume status very carefully -Continue with Hydralazine 25 mg p.o. every 8 hours, Isosorbide mononitrate 30 mg p.o. daily, Metoprolol Succinate 50 mg p.o. Daily -Did ambulatory screen to check for oxygen and patient did not desaturate and she was on 1 L of oxygen and was weaned off -Due to bilateral lower extremity venous duplex to rule out DVT and is negative for DVT -Patient was provided potassium supplementation with p.o. potassium chloride 20 mEq twice daily as long as she being  diuresed with Bumex -Follow-up with PCP and cardiology in outpatient setting.  Patient follows up with Dr. Wynonia Lawman -Follow-up with the Heart Failure Clinic and continue daily weights and fluid restriction -Continue with TED Hose  Mildly elevated troponin  -Has  a flat appearing trend likely due to cardiac strain in the setting of chronic kidney disease -Troponin level went from 0.04 -> 0.03 -> 0.04 -> 0.07 -Patient denies any active chest pain -Continue telemetry monitoring while hospitalized  Recurrent falls -PT evaluation done and patient is recommended SNF but she does not want to go to SNF and has elected to go to the assisted living facility with home health PT/OT and nurse  CKD stage IV/V -Creatinine is around baseline (4.00) and is 4.12 today -We will need to watch creatinine very closely especially that she was being diuresed with IV Lasix and now being changed to p.o. Bumex -Avoid nephrotoxic medications if possible  -Continue to monitor renal function and trend and repeat CMP as an outpatient -Follow-up with nephrology Dr. Posey Pronto within 1 week  Essential Hypertension -Continue with Amlodipine 10 mg p.o. daily as well as Metoprolol Succinate 50 mg p.o. daily -Continue with Hydralazine 25 mg p.o. 3 times daily for now -Continued with IV diuresis with Lasix 80 mg every 6h scheduled and changed to Bumex 2 mg p.o. twice daily  Anemia of chronic kidney disease -Patient's hemoglobin/hematocrit went from 8.2/28.0 and is now 7.6/26.3 -No signs or symptoms of bleeding currently -Checked Anemia panel and showed iron level 21, U IBC of 248, TIBC 269, saturation ratios of 8%, ferritin level 35, folate level 51.0, and vitamin B12 level 1327 -Continue to monitor for signs and symptoms of bleeding -Repeat CBC within 1 week and follow-up with Nephrology  Hyperphosphatemia -Likely in the setting of renal dysfunction -Patient's phosphorus level 5.9 this morning -Continue monitor and trend phos Level as an outpatient    Non-gap metabolic acidosis -In The setting of chronic kidney disease -Patient's CO2 is 22 -Repeat CMP within 1 week   Obesity -Estimated body mass index is 34.01 kg/m as calculated from the following:   Height as of this  encounter: 5\' 3"  (1.6 m).   Weight as of this encounter: 87.1 kg.  -Weight Loss Counseling Given  OSA -C/w Nocturnal CPAP  COPD with chronic bronchitis -Started levalbuterol every 6 scheduled but will continue PRN at SNF -Continue with Incruse Ellipta substitution but resume Home Spiriva at D/C -Was given guaifenesin for 7 days   Hyperlipidemia -Continue home Simvastatin 10 mg po Daily   Right Knee Pain -Patient has fallen multiple times in the last few weeks -Checked X-Ray of the Knee and showed Tricompartmental degenerative changes are noted most marked in the medial joint space. No joint effusion is seen. No acute fracture or dislocation is noted.  Aortic Stenosis -Moderate on ECHO -Follow up with Cardiology as an outpatient   Discharge Instructions Discharge Instructions    ([place]) Call MD:  Anytime you have any of the following symptoms: 1) 3 pound weight gain in 24 hours or 5 pounds in 1 week 2) shortness of breath, with or without a dry hacking cough 3) swelling in the hands, feet or stomach 4) if you have to sleep on extra pillows at night in order to breathe.   Complete by:  As directed    AMB referral to CHF clinic   Complete by:  As directed    Call MD for:  difficulty breathing, headache or visual disturbances  Complete by:  As directed    Call MD for:  extreme fatigue   Complete by:  As directed    Call MD for:  hives   Complete by:  As directed    Call MD for:  persistant dizziness or light-headedness   Complete by:  As directed    Call MD for:  persistant nausea and vomiting   Complete by:  As directed    Call MD for:  redness, tenderness, or signs of infection (pain, swelling, redness, odor or green/yellow discharge around incision site)   Complete by:  As directed    Call MD for:  severe uncontrolled pain   Complete by:  As directed    Call MD for:  temperature >100.4   Complete by:  As directed    Diet - low sodium heart healthy    Complete by:  As directed    Discharge instructions   Complete by:  As directed    You were cared for by a hospitalist during your hospital stay. If you have any questions about your discharge medications or the care you received while you were in the hospital after you are discharged, you can call the unit and ask to speak with the hospitalist on call if the hospitalist that took care of you is not available. Once you are discharged, your primary care physician will handle any further medical issues. Please note that NO REFILLS for any discharge medications will be authorized once you are discharged, as it is imperative that you return to your primary care physician (or establish a relationship with a primary care physician if you do not have one) for your aftercare needs so that they can reassess your need for medications and monitor your lab values.  Follow up with PCP, Cardiology, Pulmonary and Nephrology as an outpatient. Take all medications as prescribed. If symptoms change or worsen please return to the ED for evaluation   Increase activity slowly   Complete by:  As directed      Allergies as of 10/08/2018      Reactions   Latex Itching   Adhesive [tape] Rash   Influenza Vaccines Nausea And Vomiting   Vaccine made her "deathly sick".  She does not take them at all   Zetia [ezetimibe] Other (See Comments)   Unknown      Medication List    STOP taking these medications   azithromycin 250 MG tablet Commonly known as:  ZITHROMAX     TAKE these medications   acetaminophen 325 MG tablet Commonly known as:  TYLENOL Take 650 mg by mouth 2 (two) times daily.   amLODipine 10 MG tablet Commonly known as:  NORVASC Take 10 mg by mouth daily.   aspirin 81 MG chewable tablet Chew 81 mg by mouth daily.   bumetanide 2 MG tablet Commonly known as:  BUMEX Take 1 tablet (2 mg total) by mouth 2 (two) times daily. What changed:    medication strength  how much to take  when to take  this   CENTRUM SILVER ULTRA WOMENS PO Take 1 tablet by mouth daily.   colesevelam 625 MG tablet Commonly known as:  WELCHOL Take 625 mg by mouth 2 (two) times daily as needed.   guaiFENesin 600 MG 12 hr tablet Commonly known as:  MUCINEX Take 1 tablet (600 mg total) by mouth 2 (two) times daily for 7 days.   hydrALAZINE 25 MG tablet Commonly known as:  APRESOLINE Take 1 tablet (25  mg total) by mouth every 8 (eight) hours.   isosorbide mononitrate 30 MG 24 hr tablet Commonly known as:  IMDUR Take 30 mg by mouth daily.   latanoprost 0.005 % ophthalmic solution Commonly known as:  XALATAN Place 1 drop into both eyes at bedtime.   levalbuterol 0.63 MG/3ML nebulizer solution Commonly known as:  XOPENEX Take 3 mLs (0.63 mg total) by nebulization every 6 (six) hours as needed for wheezing or shortness of breath.   loperamide 2 MG tablet Commonly known as:  IMODIUM A-D Take 2 mg by mouth 2 (two) times daily as needed for diarrhea or loose stools.   metoprolol succinate 50 MG 24 hr tablet Commonly known as:  TOPROL-XL Take 50 mg by mouth daily. Take with or immediately following a meal.   OSTEO BI-FLEX JOINT SHIELD PO Take 1 tablet by mouth 2 (two) times daily.   potassium chloride SA 20 MEQ tablet Commonly known as:  K-DUR,KLOR-CON Take 1 tablet (20 mEq total) by mouth 2 (two) times daily. What changed:  when to take this   PROBIOTIC COLON SUPPORT PO Take 1 capsule by mouth daily.   simvastatin 10 MG tablet Commonly known as:  ZOCOR Take 10 mg by mouth at bedtime.   Tiotropium Bromide Monohydrate 2.5 MCG/ACT Aers Inhale 2 puffs into the lungs daily.   Vitamin D (Ergocalciferol) 50000 units Caps capsule Commonly known as:  DRISDOL Take 50,000 Units by mouth every 7 (seven) days. On Thursdays       Allergies  Allergen Reactions  . Latex Itching  . Adhesive [Tape] Rash  . Influenza Vaccines Nausea And Vomiting    Vaccine made her "deathly sick".  She does not  take them at all  . Zetia [Ezetimibe] Other (See Comments)    Unknown   Consultations:  None  Procedures/Studies: Dg Chest 2 View  Result Date: 10/04/2018 CLINICAL DATA:  Shortness of breath. EXAM: CHEST - 2 VIEW COMPARISON:  None. FINDINGS: Borderline cardiomegaly considering the AP technique. Aortic atherosclerosis. The main pulmonary artery is prominent. Dense calcification in the mitral valve annulus. No infiltrates or effusions. No acute bone abnormality. IMPRESSION: Borderline heart size.  Prominent main pulmonary artery. Aortic Atherosclerosis (ICD10-I70.0). Electronically Signed   By: Lorriane Shire M.D.   On: 10/04/2018 12:48   Dg Knee 1-2 Views Right  Result Date: 10/07/2018 CLINICAL DATA:  Diffuse right knee pain while walking EXAM: RIGHT KNEE - 1-2 VIEW COMPARISON:  None. FINDINGS: Tricompartmental degenerative changes are noted most marked in the medial joint space. No joint effusion is seen. No acute fracture or dislocation is noted. IMPRESSION: Degenerative change without acute abnormality. Electronically Signed   By: Inez Catalina M.D.   On: 10/07/2018 14:58   Dg Chest Port 1 View  Result Date: 10/08/2018 CLINICAL DATA:  Shortness of breath. EXAM: PORTABLE CHEST 1 VIEW COMPARISON:  10/04/2018 FINDINGS: Lungs are adequately inflated with mild hazy opacification over the right base which may be due to atelectasis or infection. No effusion. Prominence of the right hilum and prominent main pulmonary artery segment. Cardiomediastinal silhouette and remainder of the exam is unchanged. IMPRESSION: Mild hazy right base opacification which may be due to atelectasis or infection. Prominent main pulmonary artery segment which may be due to pulmonary arterial hypertension. Prominent right hilum which also may be part of pulmonary arterial hypertension, although mass or adenopathy is possible. Recommend contrast and chest CT on elective basis for further evaluation. Electronically Signed    By: Marin Olp M.D.  On: 10/08/2018 09:56   ECHOCARDIOGRAM 10/05/18 ------------------------------------------------------------------- Study Conclusions  - Left ventricle: The cavity size was normal. There was mild   concentric hypertrophy. Systolic function was normal. The   estimated ejection fraction was in the range of 55% to 60%. Wall   motion was normal; there were no regional wall motion   abnormalities. Features are consistent with a pseudonormal left   ventricular filling pattern, with concomitant abnormal relaxation   and increased filling pressure (grade 2 diastolic dysfunction). - Aortic valve: Cusp separation was moderately reduced. Noncoronary   cusp mobility was severely restricted. There was moderate   stenosis. There was trivial regurgitation. Valve area (VTI): 1.13   cm^2. Valve area (Vmax): 1.31 cm^2. Valve area (Vmean): 1.21   cm^2. - Mitral valve: Severely calcified annulus. Mildly thickened   leaflets . The findings are consistent with mild stenosis. There   was moderate regurgitation directed eccentrically and   posteriorly. Mean gradient (D): 6 mm Hg. Gradients at 60 bpm.   Valve area by continuity equation (using LVOT flow): 1.39 cm^2. - Left atrium: The atrium was severely dilated. - Right ventricle: The cavity size was mildly dilated. - Right atrium: The atrium was moderately dilated. - Tricuspid valve: There was mild-moderate regurgitation directed   centrally. - Pulmonary arteries: Systolic pressure was moderately increased.   PA peak pressure: 62 mm Hg (S).  Subjective: Seen and examined at bedside and patient was doing well.  She had been weaned off of oxygen and states that she felt well.  No chest pain, lightheadedness or dizziness.  States that she normally wheezes in the morning had some mild wheezing this morning but improved.  No nausea or vomiting.  No other concerns or complaints at this time and wanted to go back to the ALF and refusing  skilled nursing facility  Discharge Exam: Vitals:   10/08/18 0529 10/08/18 0720  BP: (!) 158/61   Pulse: 62   Resp: 16   Temp: 98.7 F (37.1 C)   SpO2: 95% 94%   Vitals:   10/07/18 2310 10/08/18 0524 10/08/18 0529 10/08/18 0720  BP: (!) 147/53  (!) 158/61   Pulse: 66 60 62   Resp: 14  16   Temp:   98.7 F (37.1 C)   TempSrc:   Oral   SpO2: 94% 96% 95% 94%  Weight:   84.7 kg   Height:       General: Pt is alert, awake, not in acute distress Cardiovascular: RRR, S1/S2 +, no rubs, no gallops Respiratory: Diminished slightly bilaterally with mild wheezing, no rhonchi; Unlabored Breathing and not using any supplemental O2 via [place] Abdominal: Soft, NT, Distended due to body habitus, bowel sounds + Extremities: 1+ LE edema but has compression stockings on, no cyanosis  The results of significant diagnostics from this hospitalization (including imaging, microbiology, ancillary and laboratory) are listed below for reference.    Microbiology: No results found for this or any previous visit (from the past 240 hour(s)).   Labs: BNP (last 3 results) Recent Labs    10/04/18 1256 10/06/18 0544  BNP 1,703.6* 0,947.0*   Basic Metabolic Panel: Recent Labs  Lab 10/04/18 1256 10/05/18 0356 10/06/18 0544 10/07/18 0516 10/08/18 0517  NA 142 142 142 143 143  K 4.9 4.3 3.6 3.5 3.9  CL 113* 113* 111 111 110  CO2 17* 16* 19* 21* 22  GLUCOSE 103* 83 99 111* 99  BUN 74* 75* 77* 74* 79*  CREATININE 4.23* 4.13* 4.33* 4.24*  4.12*  CALCIUM 9.3 9.0 8.9 9.1 9.0  MG  --   --  2.4 2.4 2.2  PHOS  --   --  6.4* 6.5* 5.9*   Liver Function Tests: Recent Labs  Lab 10/07/18 0516 10/08/18 0517  AST 13* 12*  ALT 17 15  ALKPHOS 43 46  BILITOT 0.6 0.5  PROT 6.9 6.6  ALBUMIN 3.4* 3.4*   No results for input(s): LIPASE, AMYLASE in the last 168 hours. No results for input(s): AMMONIA in the last 168 hours. CBC: Recent Labs  Lab 10/04/18 1256 10/06/18 0544 10/07/18 0516 10/08/18 0517   WBC 9.4 7.5 7.8 8.8  NEUTROABS  --  4.9 5.3 6.1  HGB 8.2* 7.9* 7.8* 7.6*  HCT 28.0* 26.3* 26.7* 26.3*  MCV 94.9 93.9 94.7 94.9  PLT 318 299 298 287   Cardiac Enzymes: Recent Labs  Lab 10/04/18 1256 10/04/18 1637 10/04/18 2200 10/05/18 0356  TROPONINI 0.04* 0.03* 0.04* 0.07*   BNP: Invalid input(s): POCBNP CBG: No results for input(s): GLUCAP in the last 168 hours. D-Dimer No results for input(s): DDIMER in the last 72 hours. Hgb A1c No results for input(s): HGBA1C in the last 72 hours. Lipid Profile No results for input(s): CHOL, HDL, LDLCALC, TRIG, CHOLHDL, LDLDIRECT in the last 72 hours. Thyroid function studies No results for input(s): TSH, T4TOTAL, T3FREE, THYROIDAB in the last 72 hours.  Invalid input(s): FREET3 Anemia work up Recent Labs    10/08/18 0517  VITAMINB12 1,327*  FOLATE 51.0  FERRITIN 35  TIBC 269  IRON 21*  RETICCTPCT 3.1   Urinalysis    Component Value Date/Time   COLORURINE YELLOW 10/04/2018 [place] 10/04/2018 1742   LABSPEC 1.011 10/04/2018 1742   PHURINE 5.0 10/04/2018 1742   GLUCOSEU NEGATIVE 10/04/2018 1742   HGBUR NEGATIVE 10/04/2018 1742   BILIRUBINUR NEGATIVE 10/04/2018 1742   KETONESUR NEGATIVE 10/04/2018 1742   PROTEINUR 30 (A) 10/04/2018 1742   NITRITE NEGATIVE 10/04/2018 1742   LEUKOCYTESUR NEGATIVE 10/04/2018 1742   Sepsis Labs Invalid input(s): PROCALCITONIN,  WBC,  LACTICIDVEN Microbiology No results found for this or any previous visit (from the past 240 hour(s)).  Time coordinating discharge: 35 minutes  SIGNED:  Kerney Elbe, DO [facility] 10/08/2018, 11:20 AM Pager is on [place]  If 7PM-7AM, please contact night-coverage [email or web address] Password [email or web address]

## 2018-10-08 NOTE — NC FL2 (Addendum)
[place] LEVEL OF CARE SCREENING TOOL     IDENTIFICATION  Patient Name: Kathryn Peck Birthdate: Jun 03, 1928 Sex: female Admission Date (Current Location): 10/04/2018  [facility] and [place] Number:  Herbalist and Address:  [facility],  [place], [place]      Provider Number: [id number]  Attending Physician Name and Address:  Kerney Elbe, DO  Relative Name and Phone Number:  Deretha Emory: [phone]    Current Level of Care: Hospital Recommended Level of Care: [place] Prior Approval Number:    Date Approved/Denied:   PASRR Number:    Discharge Plan: Other (Comment)(ALF)    Current Diagnoses: Patient Active Problem List   Diagnosis Date Noted  . CKD (chronic kidney disease) stage 5, GFR less than 15 ml/min (HCC) 10/05/2018  . Hypervolemia   . Acute systolic CHF (congestive heart failure) ([place])   . Acute diastolic CHF (congestive heart failure) ([place])   . CHF exacerbation ([place]) 10/04/2018  . Essential hypertension 10/04/2018  . COPD with chronic bronchitis ([place]) 10/04/2018  . Obesity (BMI 30-39.9) 10/04/2018  . OSA (obstructive sleep apnea) 09/24/2018  . COPD with acute exacerbation ([place]) 09/24/2018    Orientation RESPIRATION BLADDER Height & Weight     Self, Time, Situation, Place  Normal External catheter Weight: 186 lb 11.7 oz (84.7 kg) Height:  5\' 3"  (160 cm)  BEHAVIORAL SYMPTOMS/MOOD NEUROLOGICAL BOWEL NUTRITION STATUS      Continent Diet(no added salt)  AMBULATORY STATUS COMMUNICATION OF NEEDS Skin   Extensive Assist Verbally Normal                       Personal Care Assistance Level of Assistance  Bathing, Feeding, Dressing Bathing Assistance: Maximum assistance Feeding assistance: Limited assistance Dressing Assistance: Maximum assistance     Functional Limitations Info  Sight, Hearing, Speech Sight Info: Adequate Hearing Info: Adequate Speech Info: Adequate     SPECIAL CARE FACTORS FREQUENCY  PT (By licensed PT), OT (By licensed OT)     PT Frequency: HH PT  OT Frequency: HH OT            Contractures Contractures Info: Not present    Additional Factors Info  Code Status, Allergies Code Status Info: DNR Allergies Info: LATEX, ADHESIVE TAPE, INFLUENZA VACCINES, ZETIA EZETIMIBE            Current Medications (10/08/2018):  This is the current hospital active medication list Current Facility-Administered Medications  Medication Dose Route Frequency Provider Last Rate Last Dose  . 0.9 %  sodium chloride infusion  250 mL Intravenous PRN Caren Griffins, MD      . acetaminophen (TYLENOL) tablet 650 mg  650 mg Oral Q6H PRN Caren Griffins, MD      . amLODipine (NORVASC) tablet 10 mg  10 mg Oral Daily Debbe Odea, MD   10 mg at 10/08/18 1057  . aspirin EC tablet 81 mg  81 mg Oral Daily Caren Griffins, MD   81 mg at 10/08/18 1057  . bumetanide (BUMEX) tablet 2 mg  2 mg Oral BID Raiford Noble [place], DO   2 mg at 10/08/18 1056  . heparin injection 5,000 Units  5,000 Units Subcutaneous Q8H Caren Griffins, MD   5,000 Units at 10/08/18 1305  . hydrALAZINE (APRESOLINE) injection 5 mg  5 mg Intravenous Q4H PRN Caren Griffins, MD   5 mg at 10/06/18 1045  . hydrALAZINE (APRESOLINE) tablet  25 mg  25 mg Oral Q8H Debbe Odea, MD   25 mg at 10/08/18 1305  . isosorbide mononitrate (IMDUR) 24 hr tablet 30 mg  30 mg Oral Daily Caren Griffins, MD   30 mg at 10/08/18 1057  . latanoprost (XALATAN) 0.005 % ophthalmic solution 1 drop  1 drop Both Eyes QHS Caren Griffins, MD   1 drop at 10/07/18 2121  . levalbuterol (XOPENEX) nebulizer solution 0.63 mg  0.63 mg Nebulization Q6H Sheikh, Georgina Quint [place], DO   0.63 mg at 10/08/18 0719  . metoprolol succinate (TOPROL-XL) 24 hr tablet 50 mg  50 mg Oral Daily Caren Griffins, MD   50 mg at 10/08/18 1057  . ondansetron (ZOFRAN) injection 4 mg  4 mg Intravenous Q6H PRN Caren Griffins, MD      .  potassium chloride SA (K-DUR,KLOR-CON) CR tablet 40 mEq  40 mEq Oral BID Sheikh, Omair Latif, DO   40 mEq at 10/08/18 1057  . simvastatin (ZOCOR) tablet 10 mg  10 mg Oral Daily Caren Griffins, MD   10 mg at 10/08/18 1057  . sodium chloride flush (NS) 0.9 % injection 3 mL  3 mL Intravenous Q12H Caren Griffins, MD   3 mL at 10/08/18 1058  . sodium chloride flush (NS) 0.9 % injection 3 mL  3 mL Intravenous PRN Caren Griffins, MD      . umeclidinium bromide (INCRUSE ELLIPTA) 62.5 MCG/INH 2 puff  2 puff Inhalation Daily Caren Griffins, MD   2 puff at 10/08/18 0719     Discharge Medications: acetaminophen 325 MG tablet Commonly known as:  TYLENOL Take 650 mg by mouth 2 (two) times daily.   amLODipine 10 MG tablet Commonly known as:  NORVASC Take 10 mg by mouth daily.   aspirin 81 MG chewable tablet Chew 81 mg by mouth daily.   bumetanide 2 MG tablet Commonly known as:  BUMEX Take 1 tablet (2 mg total) by mouth 2 (two) times daily. What changed:    medication strength  how much to take  when to take this   CENTRUM SILVER ULTRA WOMENS PO Take 1 tablet by mouth daily.   colesevelam 625 MG tablet Commonly known as:  WELCHOL Take 625 mg by mouth 2 (two) times daily as needed.   guaiFENesin 600 MG 12 hr tablet Commonly known as:  MUCINEX Take 1 tablet (600 mg total) by mouth 2 (two) times daily for 7 days.   hydrALAZINE 25 MG tablet Commonly known as:  APRESOLINE Take 1 tablet (25 mg total) by mouth every 8 (eight) hours.   isosorbide mononitrate 30 MG 24 hr tablet Commonly known as:  IMDUR Take 30 mg by mouth daily.   latanoprost 0.005 % ophthalmic solution Commonly known as:  XALATAN Place 1 drop into both eyes at bedtime.   levalbuterol 0.63 MG/3ML nebulizer solution Commonly known as:  XOPENEX Take 3 mLs (0.63 mg total) by nebulization every 6 (six) hours as needed for wheezing or shortness of breath.   loperamide 2 MG tablet Commonly known  as:  IMODIUM A-D Take 2 mg by mouth 2 (two) times daily as needed for diarrhea or loose stools.   metoprolol succinate 50 MG 24 hr tablet Commonly known as:  TOPROL-XL Take 50 mg by mouth daily. Take with or immediately following a meal.   OSTEO BI-FLEX JOINT SHIELD PO Take 1 tablet by mouth 2 (two) times daily.   potassium chloride SA 20 MEQ tablet  Commonly known as:  K-DUR,KLOR-CON Take 1 tablet (20 mEq total) by mouth 2 (two) times daily. What changed:  when to take this   PROBIOTIC COLON SUPPORT PO Take 1 capsule by mouth daily.   simvastatin 10 MG tablet Commonly known as:  ZOCOR Take 10 mg by mouth at bedtime.   Tiotropium Bromide Monohydrate 2.5 MCG/ACT Aers Inhale 2 puffs into the lungs daily.   Vitamin D (Ergocalciferol) 50000 units Caps capsule Commonly known as:  DRISDOL Take 50,000 Units by mouth every 7 (seven) days. On Thursdays     Relevant Imaging Results:  Relevant Lab Results:   Additional Information SSN: [id number]  Pricilla Holm, [place]

## 2018-10-11 ENCOUNTER — Encounter (HOSPITAL_COMMUNITY): Payer: Self-pay

## 2018-10-11 ENCOUNTER — Other Ambulatory Visit: Payer: Self-pay

## 2018-10-11 ENCOUNTER — Telehealth: Payer: Self-pay | Admitting: *Deleted

## 2018-10-11 ENCOUNTER — Inpatient Hospital Stay (HOSPITAL_COMMUNITY)
Admission: EM | Admit: 2018-10-11 | Discharge: 2018-10-15 | DRG: 291 | Disposition: A | Discharge status: Home Health | Payer: Medicare Other | Attending: Family Medicine | Admitting: Family Medicine

## 2018-10-11 ENCOUNTER — Emergency Department (HOSPITAL_COMMUNITY): Payer: Medicare Other

## 2018-10-11 DIAGNOSIS — E669 Obesity, unspecified: Secondary | ICD-10-CM | POA: Diagnosis present

## 2018-10-11 DIAGNOSIS — Z888 Allergy status to other drugs, medicaments and biological substances status: Secondary | ICD-10-CM

## 2018-10-11 DIAGNOSIS — Z887 Allergy status to serum and vaccine status: Secondary | ICD-10-CM

## 2018-10-11 DIAGNOSIS — N185 Chronic kidney disease, stage 5: Secondary | ICD-10-CM | POA: Diagnosis present

## 2018-10-11 DIAGNOSIS — I10 Essential (primary) hypertension: Secondary | ICD-10-CM | POA: Diagnosis not present

## 2018-10-11 DIAGNOSIS — R0981 Nasal congestion: Secondary | ICD-10-CM | POA: Diagnosis not present

## 2018-10-11 DIAGNOSIS — G4733 Obstructive sleep apnea (adult) (pediatric): Secondary | ICD-10-CM | POA: Diagnosis present

## 2018-10-11 DIAGNOSIS — I272 Pulmonary hypertension, unspecified: Secondary | ICD-10-CM | POA: Diagnosis present

## 2018-10-11 DIAGNOSIS — I5033 Acute on chronic diastolic (congestive) heart failure: Secondary | ICD-10-CM | POA: Diagnosis present

## 2018-10-11 DIAGNOSIS — J441 Chronic obstructive pulmonary disease with (acute) exacerbation: Secondary | ICD-10-CM | POA: Diagnosis present

## 2018-10-11 DIAGNOSIS — I120 Hypertensive chronic kidney disease with stage 5 chronic kidney disease or end stage renal disease: Secondary | ICD-10-CM | POA: Diagnosis not present

## 2018-10-11 DIAGNOSIS — Z66 Do not resuscitate: Secondary | ICD-10-CM | POA: Diagnosis present

## 2018-10-11 DIAGNOSIS — Z91048 Other nonmedicinal substance allergy status: Secondary | ICD-10-CM

## 2018-10-11 DIAGNOSIS — N179 Acute kidney failure, unspecified: Secondary | ICD-10-CM | POA: Diagnosis present

## 2018-10-11 DIAGNOSIS — E872 Acidosis: Secondary | ICD-10-CM | POA: Diagnosis present

## 2018-10-11 DIAGNOSIS — E785 Hyperlipidemia, unspecified: Secondary | ICD-10-CM | POA: Diagnosis present

## 2018-10-11 DIAGNOSIS — Z91040 Latex allergy status: Secondary | ICD-10-CM

## 2018-10-11 DIAGNOSIS — I5031 Acute diastolic (congestive) heart failure: Secondary | ICD-10-CM | POA: Diagnosis not present

## 2018-10-11 DIAGNOSIS — E875 Hyperkalemia: Secondary | ICD-10-CM | POA: Diagnosis present

## 2018-10-11 DIAGNOSIS — J449 Chronic obstructive pulmonary disease, unspecified: Secondary | ICD-10-CM | POA: Diagnosis not present

## 2018-10-11 DIAGNOSIS — I110 Hypertensive heart disease with heart failure: Secondary | ICD-10-CM | POA: Diagnosis not present

## 2018-10-11 DIAGNOSIS — E1122 Type 2 diabetes mellitus with diabetic chronic kidney disease: Secondary | ICD-10-CM | POA: Diagnosis present

## 2018-10-11 DIAGNOSIS — I248 Other forms of acute ischemic heart disease: Secondary | ICD-10-CM | POA: Diagnosis present

## 2018-10-11 DIAGNOSIS — I509 Heart failure, unspecified: Secondary | ICD-10-CM | POA: Diagnosis not present

## 2018-10-11 DIAGNOSIS — D509 Iron deficiency anemia, unspecified: Secondary | ICD-10-CM | POA: Diagnosis present

## 2018-10-11 DIAGNOSIS — J9601 Acute respiratory failure with hypoxia: Secondary | ICD-10-CM | POA: Diagnosis present

## 2018-10-11 DIAGNOSIS — Z7982 Long term (current) use of aspirin: Secondary | ICD-10-CM | POA: Diagnosis not present

## 2018-10-11 DIAGNOSIS — Z79899 Other long term (current) drug therapy: Secondary | ICD-10-CM | POA: Diagnosis not present

## 2018-10-11 DIAGNOSIS — I132 Hypertensive heart and chronic kidney disease with heart failure and with stage 5 chronic kidney disease, or end stage renal disease: Secondary | ICD-10-CM | POA: Diagnosis not present

## 2018-10-11 DIAGNOSIS — R0902 Hypoxemia: Secondary | ICD-10-CM

## 2018-10-11 DIAGNOSIS — D631 Anemia in chronic kidney disease: Secondary | ICD-10-CM | POA: Diagnosis not present

## 2018-10-11 DIAGNOSIS — Z881 Allergy status to other antibiotic agents status: Secondary | ICD-10-CM

## 2018-10-11 DIAGNOSIS — Z91018 Allergy to other foods: Secondary | ICD-10-CM

## 2018-10-11 DIAGNOSIS — I080 Rheumatic disorders of both mitral and aortic valves: Secondary | ICD-10-CM | POA: Diagnosis present

## 2018-10-11 DIAGNOSIS — J90 Pleural effusion, not elsewhere classified: Secondary | ICD-10-CM | POA: Diagnosis not present

## 2018-10-11 DIAGNOSIS — D638 Anemia in other chronic diseases classified elsewhere: Secondary | ICD-10-CM | POA: Diagnosis present

## 2018-10-11 HISTORY — DX: Heart failure, unspecified: I50.9

## 2018-10-11 LAB — CBC WITH DIFFERENTIAL/PLATELET
Abs Immature Granulocytes: 0.04 10*3/uL (ref 0.00–0.07)
BASOS ABS: 0.1 10*3/uL (ref 0.0–0.1)
Basophils Relative: 1 %
Eosinophils Absolute: 0.2 10*3/uL (ref 0.0–0.5)
Eosinophils Relative: 2 %
HEMATOCRIT: 27.6 % — AB (ref 36.0–46.0)
Hemoglobin: 8.1 g/dL — ABNORMAL LOW (ref 12.0–15.0)
IMMATURE GRANULOCYTES: 0 %
LYMPHS ABS: 1.3 10*3/uL (ref 0.7–4.0)
LYMPHS PCT: 14 %
MCH: 28.2 pg (ref 26.0–34.0)
MCHC: 29.3 g/dL — ABNORMAL LOW (ref 30.0–36.0)
MCV: 96.2 fL (ref 80.0–100.0)
Monocytes Absolute: 0.9 10*3/uL (ref 0.1–1.0)
Monocytes Relative: 10 %
NEUTROS PCT: 73 %
Neutro Abs: 6.7 10*3/uL (ref 1.7–7.7)
Platelets: 299 10*3/uL (ref 150–400)
RBC: 2.87 MIL/uL — ABNORMAL LOW (ref 3.87–5.11)
RDW: 17.9 % — ABNORMAL HIGH (ref 11.5–15.5)
WBC: 9.2 10*3/uL (ref 4.0–10.5)
nRBC: 0 % (ref 0.0–0.2)

## 2018-10-11 LAB — COMPREHENSIVE METABOLIC PANEL
ALBUMIN: 3.8 g/dL (ref 3.5–5.0)
ALT: 23 U/L (ref 0–44)
AST: 26 U/L (ref 15–41)
Alkaline Phosphatase: 50 U/L (ref 38–126)
Anion gap: 9 (ref 5–15)
BUN: 80 mg/dL — AB (ref 8–23)
CHLORIDE: 110 mmol/L (ref 98–111)
CO2: 22 mmol/L (ref 22–32)
Calcium: 9.2 mg/dL (ref 8.9–10.3)
Creatinine, Ser: 4.53 mg/dL — ABNORMAL HIGH (ref 0.44–1.00)
GFR calc Af Amer: 9 mL/min — ABNORMAL LOW (ref >60)
GFR calc non Af Amer: 8 mL/min — ABNORMAL LOW (ref >60)
GLUCOSE: 99 mg/dL (ref 70–99)
Potassium: 5 mmol/L (ref 3.5–5.1)
SODIUM: 141 mmol/L (ref 135–145)
Total Bilirubin: 0.7 mg/dL (ref 0.3–1.2)
Total Protein: 7.6 g/dL (ref 6.5–8.1)

## 2018-10-11 LAB — TROPONIN I
Troponin I: 0.06 ng/mL (ref <0.03)
Troponin I: 0.05 ng/mL (ref <0.03)
Troponin I: 0.05 ng/mL (ref <0.03)

## 2018-10-11 LAB — BRAIN NATRIURETIC PEPTIDE: B Natriuretic Peptide: 2897.9 pg/mL — ABNORMAL HIGH (ref 0.0–100.0)

## 2018-10-11 MED ORDER — ISOSORBIDE MONONITRATE ER 30 MG PO TB24
30.0000 mg | ORAL_TABLET | Freq: Every day | ORAL | Status: DC
  Administered 2018-10-12 – 2018-10-15 (×4): 30 mg via ORAL
  Filled 2018-10-11 (×4): qty 1

## 2018-10-11 MED ORDER — ONDANSETRON HCL 4 MG/2ML IJ SOLN: 4.0000 mg | Freq: Four times a day (QID) | INTRAMUSCULAR | Status: DC | PRN

## 2018-10-11 MED ORDER — FUROSEMIDE 10 MG/ML IJ SOLN
60.0000 mg | Freq: Three times a day (TID) | INTRAMUSCULAR | Status: AC
  Administered 2018-10-11 – 2018-10-12 (×3): 60 mg via INTRAVENOUS
  Filled 2018-10-11 (×3): qty 6

## 2018-10-11 MED ORDER — HYDRALAZINE HCL 20 MG/ML IJ SOLN: 10.0000 mg | Freq: Four times a day (QID) | INTRAMUSCULAR | Status: DC | PRN

## 2018-10-11 MED ORDER — BUMETANIDE 1 MG PO TABS
2.0000 mg | ORAL_TABLET | Freq: Two times a day (BID) | ORAL | Status: DC
  Filled 2018-10-11: qty 2

## 2018-10-11 MED ORDER — ACETAMINOPHEN 325 MG PO TABS
650.0000 mg | ORAL_TABLET | Freq: Four times a day (QID) | ORAL | Status: DC | PRN
  Administered 2018-10-14: 650 mg via ORAL
  Filled 2018-10-11: qty 2

## 2018-10-11 MED ORDER — METOPROLOL SUCCINATE ER 50 MG PO TB24: 50.0000 mg | ORAL_TABLET | Freq: Every day | ORAL | Status: DC

## 2018-10-11 MED ORDER — IPRATROPIUM-ALBUTEROL 0.5-2.5 (3) MG/3ML IN SOLN
3.0000 mL | Freq: Four times a day (QID) | RESPIRATORY_TRACT | Status: DC
  Administered 2018-10-11: 3 mL via RESPIRATORY_TRACT
  Filled 2018-10-11 (×2): qty 3

## 2018-10-11 MED ORDER — ENSURE ENLIVE PO LIQD
237.0000 mL | Freq: Two times a day (BID) | ORAL | Status: DC
  Administered 2018-10-12 – 2018-10-15 (×5): 237 mL via ORAL

## 2018-10-11 MED ORDER — TIOTROPIUM BROMIDE MONOHYDRATE 2.5 MCG/ACT IN AERS: 2.0000 | INHALATION_SPRAY | Freq: Every day | RESPIRATORY_TRACT | Status: DC

## 2018-10-11 MED ORDER — LEVALBUTEROL HCL 0.63 MG/3ML IN NEBU: 0.6300 mg | INHALATION_SOLUTION | Freq: Four times a day (QID) | RESPIRATORY_TRACT | Status: DC | PRN

## 2018-10-11 MED ORDER — LATANOPROST 0.005 % OP SOLN
1.0000 [drp] | Freq: Every day | OPHTHALMIC | Status: DC
  Administered 2018-10-11 – 2018-10-14 (×4): 1 [drp] via OPHTHALMIC
  Filled 2018-10-11: qty 2.5

## 2018-10-11 MED ORDER — METOPROLOL SUCCINATE ER 25 MG PO TB24
25.0000 mg | ORAL_TABLET | Freq: Every day | ORAL | Status: DC
  Administered 2018-10-12 – 2018-10-14 (×3): 25 mg via ORAL
  Filled 2018-10-11 (×3): qty 1

## 2018-10-11 MED ORDER — SIMVASTATIN 10 MG PO TABS
10.0000 mg | ORAL_TABLET | Freq: Every day | ORAL | Status: DC
  Administered 2018-10-11 – 2018-10-14 (×4): 10 mg via ORAL
  Filled 2018-10-11 (×4): qty 1

## 2018-10-11 MED ORDER — ADULT MULTIVITAMIN W/MINERALS CH
ORAL_TABLET | Freq: Every day | ORAL | Status: DC
  Administered 2018-10-12: 11:00:00 via ORAL
  Administered 2018-10-13 – 2018-10-15 (×3): 1 via ORAL
  Filled 2018-10-11 (×4): qty 1

## 2018-10-11 MED ORDER — TIOTROPIUM BROMIDE MONOHYDRATE 18 MCG IN CAPS
18.0000 ug | ORAL_CAPSULE | Freq: Every day | RESPIRATORY_TRACT | Status: DC
  Filled 2018-10-11: qty 5

## 2018-10-11 MED ORDER — HYDRALAZINE HCL 25 MG PO TABS
25.0000 mg | ORAL_TABLET | Freq: Three times a day (TID) | ORAL | Status: DC
  Administered 2018-10-11: 25 mg via ORAL
  Filled 2018-10-11: qty 1

## 2018-10-11 MED ORDER — AMLODIPINE BESYLATE 10 MG PO TABS
10.0000 mg | ORAL_TABLET | Freq: Every day | ORAL | Status: DC
  Administered 2018-10-12 – 2018-10-15 (×4): 10 mg via ORAL
  Filled 2018-10-11 (×4): qty 1

## 2018-10-11 MED ORDER — HYDRALAZINE HCL 50 MG PO TABS
50.0000 mg | ORAL_TABLET | Freq: Three times a day (TID) | ORAL | Status: DC
  Administered 2018-10-11 – 2018-10-12 (×3): 50 mg via ORAL
  Filled 2018-10-11 (×3): qty 1

## 2018-10-11 MED ORDER — ASPIRIN 81 MG PO CHEW
81.0000 mg | CHEWABLE_TABLET | Freq: Every day | ORAL | Status: DC
  Administered 2018-10-12 – 2018-10-15 (×4): 81 mg via ORAL
  Filled 2018-10-11 (×4): qty 1

## 2018-10-11 MED ORDER — COLESEVELAM HCL 625 MG PO TABS
625.0000 mg | ORAL_TABLET | Freq: Two times a day (BID) | ORAL | Status: DC | PRN
  Filled 2018-10-11: qty 1

## 2018-10-11 MED ORDER — GUAIFENESIN ER 600 MG PO TB12
600.0000 mg | ORAL_TABLET | Freq: Two times a day (BID) | ORAL | Status: DC
  Administered 2018-10-11 – 2018-10-15 (×8): 600 mg via ORAL
  Filled 2018-10-11 (×8): qty 1

## 2018-10-11 MED ORDER — VITAMIN D (ERGOCALCIFEROL) 1.25 MG (50000 UNIT) PO CAPS
50000.0000 [IU] | ORAL_CAPSULE | ORAL | Status: DC
  Administered 2018-10-14: 50000 [IU] via ORAL
  Filled 2018-10-11: qty 1

## 2018-10-11 MED ORDER — HEPARIN SODIUM (PORCINE) 5000 UNIT/ML IJ SOLN
5000.0000 [IU] | Freq: Three times a day (TID) | INTRAMUSCULAR | Status: DC
  Administered 2018-10-11 – 2018-10-15 (×11): 5000 [IU] via SUBCUTANEOUS
  Filled 2018-10-11 (×9): qty 1

## 2018-10-11 MED ORDER — NEPRO/CARBSTEADY PO LIQD
237.0000 mL | Freq: Two times a day (BID) | ORAL | Status: DC
  Administered 2018-10-12 – 2018-10-14 (×4): 237 mL via ORAL
  Filled 2018-10-11 (×8): qty 237

## 2018-10-11 NOTE — ED Triage Notes (Signed)
Patient is a resident of [facility]. Patient has had OS Sats in the 80's. Patient was discharged from the hospital 3 days ago.

## 2018-10-11 NOTE — ED Notes (Signed)
[place] MD aware pt on 2 lpm [place]; en route to pt room.

## 2018-10-11 NOTE — ED Notes (Signed)
ED TO INPATIENT HANDOFF REPORT  Name/Age/Gender Kathryn Peck 84 y.o. female  Code Status Code Status History    Date Active Date Inactive Code Status Order ID Comments User Context   10/04/2018 1626 10/08/2018 1936 DNR [id number]  Gherghe, Costin M, MD Inpatient    Questions for Most Recent Historical Code Status (Order [id number])    Question Answer Comment   In the event of cardiac or respiratory ARREST Do not call a "code blue"    In the event of cardiac or respiratory ARREST Do not perform Intubation, CPR, defibrillation or ACLS    In the event of cardiac or respiratory ARREST Use medication by any route, position, wound care, and other measures to relive pain and suffering. May use oxygen, suction and manual treatment of airway obstruction as needed for comfort.         Advance Directive Documentation     Most Recent Value  Type of Advance Directive  Healthcare Power of Attorney  Pre-existing out of facility DNR order (yellow form or pink MOST form)  -  "MOST" Form in Place?  -      Home/SNF/Other Home  Chief Complaint low oxygen level  Level of Care/Admitting Diagnosis ED Disposition    ED Disposition Condition Comment   Admit  Hospital Area: [facility] [[id number]]  Level of Care: Telemetry [5]  Admit to tele based on following criteria: Monitor for Ischemic changes  Diagnosis: Acute exacerbation of CHF (congestive heart failure) (HCC) [365582]  Admitting Physician: HALL, CAROLE N [[id number]]  Attending Physician: HALL, CAROLE N [[id number]]  Estimated length of stay: past midnight tomorrow  Certification:: I certify this patient will need inpatient services for at least 2 midnights  PT Class (Do Not Modify): Inpatient [101]  PT Acc Code (Do Not Modify): Private [1]       Medical History Past Medical History:  Diagnosis Date  . CHF (congestive heart failure) (HCC)   . Heart murmur     Allergies Allergies  Allergen Reactions  . Gelatin      Unknown, listed on MAR  . Latex Itching  . Neomycin     Unknown, listed on MAR  . Adhesive [Tape] Rash  . Influenza Vaccines Nausea And Vomiting    Vaccine made her "deathly sick".  She does not take them at all  . Zetia [Ezetimibe] Other (See Comments)    Unknown    IV Location/Drains/Wounds Patient Lines/Drains/Airways Status   Active Line/Drains/Airways    Name:   Placement date:   Placement time:   Site:   Days:   Peripheral IV 10/06/18 Anterior;Right Forearm   10/06/18    1427    Forearm   5   Peripheral IV 10/11/18 Right Antecubital   10/11/18    1351    Antecubital   less than 1   External Urinary Catheter   10/04/18    1416    -   7          Labs/Imaging Results for orders placed or performed during the hospital encounter of 10/11/18 (from the past 48 hour(s))  Comprehensive metabolic panel     Status: Abnormal   Collection Time: 10/11/18  1:51 PM  Result Value Ref Range   Sodium 141 135 - 145 mmol/L   Potassium 5.0 3.5 - 5.1 mmol/L   Chloride 110 98 - 111 mmol/L   CO2 22 22 - 32 mmol/L   Glucose, Bld 99 70 - 99 mg/dL   BUN 80 (  H) 8 - 23 mg/dL   Creatinine, Ser 4.53 (H) 0.44 - 1.00 mg/dL   Calcium 9.2 8.9 - 10.3 mg/dL   Total Protein 7.6 6.5 - 8.1 g/dL   Albumin 3.8 3.5 - 5.0 g/dL   AST 26 15 - 41 U/L   ALT 23 0 - 44 U/L   Alkaline Phosphatase 50 38 - 126 U/L   Total Bilirubin 0.7 0.3 - 1.2 mg/dL   GFR calc non Af Amer 8 (L) >60 mL/min   GFR calc Af Amer 9 (L) >60 mL/min    Comment: (NOTE) The eGFR has been calculated using the CKD EPI equation. This calculation has not been validated in all clinical situations. eGFR's persistently <60 mL/min signify possible Chronic Kidney Disease.    Anion gap 9 5 - 15    Comment: Performed at [facility], [address] [place], [place] [address]  Troponin I     Status: Abnormal   Collection Time: 10/11/18  1:51 PM  Result Value Ref Range   Troponin I 0.06 (HH) <0.03 ng/mL    Comment:  CRITICAL RESULT CALLED TO, READ BACK BY AND VERIFIED WITH: S.BINGHAM RN AT 1450 ON 10/28/19BY S.VANHOORNE Performed at [facility], [address] [place], [place] [address]   Brain natriuretic peptide     Status: Abnormal   Collection Time: 10/11/18  1:51 PM  Result Value Ref Range   B Natriuretic Peptide 2,897.9 (H) 0.0 - 100.0 pg/mL    Comment: Performed at [facility], [address] [place], [place] [address]  CBC with Differential     Status: Abnormal   Collection Time: 10/11/18  1:51 PM  Result Value Ref Range   WBC 9.2 4.0 - 10.5 K/uL   RBC 2.87 (L) 3.87 - 5.11 MIL/uL   Hemoglobin 8.1 (L) 12.0 - 15.0 g/dL   HCT 27.6 (L) 36.0 - 46.0 %   MCV 96.2 80.0 - 100.0 fL   MCH 28.2 26.0 - 34.0 pg   MCHC 29.3 (L) 30.0 - 36.0 g/dL   RDW 17.9 (H) 11.5 - 15.5 %   Platelets 299 150 - 400 K/uL   nRBC 0.0 0.0 - 0.2 %   Neutrophils Relative % 73 %   Neutro Abs 6.7 1.7 - 7.7 K/uL   Lymphocytes Relative 14 %   Lymphs Abs 1.3 0.7 - 4.0 K/uL   Monocytes Relative 10 %   Monocytes Absolute 0.9 0.1 - 1.0 K/uL   Eosinophils Relative 2 %   Eosinophils Absolute 0.2 0.0 - 0.5 K/uL   Basophils Relative 1 %   Basophils Absolute 0.1 0.0 - 0.1 K/uL   Immature Granulocytes 0 %   Abs Immature Granulocytes 0.04 0.00 - 0.07 K/uL    Comment: Performed at [facility], [address] [place], [place] [address]   Dg Chest 2 View  Result Date: 10/11/2018 CLINICAL DATA:  Shortness of breath. EXAM: CHEST - 2 VIEW COMPARISON:  Radiograph of October 08, 2018. FINDINGS: Stable cardiomegaly. Atherosclerosis of thoracic aorta is noted. No pneumothorax is noted. Small bilateral pleural effusions are noted. No consolidative process is noted. Bony thorax is unremarkable. IMPRESSION: Minimal bilateral pleural effusions. No other significant acute abnormality seen. Aortic Atherosclerosis (ICD10-I70.0). Electronically Signed   By: James  Green Jr, M.D.   On:  10/11/2018 13:39   EKG Interpretation  Date/Time:  Monday October 11 2018 15:24:15 EDT Ventricular Rate:  58 PR Interval:    QRS Duration: 106   QT Interval:  477 QTC Calculation: 469 R Axis:   80 Text Interpretation:  Sinus rhythm LVH with secondary repolarization abnormality rate decreased from prior. Confirmed by Davonna Belling [phone]) on 10/11/2018 3:27:43 PM   Pending Labs Unresulted Labs (From admission, onward)   None      Vitals/Pain Today's Vitals   10/11/18 1247 10/11/18 1248 10/11/18 1302 10/11/18 1519  BP: (!) 159/79   (!) 178/49  Pulse: (!) 55 (!) 53  (!) 51  Resp: 17 19  (!) 21  Temp:      TempSrc:      SpO2: (!) 89% 95% 98% 95%  Weight:      Height:      PainSc:        Isolation Precautions No active isolations  Medications Medications - No data to display  Mobility manual wheelchair

## 2018-10-11 NOTE — Telephone Encounter (Signed)
Spoke with patient's daughter, Marliss Coots, who reports that patient was discharged from [facility] on Friday, 10/08/18 back to [facility]. Patient's oxygen saturation levels have been 83% or less today. Patient's weight is up from 186 on Friday to 189 today. Belinda reports on ambulation, patient's oxygen drops to 70%. Patient's hands are always cold. Advised Belinda to warm her hands up and recheck. If she is still in the 70-80's, advised her that patient should return to the hospital. Belinda verbalized understanding. No further questions.   Patient is scheduled for a hospital follow up appointment on Friday, 10/15/18 at 10:00 am with Dr. Bettina Gavia.

## 2018-10-11 NOTE — ED Notes (Signed)
Patient placed on O2 2L/min. Sats increased to 95%.

## 2018-10-11 NOTE — ED Provider Notes (Signed)
[facility] DEPT Provider Note   CSN: [id number] Arrival date & time: 10/11/18  1156     History   Chief Complaint Chief Complaint  Patient presents with  . low sats    HPI Kathryn Peck is a 84 y.o. female.  HPI Patient presents with shortness of breath.  Discharge from the hospital 3 days ago for CHF at that time.  Since then weight has increased 3 pounds.  Was at assisted living today and had sats in the 80s and with ambulation went down to 70s.  Patient states that she feels okay.  Some swelling in her legs.  Rare cough.  No chest pain. Past Medical History:  Diagnosis Date  . CHF (congestive heart failure) ([place])   . Heart murmur     Patient Active Problem List   Diagnosis Date Noted  . CKD (chronic kidney disease) stage 5, GFR less than 15 ml/min (HCC) 10/05/2018  . Hypervolemia   . Acute systolic CHF (congestive heart failure) ([place])   . Acute diastolic CHF (congestive heart failure) ([place])   . CHF exacerbation ([place]) 10/04/2018  . Essential hypertension 10/04/2018  . COPD with chronic bronchitis ([place]) 10/04/2018  . Obesity (BMI 30-39.9) 10/04/2018  . OSA (obstructive sleep apnea) 09/24/2018  . COPD with acute exacerbation ([place]) 09/24/2018    Past Surgical History:  Procedure Laterality Date  . EYE SURGERY    . TONSILLECTOMY       OB History   None      Home Medications    Prior to Admission medications   Medication Sig Start Date End Date Taking? Authorizing Provider  acetaminophen (TYLENOL) 325 MG tablet Take 650 mg by mouth 2 (two) times daily.    Yes [provider]  amLODipine (NORVASC) 10 MG tablet Take 10 mg by mouth every morning.    Yes [provider]  aspirin 81 MG chewable tablet Chew 81 mg by mouth every morning.    Yes [provider]  bumetanide (BUMEX) 2 MG tablet Take 1 tablet (2 mg total) by mouth 2 (two) times daily. 10/08/18  Yes Sheikh, Omair Latif, DO  colesevelam  (WELCHOL) 625 MG tablet Take 625 mg by mouth 2 (two) times daily as needed (Cholesterol).  09/22/18  Yes [provider]  guaiFENesin (MUCINEX) 600 MG 12 hr tablet Take 1 tablet (600 mg total) by mouth 2 (two) times daily for 7 days. 10/08/18 10/15/18 Yes Sheikh, Omair Latif, DO  hydrALAZINE (APRESOLINE) 25 MG tablet Take 1 tablet (25 mg total) by mouth every 8 (eight) hours. 10/08/18  Yes Sheikh, Omair Latif, DO  isosorbide mononitrate (IMDUR) 30 MG 24 hr tablet Take 30 mg by mouth daily.   Yes [provider]  latanoprost (XALATAN) 0.005 % ophthalmic solution Place 1 drop into both eyes at bedtime.    Yes [provider]  levalbuterol (XOPENEX) 0.63 MG/3ML nebulizer solution Take 3 mLs (0.63 mg total) by nebulization every 6 (six) hours as needed for wheezing or shortness of breath. 10/08/18  Yes Sheikh, Omair Latif, DO  loperamide (IMODIUM A-D) 2 MG tablet Take 2 mg by mouth 2 (two) times daily as needed for diarrhea or loose stools.   Yes [provider]  metoprolol succinate (TOPROL-XL) 50 MG 24 hr tablet Take 50 mg by mouth daily.    Yes [provider]  Misc Natural Products (OSTEO BI-FLEX JOINT SHIELD PO) Take 1 tablet by mouth 2 (two) times daily.   Yes [provider]  Multiple Vitamins-Minerals (CENTRUM SILVER ULTRA WOMENS PO) Take 1 tablet by mouth every morning.    Yes [provider]  potassium chloride SA (K-DUR,KLOR-CON) 20 MEQ tablet Take 1 tablet (20 mEq total) by mouth 2 (two) times daily. 10/08/18  Yes Sheikh, Omair Latif, DO  Probiotic Product (PROBIOTIC COLON SUPPORT PO) Take 1 capsule by mouth every morning.    Yes [provider]  simvastatin (ZOCOR) 10 MG tablet Take 10 mg by mouth at bedtime.    Yes [provider]  Tiotropium Bromide Monohydrate (SPIRIVA RESPIMAT) 2.5 MCG/ACT AERS Inhale 2 puffs into the lungs daily. 09/23/18 10/23/18 Yes Fenton Foy, NP  Vitamin D, Ergocalciferol, (DRISDOL)  50000 units CAPS capsule Take 50,000 Units by mouth every 7 (seven) days. On Thursdays   Yes [provider]    Family History History reviewed. No pertinent family history.  Social History Social History   Tobacco Use  . Smoking status: Never Smoker  . Smokeless tobacco: Never Used  Substance Use Topics  . Alcohol use: Never    Alcohol/week: 0.0 standard drinks    Frequency: Never  . Drug use: Never     Allergies   Gelatin; Latex; Neomycin; Adhesive [tape]; Influenza vaccines; and Zetia [ezetimibe]   Review of Systems Review of Systems  Constitutional: Negative for appetite change.  HENT: Negative for congestion.   Respiratory: Positive for shortness of breath.   Cardiovascular: Positive for leg swelling. Negative for chest pain.  Gastrointestinal: Negative for abdominal pain.  Genitourinary: Negative for dysuria.  Musculoskeletal: Negative for back pain.  Skin: Negative for rash.  Neurological: Negative for weakness.  Hematological: Negative for adenopathy.  Psychiatric/Behavioral: Negative for confusion.     Physical Exam Updated Vital Signs BP (!) 178/49   Pulse (!) 51   Temp 98.6 F (37 C) (Oral)   Resp (!) 21   Ht 5\' 3"  (1.6 m)   Wt 84.6 kg   SpO2 95%   BMI 33.04 kg/m   Physical Exam  Constitutional: She appears well-developed.  HENT:  Head: Normocephalic.  Eyes: Pupils are equal, round, and reactive to light.  Neck: Neck supple.  Cardiovascular: Normal rate.  Pulmonary/Chest:  Rales bilateral bases.  Abdominal: There is no tenderness.  Musculoskeletal: She exhibits edema.  Neurological: She is alert.  Skin: Skin is warm. Capillary refill takes less than 2 seconds.  Psychiatric: She has a normal mood and affect.     ED Treatments / Results  Labs (all labs ordered are listed, but only abnormal results are displayed) Labs Reviewed  COMPREHENSIVE METABOLIC PANEL - Abnormal; Notable for the following components:      Result Value     BUN 80 (*)    Creatinine, Ser 4.53 (*)    GFR calc non Af Amer 8 (*)    GFR calc Af Amer 9 (*)    All other components within normal limits  TROPONIN I - Abnormal; Notable for the following components:   Troponin I 0.06 (*)    All other components within normal limits  BRAIN NATRIURETIC PEPTIDE - Abnormal; Notable for the following components:   B Natriuretic Peptide 2,897.9 (*)    All other components within normal limits  CBC WITH DIFFERENTIAL/PLATELET - Abnormal; Notable for the following components:   RBC 2.87 (*)    Hemoglobin 8.1 (*)    HCT 27.6 (*)    MCHC 29.3 (*)    RDW 17.9 (*)    All other components within normal  limits    EKG EKG Interpretation  Date/Time:  Monday October 11 2018 15:24:15 EDT Ventricular Rate:  58 PR Interval:    QRS Duration: 106 QT Interval:  477 QTC Calculation: 469 R Axis:   80 Text Interpretation:  Sinus rhythm LVH with secondary repolarization abnormality rate decreased from prior. Confirmed by Davonna Belling [phone]) on 10/11/2018 3:27:43 PM   Radiology Dg Chest 2 View  Result Date: 10/11/2018 CLINICAL DATA:  Shortness of breath. EXAM: CHEST - 2 VIEW COMPARISON:  Radiograph of October 08, 2018. FINDINGS: Stable cardiomegaly. Atherosclerosis of thoracic aorta is noted. No pneumothorax is noted. Small bilateral pleural effusions are noted. No consolidative process is noted. Bony thorax is unremarkable. IMPRESSION: Minimal bilateral pleural effusions. No other significant acute abnormality seen. Aortic Atherosclerosis (ICD10-I70.0). Electronically Signed   By: Marijo Conception, M.D.   On: 10/11/2018 13:39    Procedures Procedures (including critical care time)  Medications Ordered in ED Medications - No data to display   Initial Impression / Assessment and Plan / ED Course  I have reviewed the triage vital signs and the nursing notes.  Pertinent labs & imaging results that were available during my care of the patient were  reviewed by me and considered in my medical decision making (see chart for details).     Patient with shortness of breath and hypoxia.  New oxygen requirement.  Sats reportedly dip down into the 70s with ambulation.  BNP is elevated again.  Recent admission to the hospital but I think this is more likely volume overload as opposed to something else such as pulmonary embolism.  With new oxygen requirement and BNP elevated along with troponin mildly elevated I feel patient would benefit from admission for adjustment of her volume status.  May require oxygen at home also.  Final Clinical Impressions(s) / ED Diagnoses   Final diagnoses:  Hypoxia  Acute on chronic congestive heart failure, unspecified heart failure type [facility])    ED Discharge Orders    None       Davonna Belling, MD 10/11/18 1528

## 2018-10-11 NOTE — H&P (Signed)
History and Physical  Kathryn Peck QIO:[id number] DOB: 01-Jan-1928 DOA: 10/11/2018  Referring physician: Dr Alvino Chapel  PCP: Radene Ou Bill Salinas, MD  Outpatient Specialists: Nephrologist Dr Posey Pronto, Cardiologist Dr Wynonia Lawman Patient coming from: ALF  Chief Complaint: Shortness of breath  HPI: Kathryn Peck is a 84 y.o. female with medical history significant for chronic diastolic CHF, CKD 5, OSA on CPAP, COPD, hypertension, hyperlipidemia, who presented to [facility] from assisted living facility with complaints of shortness of breath.  Today at her assisted living facility patient was noted to struggle to catch her breath.  Her O2 sats were measured and revealed saturation in the 80s at rest and 70s on ambulation.   To note patient was recently discharged from the hospital 3 days ago after being admitted for acute CHF exacerbation.  Had an appointment to follow-up with heart failure team and nephrology but did not get a chance to make it to those appointments.  She denies any chest pain or palpitations.  ED Course: Upon presentation to the ED, patient was found to have accelerated hypertension.  Chest x-ray revealed mild increase in pulmonary vascularity.  BNP was elevated almost 3000 and troponin mildly elevated at 0.06.  Lab studies also remarkable for elevated creatinine with concern for AKI on CKD 5.  TRH was asked to admit due to concern for acute chronic diastolic CHF exacerbation and AKI with advanced renal failure.  Review of Systems: Review of systems as noted in the HPI. All other systems reviewed and are negative.   Past Medical History:  Diagnosis Date  . CHF (congestive heart failure) ([place])   . Heart murmur    Past Surgical History:  Procedure Laterality Date  . EYE SURGERY    . TONSILLECTOMY      Social History:  reports that she has never smoked. She has never used smokeless tobacco. She reports that she does not drink alcohol or use drugs.   Allergies  Allergen  Reactions  . Gelatin     Unknown, listed on MAR  . Latex Itching  . Neomycin     Unknown, listed on MAR  . Adhesive [Tape] Rash  . Influenza Vaccines Nausea And Vomiting    Vaccine made her "deathly sick".  She does not take them at all  . Zetia [Ezetimibe] Other (See Comments)    Unknown    History reviewed. No pertinent family history.    Prior to Admission medications   Medication Sig Start Date End Date Taking? Authorizing Provider  acetaminophen (TYLENOL) 325 MG tablet Take 650 mg by mouth 2 (two) times daily.    Yes [provider]  amLODipine (NORVASC) 10 MG tablet Take 10 mg by mouth every morning.    Yes [provider]  aspirin 81 MG chewable tablet Chew 81 mg by mouth every morning.    Yes [provider]  bumetanide (BUMEX) 2 MG tablet Take 1 tablet (2 mg total) by mouth 2 (two) times daily. 10/08/18  Yes Sheikh, Omair Latif, DO  colesevelam (WELCHOL) 625 MG tablet Take 625 mg by mouth 2 (two) times daily as needed (Cholesterol).  09/22/18  Yes [provider]  guaiFENesin (MUCINEX) 600 MG 12 hr tablet Take 1 tablet (600 mg total) by mouth 2 (two) times daily for 7 days. 10/08/18 10/15/18 Yes Sheikh, Omair Latif, DO  hydrALAZINE (APRESOLINE) 25 MG tablet Take 1 tablet (25 mg total) by mouth every 8 (eight) hours. 10/08/18  Yes Sheikh, Omair Latif, DO  isosorbide mononitrate (IMDUR) 30  MG 24 hr tablet Take 30 mg by mouth daily.   Yes [provider]  latanoprost (XALATAN) 0.005 % ophthalmic solution Place 1 drop into both eyes at bedtime.    Yes [provider]  levalbuterol (XOPENEX) 0.63 MG/3ML nebulizer solution Take 3 mLs (0.63 mg total) by nebulization every 6 (six) hours as needed for wheezing or shortness of breath. 10/08/18  Yes Sheikh, Omair Latif, DO  loperamide (IMODIUM A-D) 2 MG tablet Take 2 mg by mouth 2 (two) times daily as needed for diarrhea or loose stools.   Yes [provider]  metoprolol  succinate (TOPROL-XL) 50 MG 24 hr tablet Take 50 mg by mouth daily.    Yes [provider]  Misc Natural Products (OSTEO BI-FLEX JOINT SHIELD PO) Take 1 tablet by mouth 2 (two) times daily.   Yes [provider]  Multiple Vitamins-Minerals (CENTRUM SILVER ULTRA WOMENS PO) Take 1 tablet by mouth every morning.    Yes [provider]  potassium chloride SA (K-DUR,KLOR-CON) 20 MEQ tablet Take 1 tablet (20 mEq total) by mouth 2 (two) times daily. 10/08/18  Yes Sheikh, Omair Latif, DO  Probiotic Product (PROBIOTIC COLON SUPPORT PO) Take 1 capsule by mouth every morning.    Yes [provider]  simvastatin (ZOCOR) 10 MG tablet Take 10 mg by mouth at bedtime.    Yes [provider]  Tiotropium Bromide Monohydrate (SPIRIVA RESPIMAT) 2.5 MCG/ACT AERS Inhale 2 puffs into the lungs daily. 09/23/18 10/23/18 Yes Fenton Foy, NP  Vitamin D, Ergocalciferol, (DRISDOL) 50000 units CAPS capsule Take 50,000 Units by mouth every 7 (seven) days. On Thursdays   Yes [provider]    Physical Exam: BP (!) 162/38 (BP Location: Left Arm)   Pulse (!) 58   Temp 97.8 F (36.6 C) (Oral)   Resp 20   Ht 5\' 3"  (1.6 m)   Wt 84.6 kg   SpO2 97%   BMI 33.04 kg/m   . General: 84 y.o. year-old female well developed well nourished in no acute distress on 2 L of oxygen by nasal cannula.  Alert and interactive. . Cardiovascular: Regular rate and rhythm with no rubs or gallops.  No thyromegaly or JVD noted.  No lower extremity edema. 2/4 pulses in all 4 extremities. Marland Kitchen Respiratory: Mild rales at bases with no wheezes.  Poor inspiratory effort. . Abdomen: Soft nontender nondistended with normal bowel sounds x4 quadrants. . Muskuloskeletal: No cyanosis, clubbing.  Trace edema in lower extremities bilaterally. . Neuro: CN II-XII intact, strength, sensation, reflexes . Skin: No ulcerative lesions noted or rashes . Psychiatry: Judgement and insight appear normal. Mood is  appropriate for condition and setting          Labs on Admission:  Basic Metabolic Panel: Recent Labs  Lab 10/05/18 0356 10/06/18 0544 10/07/18 0516 10/08/18 0517 10/11/18 1351  NA 142 142 143 143 141  K 4.3 3.6 3.5 3.9 5.0  CL 113* 111 111 110 110  CO2 16* 19* 21* 22 22  GLUCOSE 83 99 111* 99 99  BUN 75* 77* 74* 79* 80*  CREATININE 4.13* 4.33* 4.24* 4.12* 4.53*  CALCIUM 9.0 8.9 9.1 9.0 9.2  MG  --  2.4 2.4 2.2  --   PHOS  --  6.4* 6.5* 5.9*  --    Liver Function Tests: Recent Labs  Lab 10/07/18 0516 10/08/18 0517 10/11/18 1351  AST 13* 12* 26  ALT 17 15 23   ALKPHOS 43 46 50  BILITOT  0.6 0.5 0.7  PROT 6.9 6.6 7.6  ALBUMIN 3.4* 3.4* 3.8   No results for input(s): LIPASE, AMYLASE in the last 168 hours. No results for input(s): AMMONIA in the last 168 hours. CBC: Recent Labs  Lab 10/06/18 0544 10/07/18 0516 10/08/18 0517 10/11/18 1351  WBC 7.5 7.8 8.8 9.2  NEUTROABS 4.9 5.3 6.1 6.7  HGB 7.9* 7.8* 7.6* 8.1*  HCT 26.3* 26.7* 26.3* 27.6*  MCV 93.9 94.7 94.9 96.2  PLT 299 298 287 299   Cardiac Enzymes: Recent Labs  Lab 10/04/18 2200 10/05/18 0356 10/11/18 1351  TROPONINI 0.04* 0.07* 0.06*    BNP (last 3 results) Recent Labs    10/04/18 1256 10/06/18 0544 10/11/18 1351  BNP 1,703.6* 1,597.2* 2,897.9*    ProBNP (last 3 results) No results for input(s): PROBNP in the last 8760 hours.  CBG: No results for input(s): GLUCAP in the last 168 hours.  Radiological Exams on Admission: Dg Chest 2 View  Result Date: 10/11/2018 CLINICAL DATA:  Shortness of breath. EXAM: CHEST - 2 VIEW COMPARISON:  Radiograph of October 08, 2018. FINDINGS: Stable cardiomegaly. Atherosclerosis of thoracic aorta is noted. No pneumothorax is noted. Small bilateral pleural effusions are noted. No consolidative process is noted. Bony thorax is unremarkable. IMPRESSION: Minimal bilateral pleural effusions. No other significant acute abnormality seen. Aortic Atherosclerosis  (ICD10-I70.0). Electronically Signed   By: Marijo Conception, M.D.   On: 10/11/2018 13:39    EKG: I independently viewed the EKG done and my findings are as followed: Sinus bradycardia with rate of 58 with no specific ST-T changes.  Assessment/Plan Present on Admission: **None**  Active Problems:   Acute exacerbation of CHF (congestive heart failure) (HCC)  Acute exacerbation of chronic diastolic CHF BNP significantly elevated nearly 3000 Last 2D echo done on 10/05/2018 revealed LVEF 55 to 60%, no regional wall motion abnormalities and grade 2 diastolic dysfunction Repeat BNP tomorrow Start IV Lasix 60 mg 3 times daily for 1 day Reassess renal function in the morning Strict I's and O's and daily weight Consult cardiology/heart failure team in the morning  Mild pulmonary edema secondary to acute on chronic diastolic CHF versus fluid overload from advanced renal failure  Independently reviewed chest x-ray done on admission which revealed increase in pulmonary vascularity suggestive of mild pulmonary edema Continue diuresis as stated above  Acute hypoxic respiratory failure most likely secondary to pulmonary edema versus others Continue to diuresis Supplement with O2 to maintain O2 saturation 92% or greater Will need to complete Home O2 evaluation prior to discharge to see if she qualifies for supplemental oxygen at home  Mildly elevated troponin Troponin 0.06 on presentation No chest pain Cycle x3 due to initial mild elevation Monitor on telemetry  AKI on CKD 5 Baseline creatinine appears to be 4.1 with GFR of 9 Creatinine on presentation 4.53 with GFR of 8 Has not yet seen nephrology outpatient Was set up to follow-up with Dr. Posey Pronto outpatient and did not make it to her first appointment Avoid nephrotoxic agent/dehydration/hypotension Monitor urine output Obtain renal ultrasound Start renal diet with fluid restriction Consult nephrology in the morning Repeat BMP in the  morning  Iron deficiency anemia/anemia of chronic disease Anemia is also possibly secondary to her advanced CKD in addition to iron deficiency Hemoglobin 8.1 which appears to be at her baseline No sign of overt bleeding Start ferrous sulfate supplement 325 daily Repeat CBC in the morning  COPD Resume COPD medications Duo nebs every 6 hours O2 supplementation to maintain  O2 saturation greater than 92%  OSA on CPAP Resume CPAP at night  Mild hyperkalemia Hold off potassium supplement in the setting of advanced kidney disease Repeat BMP in the morning  Hypertension Resume home antihypertensive medications Start IV hydralazine 10 mg PRN for systolic blood pressure greater than 027 or diastolic blood pressure greater than 105   DVT prophylaxis: Subcu heparin 3 times daily  Code Status: DNR  Family Communication: Daughter at bedside  Disposition Plan: Admit to telemetry  Consults called: None  Admission status: Inpatient    Kayleen Memos MD [facility] Pager [phone]  If 7PM-7AM, please contact night-coverage [email or web address] Password [facility]  10/11/2018, 5:44 PM

## 2018-10-12 ENCOUNTER — Ambulatory Visit: Payer: Medicare Other | Admitting: Pulmonary Disease

## 2018-10-12 ENCOUNTER — Inpatient Hospital Stay (HOSPITAL_COMMUNITY): Payer: Medicare Other

## 2018-10-12 DIAGNOSIS — I509 Heart failure, unspecified: Secondary | ICD-10-CM

## 2018-10-12 DIAGNOSIS — N185 Chronic kidney disease, stage 5: Secondary | ICD-10-CM

## 2018-10-12 DIAGNOSIS — I10 Essential (primary) hypertension: Secondary | ICD-10-CM

## 2018-10-12 DIAGNOSIS — I5031 Acute diastolic (congestive) heart failure: Secondary | ICD-10-CM

## 2018-10-12 LAB — BRAIN NATRIURETIC PEPTIDE: B NATRIURETIC PEPTIDE 5: 2455.8 pg/mL — AB (ref 0.0–100.0)

## 2018-10-12 LAB — BASIC METABOLIC PANEL
ANION GAP: 12 (ref 5–15)
BUN: 80 mg/dL — ABNORMAL HIGH (ref 8–23)
CHLORIDE: 107 mmol/L (ref 98–111)
CO2: 20 mmol/L — AB (ref 22–32)
Calcium: 8.8 mg/dL — ABNORMAL LOW (ref 8.9–10.3)
Creatinine, Ser: 4.31 mg/dL — ABNORMAL HIGH (ref 0.44–1.00)
GFR calc Af Amer: 10 mL/min — ABNORMAL LOW (ref >60)
GFR, EST NON AFRICAN AMERICAN: 8 mL/min — AB (ref >60)
GLUCOSE: 81 mg/dL (ref 70–99)
POTASSIUM: 4.6 mmol/L (ref 3.5–5.1)
Sodium: 139 mmol/L (ref 135–145)

## 2018-10-12 LAB — CBC
HEMATOCRIT: 27.4 % — AB (ref 36.0–46.0)
Hemoglobin: 7.9 g/dL — ABNORMAL LOW (ref 12.0–15.0)
MCH: 28 pg (ref 26.0–34.0)
MCHC: 28.8 g/dL — ABNORMAL LOW (ref 30.0–36.0)
MCV: 97.2 fL (ref 80.0–100.0)
PLATELETS: 267 10*3/uL (ref 150–400)
RBC: 2.82 MIL/uL — AB (ref 3.87–5.11)
RDW: 17.5 % — AB (ref 11.5–15.5)
WBC: 7.9 10*3/uL (ref 4.0–10.5)
nRBC: 0 % (ref 0.0–0.2)

## 2018-10-12 LAB — MAGNESIUM: Magnesium: 2.2 mg/dL (ref 1.7–2.4)

## 2018-10-12 LAB — PHOSPHORUS: Phosphorus: 6.1 mg/dL — ABNORMAL HIGH (ref 2.5–4.6)

## 2018-10-12 MED ORDER — IPRATROPIUM-ALBUTEROL 0.5-2.5 (3) MG/3ML IN SOLN
3.0000 mL | Freq: Three times a day (TID) | RESPIRATORY_TRACT | Status: AC
  Administered 2018-10-12 (×3): 3 mL via RESPIRATORY_TRACT
  Filled 2018-10-12 (×3): qty 3

## 2018-10-12 MED ORDER — FUROSEMIDE 10 MG/ML IJ SOLN
60.0000 mg | Freq: Three times a day (TID) | INTRAMUSCULAR | Status: AC
  Administered 2018-10-12 – 2018-10-13 (×3): 60 mg via INTRAVENOUS
  Filled 2018-10-12 (×3): qty 6

## 2018-10-12 MED ORDER — TIOTROPIUM BROMIDE MONOHYDRATE 18 MCG IN CAPS
18.0000 ug | ORAL_CAPSULE | Freq: Every day | RESPIRATORY_TRACT | Status: DC
  Administered 2018-10-13 – 2018-10-15 (×3): 18 ug via RESPIRATORY_TRACT
  Filled 2018-10-12: qty 5

## 2018-10-12 MED ORDER — HYDRALAZINE HCL 50 MG PO TABS
75.0000 mg | ORAL_TABLET | Freq: Three times a day (TID) | ORAL | Status: DC
  Administered 2018-10-12 – 2018-10-13 (×3): 75 mg via ORAL
  Filled 2018-10-12 (×3): qty 1

## 2018-10-12 MED ORDER — FERROUS SULFATE 325 (65 FE) MG PO TABS
325.0000 mg | ORAL_TABLET | Freq: Every day | ORAL | Status: DC
  Administered 2018-10-12 – 2018-10-15 (×4): 325 mg via ORAL
  Filled 2018-10-12 (×4): qty 1

## 2018-10-12 MED ORDER — ALBUTEROL SULFATE (2.5 MG/3ML) 0.083% IN NEBU
2.5000 mg | INHALATION_SOLUTION | Freq: Three times a day (TID) | RESPIRATORY_TRACT | Status: DC
  Administered 2018-10-13 – 2018-10-14 (×4): 2.5 mg via RESPIRATORY_TRACT
  Filled 2018-10-12 (×4): qty 3

## 2018-10-12 NOTE — Consult Note (Addendum)
Cardiology Consultation:   Patient ID: Kathryn Peck MRN: [id number]; DOB: 1928/11/21  Admit date: 10/11/2018 Date of Consult: 10/12/2018  Primary Care Provider: Aretta Nip, MD Primary Cardiologist: Skeet Latch, MD  Primary Electrophysiologist:  None    Patient Profile:   Kathryn Peck is a 84 y.o. female with a hx of chronic diastolic heart failure, moderate aortic stenosis, CKD stage 5, OSA on CPAP, COPD, hypertension, and HLD who is being seen today for the evaluation of SOB and hypertension at the request of Dr. Nevada Crane.  History of Present Illness:   [name] presented to [facility] from assisted living facility with complaints of SOB and found to be hypoxia in the 80s at rest and 70s with ambulation. On arrival to the ER, weight had increased 3 Lbs from recent discharge and had lower extremity swelling. BNP found to be > 2400 but with a creatinine of 4.31, K 4.6. She also was hypertensive with a mild troponin elevation of 0.06. She has received 60 mg lasix IV x 2 dose and has diuresed   He was recently discharge from the hospital on 10/08/18 for acute on chronic diastolic heart failure. She was discharged on 2 mg bumex BID, hydralazine 25 mg TID, imdur 30 mg daily, lopressor succinate 50 mg daily, and 10 mg norvasc. O2 was weaned off and she was ruled out for DVT by duplex. sCr at discharge was 4.12, baseline thought to be 4.00.   On my interview, she does not recall being short of breath and denies extra swelling in her legs. She is currently requiring oxygen, but states she is willing to go home with supplemental O2. She states she required it for a time 6 years ago. She feels fine, at baseline, and wants to discharge back to her facility today.   Past Medical History:  Diagnosis Date  . CHF (congestive heart failure) ([place])   . Heart murmur     Past Surgical History:  Procedure Laterality Date  . EYE SURGERY    . TONSILLECTOMY       Home Medications:    Prior to Admission medications   Medication Sig Start Date End Date Taking? Authorizing Provider  acetaminophen (TYLENOL) 325 MG tablet Take 650 mg by mouth 2 (two) times daily.    Yes [provider]  amLODipine (NORVASC) 10 MG tablet Take 10 mg by mouth every morning.    Yes [provider]  aspirin 81 MG chewable tablet Chew 81 mg by mouth every morning.    Yes [provider]  bumetanide (BUMEX) 2 MG tablet Take 1 tablet (2 mg total) by mouth 2 (two) times daily. 10/08/18  Yes Sheikh, Omair Latif, DO  colesevelam (WELCHOL) 625 MG tablet Take 625 mg by mouth 2 (two) times daily as needed (Cholesterol).  09/22/18  Yes [provider]  guaiFENesin (MUCINEX) 600 MG 12 hr tablet Take 1 tablet (600 mg total) by mouth 2 (two) times daily for 7 days. 10/08/18 10/15/18 Yes Sheikh, Omair Latif, DO  hydrALAZINE (APRESOLINE) 25 MG tablet Take 1 tablet (25 mg total) by mouth every 8 (eight) hours. 10/08/18  Yes Sheikh, Omair Latif, DO  isosorbide mononitrate (IMDUR) 30 MG 24 hr tablet Take 30 mg by mouth daily.   Yes [provider]  latanoprost (XALATAN) 0.005 % ophthalmic solution Place 1 drop into both eyes at bedtime.    Yes [provider]  levalbuterol (XOPENEX) 0.63 MG/3ML nebulizer solution Take 3 mLs (0.63 mg total) by nebulization every  6 (six) hours as needed for wheezing or shortness of breath. 10/08/18  Yes Sheikh, Omair Latif, DO  loperamide (IMODIUM A-D) 2 MG tablet Take 2 mg by mouth 2 (two) times daily as needed for diarrhea or loose stools.   Yes [provider]  metoprolol succinate (TOPROL-XL) 50 MG 24 hr tablet Take 50 mg by mouth daily.    Yes [provider]  Misc Natural Products (OSTEO BI-FLEX JOINT SHIELD PO) Take 1 tablet by mouth 2 (two) times daily.   Yes [provider]  Multiple Vitamins-Minerals (CENTRUM SILVER ULTRA WOMENS PO) Take 1 tablet by mouth every morning.    Yes [provider]   potassium chloride SA (K-DUR,KLOR-CON) 20 MEQ tablet Take 1 tablet (20 mEq total) by mouth 2 (two) times daily. 10/08/18  Yes Sheikh, Omair Latif, DO  Probiotic Product (PROBIOTIC COLON SUPPORT PO) Take 1 capsule by mouth every morning.    Yes [provider]  simvastatin (ZOCOR) 10 MG tablet Take 10 mg by mouth at bedtime.    Yes [provider]  Tiotropium Bromide Monohydrate (SPIRIVA RESPIMAT) 2.5 MCG/ACT AERS Inhale 2 puffs into the lungs daily. 09/23/18 10/23/18 Yes Fenton Foy, NP  Vitamin D, Ergocalciferol, (DRISDOL) 50000 units CAPS capsule Take 50,000 Units by mouth every 7 (seven) days. On Thursdays   Yes [provider]    Inpatient Medications: Scheduled Meds: . [START ON 10/13/2018] albuterol  2.5 mg Nebulization TID  . amLODipine  10 mg Oral Daily  . aspirin  81 mg Oral Daily  . feeding supplement (ENSURE ENLIVE)  237 mL Oral BID BM  . feeding supplement (NEPRO CARB STEADY)  237 mL Oral BID BM  . ferrous sulfate  325 mg Oral Q breakfast  . furosemide  60 mg Intravenous TID  . guaiFENesin  600 mg Oral BID  . heparin injection (subcutaneous)  5,000 Units Subcutaneous Q8H  . hydrALAZINE  50 mg Oral Q8H  . ipratropium-albuterol  3 mL Nebulization TID  . isosorbide mononitrate  30 mg Oral Daily  . latanoprost  1 drop Both Eyes QHS  . metoprolol succinate  25 mg Oral Daily  . multivitamin with minerals   Oral Daily  . simvastatin  10 mg Oral QHS  . [START ON 10/13/2018] tiotropium  18 mcg Inhalation Daily  . [START ON 10/14/2018] Vitamin D (Ergocalciferol)  50,000 Units Oral Q7 days   Continuous Infusions:  PRN Meds: acetaminophen, colesevelam, hydrALAZINE, levalbuterol, ondansetron (ZOFRAN) IV  Allergies:    Allergies  Allergen Reactions  . Gelatin     Unknown, listed on MAR  . Latex Itching  . Neomycin     Unknown, listed on MAR  . Adhesive [Tape] Rash  . Influenza Vaccines Nausea And Vomiting    Vaccine made her "deathly sick".   She does not take them at all  . Zetia [Ezetimibe] Other (See Comments)    Unknown    Social History:   Social History   Socioeconomic History  . Marital status: Unknown    Spouse name: Not on file  . Number of children: Not on file  . Years of education: Not on file  . Highest education level: Not on file  Occupational History  . Not on file  Social Needs  . Financial resource strain: Not on file  . Food insecurity:    Worry: Not on file    Inability: Not on file  . Transportation needs:    Medical: Not on file  Non-medical: Not on file  Tobacco Use  . Smoking status: Never Smoker  . Smokeless tobacco: Never Used  Substance and Sexual Activity  . Alcohol use: Never    Alcohol/week: 0.0 standard drinks    Frequency: Never  . Drug use: Never  . Sexual activity: Not on file  Lifestyle  . Physical activity:    Days per week: Not on file    Minutes per session: Not on file  . Stress: Not on file  Relationships  . Social connections:    Talks on phone: Not on file    Gets together: Not on file    Attends religious service: Not on file    Active member of club or organization: Not on file    Attends meetings of clubs or organizations: Not on file    Relationship status: Not on file  . Intimate partner violence:    Fear of current or ex partner: Not on file    Emotionally abused: Not on file    Physically abused: Not on file    Forced sexual activity: Not on file  Other Topics Concern  . Not on file  Social History Narrative  . Not on file    Family History:   History reviewed. No pertinent family history.   ROS:  Please see the history of present illness.   All other ROS reviewed and negative.     Physical Exam/Data:   Vitals:   10/12/18 0731 10/12/18 0734 10/12/18 0800 10/12/18 1257  BP:   (!) 169/48 (!) 164/44  Pulse:   (!) 53 (!) 57  Resp:   20 18  Temp:   98 F (36.7 C) 98.3 F (36.8 C)  TempSrc:   Oral Oral  SpO2: 96% 96% 96% 96%  Weight:       Height:        Intake/Output Summary (Last 24 hours) at 10/12/2018 1353 Last data filed at 10/12/2018 1258 Gross per 24 hour  Intake 530 ml  Output 1650 ml  Net -1120 ml   Filed Weights   10/11/18 1210 10/12/18 0536  Weight: 84.6 kg 83.8 kg   Body mass index is 32.73 kg/m.  General:  Well nourished, well developed, in no acute distress HEENT: normal Neck: minimal JVD Vascular: No carotid bruits  Cardiac:  normal S1, S2; RRR; no murmur Lungs:  clear to auscultation bilaterally, no wheezing, rhonchi or rales  Abd: soft, nontender, no hepatomegaly  Ext: B LE edema Musculoskeletal:  No deformities, BUE and BLE strength normal and equal Skin: warm and dry  Neuro:  CNs 2-12 intact, no focal abnormalities noted Psych:  Normal affect   EKG:  The EKG was personally reviewed and demonstrates:  Sinus, TWI inferior leads, unchanged from prior Telemetry:  Telemetry was personally reviewed and demonstrates:  sinus  Relevant CV Studies:  Echo 10/05/18: Study Conclusions - Left ventricle: The cavity size was normal. There was mild   concentric hypertrophy. Systolic function was normal. The   estimated ejection fraction was in the range of 55% to 60%. Wall   motion was normal; there were no regional wall motion   abnormalities. Features are consistent with a pseudonormal left   ventricular filling pattern, with concomitant abnormal relaxation   and increased filling pressure (grade 2 diastolic dysfunction). - Aortic valve: Cusp separation was moderately reduced. Noncoronary   cusp mobility was severely restricted. There was moderate   stenosis. There was trivial regurgitation. Valve area (VTI): 1.13   cm^2. Valve  area (Vmax): 1.31 cm^2. Valve area (Vmean): 1.21   cm^2. - Mitral valve: Severely calcified annulus. Mildly thickened   leaflets . The findings are consistent with mild stenosis. There   was moderate regurgitation directed eccentrically and   posteriorly. Mean gradient  (D): 6 mm Hg. Gradients at 60 bpm.   Valve area by continuity equation (using LVOT flow): 1.39 cm^2. - Left atrium: The atrium was severely dilated. - Right ventricle: The cavity size was mildly dilated. - Right atrium: The atrium was moderately dilated. - Tricuspid valve: There was mild-moderate regurgitation directed   centrally. - Pulmonary arteries: Systolic pressure was moderately increased.   PA peak pressure: 62 mm Hg (S).  Laboratory Data:  Chemistry Recent Labs  Lab 10/08/18 0517 10/11/18 1351 10/12/18 0448  NA 143 141 139  K 3.9 5.0 4.6  CL 110 110 107  CO2 22 22 20*  GLUCOSE 99 99 81  BUN 79* 80* 80*  CREATININE 4.12* 4.53* 4.31*  CALCIUM 9.0 9.2 8.8*  GFRNONAA 9* 8* 8*  GFRAA 10* 9* 10*  ANIONGAP 11 9 12     Recent Labs  Lab 10/07/18 0516 10/08/18 0517 10/11/18 1351  PROT 6.9 6.6 7.6  ALBUMIN 3.4* 3.4* 3.8  AST 13* 12* 26  ALT 17 15 23   ALKPHOS 43 46 50  BILITOT 0.6 0.5 0.7   Hematology Recent Labs  Lab 10/08/18 0517 10/11/18 1351 10/12/18 0655  WBC 8.8 9.2 7.9  RBC 2.77*  2.77* 2.87* 2.82*  HGB 7.6* 8.1* 7.9*  HCT 26.3* 27.6* 27.4*  MCV 94.9 96.2 97.2  MCH 27.4 28.2 28.0  MCHC 28.9* 29.3* 28.8*  RDW 17.6* 17.9* 17.5*  PLT 287 299 267   Cardiac Enzymes Recent Labs  Lab 10/11/18 1351 10/11/18 1709 10/11/18 2238  TROPONINI 0.06* 0.05* 0.05*   No results for input(s): TROPIPOC in the last 168 hours.  BNP Recent Labs  Lab 10/06/18 0544 10/11/18 1351 10/12/18 0448  BNP 1,597.2* 8,412.8* 2,455.8*    DDimer No results for input(s): DDIMER in the last 168 hours.  Radiology/Studies:  Dg Chest 2 View  Result Date: 10/11/2018 CLINICAL DATA:  Shortness of breath. EXAM: CHEST - 2 VIEW COMPARISON:  Radiograph of October 08, 2018. FINDINGS: Stable cardiomegaly. Atherosclerosis of thoracic aorta is noted. No pneumothorax is noted. Small bilateral pleural effusions are noted. No consolidative process is noted. Bony thorax is unremarkable.  IMPRESSION: Minimal bilateral pleural effusions. No other significant acute abnormality seen. Aortic Atherosclerosis (ICD10-I70.0). Electronically Signed   By: Marijo Conception, M.D.   On: 10/11/2018 13:39   US Renal  Result Date: 10/12/2018 CLINICAL DATA:  84 year old female with acute kidney insufficiency. Initial encounter. EXAM: RENAL / URINARY TRACT ULTRASOUND COMPLETE COMPARISON:  None. FINDINGS: Right Kidney: Length: 9.4 cm. Increased echogenicity of renal parenchyma. No hydronephrosis or mass. Left Kidney: Length: 8.9 cm. Difficult to visualize secondary to habitus/bowel gas. Increased echogenicity of renal parenchyma. No obvious hydronephrosis or mass. Bladder: Appears normal for degree of bladder distention. Bilateral pleural effusions. IMPRESSION: 1. Left kidney difficult to visualize secondary to habitus/bowel gas. Taking this limitation into account, no hydronephrosis is noted on either side. 2. Increased renal parenchyma echogenicity may reflect changes of medical renal disease. 3. Bilateral pleural effusions. Electronically Signed   By: Genia Del M.D.   On: 10/12/2018 12:59    Assessment and Plan:   1. Acute on chronic diastolic heart failure - BNP elevated, but in the setting of CKD stage IV-V - has received 60  mg IV lasix x 2 so far - has diuresed 1400 cc urine output and is 1.2 L net negative - weight is 184 lbs from 186 lbs - would appreciate nephrology input for fluid removal given her elevated creatinine   2. Anemia, suspct of chronic disease - Hb 7.9 - no overt source of bleeding - baseline appears to be 7.8-8.2   3. Troponin elevation  - troponin 0.06 --> 0.05 --> 0.05 - likely demand ischemia in the setting of anemia, CHF, and hypertension   4. Hypertension - pressures have been elevated in the 537-482L systolic - 10 mg norvasc, hydralazine 50 mg TID, imdur 30 mg daily, torpol 25 mg daily - will increase hydralazine to 75 mg TID for better pressure  control   5. Acute on chronic kidney disease stage IV-V - creatinine baseline 4.00, now elevated to 4.31 - would appreciate nephrology input for management of renal function and diuresis   For questions or updates, please contact [place] Please consult [email or web address] for contact info under     Signed, Ledora Bottcher, PA  10/12/2018 1:53 PM

## 2018-10-12 NOTE — Progress Notes (Signed)
PROGRESS NOTE  Kathryn Peck DXI:[id number] DOB: [birth year] DOA: 10/11/2018 PCP: Aretta Nip, MD  HPI/Recap of past 24 hours: Kathryn Peck is a 84 y.o. female discharged 3 days ago for the same with medical history significant for chronic diastolic CHF, CKD 5, OSA on CPAP, COPD, hypertension, hyperlipidemia, who presented to [facility] ED from assisted living facility with complaints of shortness of breath.  Today at her assisted living facility patient was noted to struggle to catch her breath.  Her O2 sats were measured and revealed saturation in the 80s at rest and 70s on ambulation.  [facility] asked to admit for acute diastolic chronic CHF and AKI on CKD 5.  10/12/2018: Patient seen and examined her bedside.  No acute events overnight.  States she feels better after diuresing.  Denies any chest pain or palpitations.  Wore her CPAP last night.    Assessment/Plan: Active Problems:   Acute exacerbation of CHF (congestive heart failure) (HCC)  Acute exacerbation of chronic diastolic CHF BNP improving Cardiology consulted On IV Lasix 60 mg 3 times daily for an additional day Continue strict I's and O's and daily weight Continue to monitor on telemetry  Acute hypoxic respiratory failure, improving Suspect secondary to pulmonary edema from acute on chronic diastolic CHF versus fluid overload from advanced renal failure O2 supplementation to maintain O2 saturation 92% or greater  AKI on CKD 5 Creatinine is improving post diuresing Continue to monitor renal function Avoid with oxygen Monitor urine output Renal ultrasound is pending Repeat BMP in the morning  Hyperphosphatemia Take 400 mg sevelamer with meals and repeat level in the am  OSA on CPAP Continue CPAP at night  Mild anion gap metabolic acidosis most likely secondary to renal failure Bicarb 20 and anion gap of 12 Continue to monitor Repeat BMP in the morning  Iron deficiency anemia/anemia of chronic  disease Continue ferrous sulfate 325 mg daily  Chronic diastolic CHF Last 2D echo done on 10/05/2018 revealed LVEF 55 to 60% no wall motion abnormality and grade 2 diastolic dysfunction Continue current medications Follows with cardiology outpatient Dr. Wynonia Lawman  Moderate pulmonary hypertension PA peak pressure 62    Code Status: DNR  Family Communication: None at bedside  Disposition Plan: Back to assisted living facility once hemodynamically stable   Consultants:  Cardiology  Procedures:  None  Antimicrobials:  None  DVT prophylaxis: Subcu heparin 3 times daily   Objective: Vitals:   10/12/18 0536 10/12/18 0731 10/12/18 0734 10/12/18 0800  BP:    (!) 169/48  Pulse: (!) 52   (!) 53  Resp: 18   20  Temp: 97.7 F (36.5 C)   98 F (36.7 C)  TempSrc: Oral   Oral  SpO2: 95% 96% 96% 96%  Weight: 83.8 kg     Height:        Intake/Output Summary (Last 24 hours) at 10/12/2018 1254 Last data filed at 10/12/2018 1000 Gross per 24 hour  Intake 310 ml  Output 1400 ml  Net -1090 ml   Filed Weights   10/11/18 1210 10/12/18 0536  Weight: 84.6 kg 83.8 kg    Exam:  . General: 84 y.o. year-old female well developed well nourished in no acute distress.  Alert and oriented x3. . Cardiovascular: Regular rate and rhythm with no rubs or gallops.  No thyromegaly or JVD noted.   Marland Kitchen Respiratory: Mild rales at bases with no wheezes. Good inspiratory effort. . Abdomen: Soft nontender nondistended with normal bowel sounds x4 quadrants. Marland Kitchen  Musculoskeletal: Trace lower extremity edema. 2/4 pulses in all 4 extremities. Marland Kitchen Psychiatry: Mood is appropriate for condition and setting   Data Reviewed: CBC: Recent Labs  Lab 10/06/18 0544 10/07/18 0516 10/08/18 0517 10/11/18 1351 10/12/18 0655  WBC 7.5 7.8 8.8 9.2 7.9  NEUTROABS 4.9 5.3 6.1 6.7  --   HGB 7.9* 7.8* 7.6* 8.1* 7.9*  HCT 26.3* 26.7* 26.3* 27.6* 27.4*  MCV 93.9 94.7 94.9 96.2 97.2  PLT 299 298 287 299 831    Basic Metabolic Panel: Recent Labs  Lab 10/06/18 0544 10/07/18 0516 10/08/18 0517 10/11/18 1351 10/12/18 0448  NA 142 143 143 141 139  K 3.6 3.5 3.9 5.0 4.6  CL 111 111 110 110 107  CO2 19* 21* 22 22 20*  GLUCOSE 99 111* 99 99 81  BUN 77* 74* 79* 80* 80*  CREATININE 4.33* 4.24* 4.12* 4.53* 4.31*  CALCIUM 8.9 9.1 9.0 9.2 8.8*  MG 2.4 2.4 2.2  --  2.2  PHOS 6.4* 6.5* 5.9*  --  6.1*   GFR: Estimated Creatinine Clearance: 9.3 mL/min (A) (by C-G formula based on SCr of 4.31 mg/dL (H)). Liver Function Tests: Recent Labs  Lab 10/07/18 0516 10/08/18 0517 10/11/18 1351  AST 13* 12* 26  ALT 17 15 23   ALKPHOS 43 46 50  BILITOT 0.6 0.5 0.7  PROT 6.9 6.6 7.6  ALBUMIN 3.4* 3.4* 3.8   No results for input(s): LIPASE, AMYLASE in the last 168 hours. No results for input(s): AMMONIA in the last 168 hours. Coagulation Profile: No results for input(s): INR, PROTIME in the last 168 hours. Cardiac Enzymes: Recent Labs  Lab 10/11/18 1351 10/11/18 1709 10/11/18 2238  TROPONINI 0.06* 0.05* 0.05*   BNP (last 3 results) No results for input(s): PROBNP in the last 8760 hours. HbA1C: No results for input(s): HGBA1C in the last 72 hours. CBG: No results for input(s): GLUCAP in the last 168 hours. Lipid Profile: No results for input(s): CHOL, HDL, LDLCALC, TRIG, CHOLHDL, LDLDIRECT in the last 72 hours. Thyroid Function Tests: No results for input(s): TSH, T4TOTAL, FREET4, T3FREE, THYROIDAB in the last 72 hours. Anemia Panel: No results for input(s): VITAMINB12, FOLATE, FERRITIN, TIBC, IRON, RETICCTPCT in the last 72 hours. Urine analysis:    Component Value Date/Time   COLORURINE YELLOW 10/04/2018 1742   APPEARANCEUR CLEAR 10/04/2018 1742   LABSPEC 1.011 10/04/2018 1742   PHURINE 5.0 10/04/2018 1742   GLUCOSEU NEGATIVE 10/04/2018 1742   HGBUR NEGATIVE 10/04/2018 1742   BILIRUBINUR NEGATIVE 10/04/2018 1742   KETONESUR NEGATIVE 10/04/2018 1742   PROTEINUR 30 (A) 10/04/2018  1742   NITRITE NEGATIVE 10/04/2018 1742   LEUKOCYTESUR NEGATIVE 10/04/2018 1742   Sepsis Labs: @LABRCNTIP (procalcitonin:4,lacticidven:4)  )No results found for this or any previous visit (from the past 240 hour(s)).    Studies: Dg Chest 2 View  Result Date: 10/11/2018 CLINICAL DATA:  Shortness of breath. EXAM: CHEST - 2 VIEW COMPARISON:  Radiograph of October 08, 2018. FINDINGS: Stable cardiomegaly. Atherosclerosis of thoracic aorta is noted. No pneumothorax is noted. Small bilateral pleural effusions are noted. No consolidative process is noted. Bony thorax is unremarkable. IMPRESSION: Minimal bilateral pleural effusions. No other significant acute abnormality seen. Aortic Atherosclerosis (ICD10-I70.0). Electronically Signed   By: Marijo Conception, M.D.   On: 10/11/2018 13:39    Scheduled Meds: . [START ON 10/13/2018] albuterol  2.5 mg Nebulization TID  . amLODipine  10 mg Oral Daily  . aspirin  81 mg Oral Daily  . feeding supplement (ENSURE  ENLIVE)  237 mL Oral BID BM  . feeding supplement (NEPRO CARB STEADY)  237 mL Oral BID BM  . ferrous sulfate  325 mg Oral Q breakfast  . furosemide  60 mg Intravenous TID  . guaiFENesin  600 mg Oral BID  . heparin injection (subcutaneous)  5,000 Units Subcutaneous Q8H  . hydrALAZINE  50 mg Oral Q8H  . ipratropium-albuterol  3 mL Nebulization TID  . isosorbide mononitrate  30 mg Oral Daily  . latanoprost  1 drop Both Eyes QHS  . metoprolol succinate  25 mg Oral Daily  . multivitamin with minerals   Oral Daily  . simvastatin  10 mg Oral QHS  . [START ON 10/13/2018] tiotropium  18 mcg Inhalation Daily  . [START ON 10/14/2018] Vitamin D (Ergocalciferol)  50,000 Units Oral Q7 days    Continuous Infusions:   LOS: 1 day     Kayleen Memos, MD [facility] Pager [phone]  If 7PM-7AM, please contact night-coverage [email or web address] Password [email or web address] 10/12/2018, 12:54 PM

## 2018-10-12 NOTE — Evaluation (Signed)
Physical Therapy Evaluation Patient Details Name: Kathryn Peck MRN: [id number] DOB: 1928-02-18 Today's Date: 10/12/2018   History of Present Illness  84 yo female admitted with CHF exac. Hx of CHF, obesity, OSA, CKD. Pt is from an ALF  Clinical Impression  On eval, pt required Min assist for mobility. She was only able to walk a very short distance on today. Pt is very shaky and weak. Dyspnea at rest and with exertion. She fatigues very quickly with minimal activity. At this time, recommendation is for ST rehab at SNF, if pt/family are agreeable. If they decline placement, recommend HHPT, 24 hour supervision/assist. Will follow and progress activity as tolerated.     Follow Up Recommendations SNF(If pt refuses placement, then HHPT and 24 hour supervision/assist. )    Equipment Recommendations  None recommended by PT    Recommendations for Other Services       Precautions / Restrictions Precautions Precautions: Fall Precaution Comments: monitor O2 Restrictions Weight Bearing Restrictions: No      Mobility  Bed Mobility               General bed mobility comments: oob in recliner  Transfers Overall transfer level: Needs assistance Equipment used: 4-wheeled walker Transfers: Sit to/from Stand Sit to Stand: Min assist         General transfer comment: cues for proper operation of 4WW and for hand placement. Assist to rise, stabilize, control descent. Unsteady.   Ambulation/Gait Ambulation/Gait assistance: Min Web designer (Feet): 10 Feet Assistive device: 4-wheeled walker Gait Pattern/deviations: Step-through pattern;Decreased stride length     General Gait Details: Pt fatigues very quickly. Only able to walk a few feet inside of her room on today. O2 sat 88% on RA, dyspnea 3/4.   Stairs            Wheelchair Mobility    Modified Rankin (Stroke Patients Only)       Balance Overall balance assessment: Needs assistance          Standing balance support: Bilateral upper extremity supported Standing balance-Leahy Scale: Poor                               Pertinent Vitals/Pain Pain Assessment: Faces Faces Pain Scale: No hurt    Home Living Family/patient expects to be discharged to:: Assisted living Living Arrangements: Alone   Type of Home: Apartment Home Access: Level entry     Home Layout: One level Home Equipment: Clinical cytogeneticist - 4 wheels;Transport chair      Prior Function Level of Independence: Needs assistance   Gait / Transfers Assistance Needed: pt reports using 4WW in apt, but needing w/c to go to and from meals in the facility  ADL's / Homemaking Assistance Needed: pt reports completing all ADLs at baseline, daugther states last week she had to help her shower (she had not in a week due to feeling unsteady)        Hand Dominance        Extremity/Trunk Assessment   Upper Extremity Assessment Upper Extremity Assessment: Generalized weakness    Lower Extremity Assessment Lower Extremity Assessment: Generalized weakness    Cervical / Trunk Assessment Cervical / Trunk Assessment: Kyphotic  Communication   Communication: HOH  Cognition Arousal/Alertness: Awake/alert Behavior During Therapy: WFL for tasks assessed/performed Overall Cognitive Status: Within Functional Limits for tasks assessed  General Comments: some poor insight into medical condition      General Comments      Exercises     Assessment/Plan    PT Assessment Patient needs continued PT services  PT Problem List Decreased strength;Decreased mobility;Decreased activity tolerance;Decreased knowledge of use of DME;Decreased balance       PT Treatment Interventions DME instruction;Therapeutic activities;Therapeutic exercise;Gait training;Patient/family education;Functional mobility training;Balance training    PT Goals (Current goals can be found  in the Care Plan section)  Acute Rehab PT Goals Patient Stated Goal: to return home PT Goal Formulation: With patient/family Time For Goal Achievement: 10/26/18 Potential to Achieve Goals: Fair    Frequency Min 3X/week   Barriers to discharge        Co-evaluation               AM-PAC PT "6 Clicks" Daily Activity  Outcome Measure Difficulty turning over in bed (including adjusting bedclothes, sheets and blankets)?: A Lot Difficulty moving from lying on back to sitting on the side of the bed? : Unable Difficulty sitting down on and standing up from a chair with arms (e.g., wheelchair, bedside commode, etc,.)?: Unable Help needed moving to and from a bed to chair (including a wheelchair)?: A Little Help needed walking in hospital room?: A Little Help needed climbing 3-5 steps with a railing? : Total 6 Click Score: 11    End of Session Equipment Utilized During Treatment: Oxygen Activity Tolerance: Patient limited by fatigue(limited by dyspnea) Patient left: in chair;with call bell/phone within reach;with family/visitor present   PT Visit Diagnosis: Muscle weakness (generalized) (M62.81);Unsteadiness on feet (R26.81);Difficulty in walking, not elsewhere classified (R26.2)    Time: 3748-2707 PT Time Calculation (min) (ACUTE ONLY): 34 min   Charges:   PT Evaluation $PT Eval Moderate Complexity: 1 Mod PT Treatments $Gait Training: 8-22 mins          Weston Anna, PT Acute Rehabilitation Services Pager: [phone] Office: [phone]

## 2018-10-13 DIAGNOSIS — R0600 Dyspnea, unspecified: Secondary | ICD-10-CM

## 2018-10-13 DIAGNOSIS — J449 Chronic obstructive pulmonary disease, unspecified: Secondary | ICD-10-CM

## 2018-10-13 DIAGNOSIS — I350 Nonrheumatic aortic (valve) stenosis: Secondary | ICD-10-CM | POA: Insufficient documentation

## 2018-10-13 DIAGNOSIS — E785 Hyperlipidemia, unspecified: Secondary | ICD-10-CM

## 2018-10-13 DIAGNOSIS — E119 Type 2 diabetes mellitus without complications: Secondary | ICD-10-CM

## 2018-10-13 DIAGNOSIS — I5033 Acute on chronic diastolic (congestive) heart failure: Secondary | ICD-10-CM

## 2018-10-13 HISTORY — DX: Dyspnea, unspecified: R06.00

## 2018-10-13 HISTORY — DX: Nonrheumatic aortic (valve) stenosis: I35.0

## 2018-10-13 HISTORY — DX: Type 2 diabetes mellitus without complications: E11.9

## 2018-10-13 HISTORY — DX: Hyperlipidemia, unspecified: E78.5

## 2018-10-13 LAB — BASIC METABOLIC PANEL
Anion gap: 12 (ref 5–15)
BUN: 88 mg/dL — AB (ref 8–23)
CHLORIDE: 105 mmol/L (ref 98–111)
CO2: 23 mmol/L (ref 22–32)
CREATININE: 4.15 mg/dL — AB (ref 0.44–1.00)
Calcium: 9.1 mg/dL (ref 8.9–10.3)
GFR calc Af Amer: 10 mL/min — ABNORMAL LOW (ref >60)
GFR calc non Af Amer: 9 mL/min — ABNORMAL LOW (ref >60)
GLUCOSE: 162 mg/dL — AB (ref 70–99)
Potassium: 3.4 mmol/L — ABNORMAL LOW (ref 3.5–5.1)
Sodium: 140 mmol/L (ref 135–145)

## 2018-10-13 MED ORDER — FUROSEMIDE 10 MG/ML IJ SOLN
60.0000 mg | Freq: Three times a day (TID) | INTRAMUSCULAR | Status: AC
  Administered 2018-10-13 (×2): 60 mg via INTRAVENOUS
  Filled 2018-10-13 (×2): qty 6

## 2018-10-13 MED ORDER — CALCIUM ACETATE (PHOS BINDER) 667 MG PO CAPS
667.0000 mg | ORAL_CAPSULE | Freq: Three times a day (TID) | ORAL | Status: DC
  Administered 2018-10-13 – 2018-10-15 (×6): 667 mg via ORAL
  Filled 2018-10-13 (×7): qty 1

## 2018-10-13 MED ORDER — POTASSIUM CHLORIDE CRYS ER 20 MEQ PO TBCR
20.0000 meq | EXTENDED_RELEASE_TABLET | Freq: Once | ORAL | Status: AC
  Administered 2018-10-13: 20 meq via ORAL
  Filled 2018-10-13: qty 1

## 2018-10-13 MED ORDER — SODIUM CHLORIDE 0.9 % IV SOLN
510.0000 mg | Freq: Once | INTRAVENOUS | Status: AC
  Administered 2018-10-13: 510 mg via INTRAVENOUS
  Filled 2018-10-13: qty 17

## 2018-10-13 MED ORDER — HYDRALAZINE HCL 50 MG PO TABS
100.0000 mg | ORAL_TABLET | Freq: Three times a day (TID) | ORAL | Status: DC
  Administered 2018-10-13 – 2018-10-15 (×5): 100 mg via ORAL
  Filled 2018-10-13 (×4): qty 2

## 2018-10-13 NOTE — Progress Notes (Signed)
Progress Note  Patient Name: Kathryn Peck Date of Encounter: 10/13/2018  Primary Cardiologist: Skeet Latch, MD   Subjective   Ms. Principato is sitting up in the chair. She denies shortness of breath or orthopnea. She says that her lower leg edema has improved, but not yet resolved.   Inpatient Medications    Scheduled Meds: . albuterol  2.5 mg Nebulization TID  . amLODipine  10 mg Oral Daily  . aspirin  81 mg Oral Daily  . feeding supplement (ENSURE ENLIVE)  237 mL Oral BID BM  . feeding supplement (NEPRO CARB STEADY)  237 mL Oral BID BM  . ferrous sulfate  325 mg Oral Q breakfast  . guaiFENesin  600 mg Oral BID  . heparin injection (subcutaneous)  5,000 Units Subcutaneous Q8H  . hydrALAZINE  75 mg Oral Q8H  . isosorbide mononitrate  30 mg Oral Daily  . latanoprost  1 drop Both Eyes QHS  . metoprolol succinate  25 mg Oral Daily  . multivitamin with minerals   Oral Daily  . simvastatin  10 mg Oral QHS  . tiotropium  18 mcg Inhalation Daily  . [START ON 10/14/2018] Vitamin D (Ergocalciferol)  50,000 Units Oral Q7 days   Continuous Infusions:  PRN Meds: acetaminophen, colesevelam, hydrALAZINE, levalbuterol, ondansetron (ZOFRAN) IV   Vital Signs    Vitals:   10/13/18 0607 10/13/18 0738 10/13/18 0741 10/13/18 1258  BP: (!) 173/47   (!) 165/51  Pulse: (!) 58   62  Resp:    20  Temp: 98.2 F (36.8 C)   98.4 F (36.9 C)  TempSrc: Oral   Oral  SpO2: 95% 98% 98% 97%  Weight:      Height:        Intake/Output Summary (Last 24 hours) at 10/13/2018 1401 Last data filed at 10/13/2018 0607 Gross per 24 hour  Intake 860 ml  Output 1600 ml  Net -740 ml   Filed Weights   10/11/18 1210 10/12/18 0536 10/13/18 0606  Weight: 84.6 kg 83.8 kg 83.8 kg    Telemetry    Sinus rhythm in the 60's with occ PACs - Personally Reviewed  ECG    No new tracings  - Personally Reviewed  Physical Exam   GEN: No acute distress.   Neck: No JVD Cardiac: RRR, harsh  systolic murmur at R and LUSB  Respiratory: Clear to auscultation bilaterally. GI: Soft, nontender, non-distended  MS: Trace lower leg edema; No deformity. Neuro:  Nonfocal  Psych: Normal affect   Labs    Chemistry Recent Labs  Lab 10/07/18 0516 10/08/18 0517 10/11/18 1351 10/12/18 0448  NA 143 143 141 139  K 3.5 3.9 5.0 4.6  CL 111 110 110 107  CO2 21* 22 22 20*  GLUCOSE 111* 99 99 81  BUN 74* 79* 80* 80*  CREATININE 4.24* 4.12* 4.53* 4.31*  CALCIUM 9.1 9.0 9.2 8.8*  PROT 6.9 6.6 7.6  --   ALBUMIN 3.4* 3.4* 3.8  --   AST 13* 12* 26  --   ALT 17 15 23   --   ALKPHOS 43 46 50  --   BILITOT 0.6 0.5 0.7  --   GFRNONAA 9* 9* 8* 8*  GFRAA 10* 10* 9* 10*  ANIONGAP 11 11 9 12      Hematology Recent Labs  Lab 10/08/18 0517 10/11/18 1351 10/12/18 0655  WBC 8.8 9.2 7.9  RBC 2.77*  2.77* 2.87* 2.82*  HGB 7.6* 8.1* 7.9*  HCT 26.3* 27.6*  27.4*  MCV 94.9 96.2 97.2  MCH 27.4 28.2 28.0  MCHC 28.9* 29.3* 28.8*  RDW 17.6* 17.9* 17.5*  PLT 287 299 267    Cardiac Enzymes Recent Labs  Lab 10/11/18 1351 10/11/18 1709 10/11/18 2238  TROPONINI 0.06* 0.05* 0.05*   No results for input(s): TROPIPOC in the last 168 hours.   BNP Recent Labs  Lab 10/11/18 1351 10/12/18 0448  BNP 2,897.9* 2,455.8*     DDimer No results for input(s): DDIMER in the last 168 hours.   Radiology    US Renal  Result Date: 10/12/2018 CLINICAL DATA:  84 year old female with acute kidney insufficiency. Initial encounter. EXAM: RENAL / URINARY TRACT ULTRASOUND COMPLETE COMPARISON:  None. FINDINGS: Right Kidney: Length: 9.4 cm. Increased echogenicity of renal parenchyma. No hydronephrosis or mass. Left Kidney: Length: 8.9 cm. Difficult to visualize secondary to habitus/bowel gas. Increased echogenicity of renal parenchyma. No obvious hydronephrosis or mass. Bladder: Appears normal for degree of bladder distention. Bilateral pleural effusions. IMPRESSION: 1. Left kidney difficult to visualize  secondary to habitus/bowel gas. Taking this limitation into account, no hydronephrosis is noted on either side. 2. Increased renal parenchyma echogenicity may reflect changes of medical renal disease. 3. Bilateral pleural effusions. Electronically Signed   By: Genia Del M.D.   On: 10/12/2018 12:59    Cardiac Studies   Echocardiogram 10/05/18  Patient Profile     84 y.o. female with a hx of chronic diastolic heart failure, moderate aortic stenosis, CKD stage 5, OSA on CPAP, COPD, hypertension, and HLD who is being seen today for the evaluation of SOB and hypertension.  Assessment & Plan    Acute on chronic diastolic heart failure -BNP was 2455 in setting of CKD -Patient is on Lasix 60 mg IV 3 times daily.  She did not get a morning dose but she did get a dose at noon today.  She has had a total of 6 doses since admission. -She has had 1.8 L of urine output yesterday and is net -1.3 L fluid balance since admission.  Weight is down a little less than 2 pounds.  Today's weight was the same as yesterday's. -We will check labs for today. -Pt is improved, not yet back to baseline. -Appreciate nephrology input. Plan to continue IV lasix for today and transition to Bumex tomorrow -Lower extremities negative for DVT  CKD stage 5 -Serum creatinine was stable at 4.31 yesterday, no labs today.  I will order daily metabolic panel.  Troponin elevation -Mild and flat pattern consistent with demand ischemia in the setting of anemia, CHF and hypertension  Hypertension -On Norvasc 10 mg, Imdur 30 mg daily, Toprol 25 mg daily.  Hydralazine increased from 50 mg 3 times daily to 75 mg 3 times daily for better blood pressure control. -Blood pressure continues to be elevated 165/51 after 2 doses of increased hydralazine, now on at max dose. Continue to monitor. Could increase Imdur to 60 mg.   OSA -Continue CPAP at hs      For questions or updates, please contact [place] Please consult  [email or web address] for contact info under        Signed, Daune Perch, NP  10/13/2018, 2:01 PM

## 2018-10-13 NOTE — Progress Notes (Signed)
[facility]  PROGRESS NOTE  Kathryn Peck ZYY:[id number] DOB: 12-May-1928 DOA: 10/11/2018 PCP: Aretta Nip, MD   Brief HPI:   84 year old female was discharged 3 days ago for shortness of breath has significant history of chronic diastolic CHF, CKD stage V, obstructive sleep apnea on CPAP, COPD, hypertension, hyperlipidemia who came to the ED with shortness of breath.  At assisted facility patient was noted to be struggling to breathe.  O2 sats were 80s on rest and 70s on ambulation.    Subjective   Patient seen and examined, breathing is improved.   Assessment/Plan:     1. Acute exacerbation of chronic diastolic CHF-continue IV Lasix 60 mg 3 times daily, strict I's and O's, follow BMP in a.m.  2. Acute kidney injury on CKD stage V-creatinine is slowly improving, today creatinine is 4.15.  Follow IV diuresis with Lasix.  Check renal function in a.m.  Renal ultrasound shows no significant abnormality.  Nephrology was consulted and recommend starting on Bumex from tomorrow morning.  3. Obstructive sleep apnea-continue CPAP at night  4. Iron deficiency anemia-continue ferrous sulfate 325 mg daily  5. Moderate pulmonary hypertension-PA peak pressure 62 mmHg      CBC: Recent Labs  Lab 10/07/18 0516 10/08/18 0517 10/11/18 1351 10/12/18 0655  WBC 7.8 8.8 9.2 7.9  NEUTROABS 5.3 6.1 6.7  --   HGB 7.8* 7.6* 8.1* 7.9*  HCT 26.7* 26.3* 27.6* 27.4*  MCV 94.7 94.9 96.2 97.2  PLT 298 287 299 488    Basic Metabolic Panel: Recent Labs  Lab 10/07/18 0516 10/08/18 0517 10/11/18 1351 10/12/18 0448 10/13/18 1500  NA 143 143 141 139 140  K 3.5 3.9 5.0 4.6 3.4*  CL 111 110 110 107 105  CO2 21* 22 22 20* 23  GLUCOSE 111* 99 99 81 162*  BUN 74* 79* 80* 80* 88*  CREATININE 4.24* 4.12* 4.53* 4.31* 4.15*  CALCIUM 9.1 9.0 9.2 8.8* 9.1  MG 2.4 2.2  --  2.2  --   PHOS 6.5* 5.9*  --  6.1*  --      DVT prophylaxis: SCDs  Code Status: DNR  Family Communication:  Discussed with patient's daughter at bedside  Disposition Plan: likely home when medically ready for discharge   Consultants:  Cardiology  Procedures:  None   Antibiotics:   Anti-infectives (From admission, onward)   None       Objective   Vitals:   10/13/18 0607 10/13/18 0738 10/13/18 0741 10/13/18 1258  BP: (!) 173/47   (!) 165/51  Pulse: (!) 58   62  Resp:    20  Temp: 98.2 F (36.8 C)   98.4 F (36.9 C)  TempSrc: Oral   Oral  SpO2: 95% 98% 98% 97%  Weight:      Height:        Intake/Output Summary (Last 24 hours) at 10/13/2018 1646 Last data filed at 10/13/2018 1436 Gross per 24 hour  Intake 1680 ml  Output 1600 ml  Net 80 ml   Filed Weights   10/11/18 1210 10/12/18 0536 10/13/18 0606  Weight: 84.6 kg 83.8 kg 83.8 kg     Physical Examination:    General: Appears in no acute distress  Cardiovascular: S1-S2, regular  Respiratory: Clear to auscultation bilaterally  Abdomen: Soft, nontender, no organomegaly  Extremities: No edema of the lower extremities  Neurologic: Alert, oriented x3, no focal deficit noted.     Data Reviewed: I have personally reviewed following labs and imaging studies  No results found for this or any previous visit (from the past 240 hour(s)).   Liver Function Tests: Recent Labs  Lab 10/07/18 0516 10/08/18 0517 10/11/18 1351  AST 13* 12* 26  ALT 17 15 23   ALKPHOS 43 46 50  BILITOT 0.6 0.5 0.7  PROT 6.9 6.6 7.6  ALBUMIN 3.4* 3.4* 3.8   No results for input(s): LIPASE, AMYLASE in the last 168 hours. No results for input(s): AMMONIA in the last 168 hours.  Cardiac Enzymes: Recent Labs  Lab 10/11/18 1351 10/11/18 1709 10/11/18 2238  TROPONINI 0.06* 0.05* 0.05*   BNP (last 3 results) Recent Labs    10/06/18 0544 10/11/18 1351 October 21, 2018 0448  BNP 1,597.2* 2,897.9* 2,455.8*    ProBNP (last 3 results) No results for input(s): PROBNP in the last 8760 hours.    Studies: US Renal  Result  Date: 2018/10/21 CLINICAL DATA:  83 year old female with acute kidney insufficiency. Initial encounter. EXAM: RENAL / URINARY TRACT ULTRASOUND COMPLETE COMPARISON:  None. FINDINGS: Right Kidney: Length: 9.4 cm. Increased echogenicity of renal parenchyma. No hydronephrosis or mass. Left Kidney: Length: 8.9 cm. Difficult to visualize secondary to habitus/bowel gas. Increased echogenicity of renal parenchyma. No obvious hydronephrosis or mass. Bladder: Appears normal for degree of bladder distention. Bilateral pleural effusions. IMPRESSION: 1. Left kidney difficult to visualize secondary to habitus/bowel gas. Taking this limitation into account, no hydronephrosis is noted on either side. 2. Increased renal parenchyma echogenicity may reflect changes of medical renal disease. 3. Bilateral pleural effusions. Electronically Signed   By: Genia Del M.D.   On: 10-21-2018 12:59    Scheduled Meds: . albuterol  2.5 mg Nebulization TID  . amLODipine  10 mg Oral Daily  . aspirin  81 mg Oral Daily  . calcium acetate  667 mg Oral TID WC  . feeding supplement (ENSURE ENLIVE)  237 mL Oral BID BM  . feeding supplement (NEPRO CARB STEADY)  237 mL Oral BID BM  . ferrous sulfate  325 mg Oral Q breakfast  . furosemide  60 mg Intravenous TID  . guaiFENesin  600 mg Oral BID  . heparin injection (subcutaneous)  5,000 Units Subcutaneous Q8H  . hydrALAZINE  100 mg Oral Q8H  . isosorbide mononitrate  30 mg Oral Daily  . latanoprost  1 drop Both Eyes QHS  . metoprolol succinate  25 mg Oral Daily  . multivitamin with minerals   Oral Daily  . simvastatin  10 mg Oral QHS  . tiotropium  18 mcg Inhalation Daily  . [START ON 10/14/2018] Vitamin D (Ergocalciferol)  50,000 Units Oral Q7 days    Admission status: Inpatient: Based on patients clinical presentation and evaluation of above clinical data, I have made determination that patient meets Inpatient criteria at this time.  Patient admitted with acute on chronic  respiratory failure due to diastolic heart failure.  Currently not at baseline.  Requiring oxygen by nasal cannula, IV diuresis with Lasix 60 mg daily 8 hours.  Time spent: 20 min  [facility] Pager [phone]. If 7PM-7AM, please contact night-coverage at [email or web address], Office  [phone]  password [email or web address]  10/13/2018, 4:46 PM  LOS: 2 days

## 2018-10-13 NOTE — Care Management Note (Signed)
Case Management Note  Patient Details  Name: Kathryn Peck MRN: [id number] Date of Birth: October 02, 1928  Subjective/Objective:   Pt admitted with CHF                 Action/Plan: Plan to discharge back to [place] with [facility] RN/PT/OT/SW.  Spoke with [place], [place], at ALF concerning D/C needs. Karena Addison states pt will not need NA at present time.    Expected Discharge Date:  (unknown)               Expected Discharge Plan:  Assisted Living / Rest Home  In-House Referral:     Discharge planning Services  CM Consult  Post Acute Care Choice:  Home Health Choice offered to:  Adult Children  DME Arranged:    DME Agency:     HH Arranged:  RN, PT, OT, Social Work [employer] Agency:  [employer] (now [facility])  Status of Service:  Completed, signed off  If discussed at [employer], dates discussed:    Additional Comments:  Purcell Mouton, RN 10/13/2018, 10:01 AM

## 2018-10-13 NOTE — Consult Note (Signed)
Referring Provider: No ref. provider found Primary Care Physician:  Aretta Nip, MD Primary Nephrologist:  Dr. Posey Pronto  Reason for Consultation:   Stage V chronic kidney disease baseline creatinine 4.2-4.5.  Anemia, diastolic congestive heart failure, hypertension.  HPI: This is a very pleasant lady who is followed at [facility], she has a history of chronic diastolic heart failure chronic kidney disease stage V COPD hypertension, hyperlipidemia.  She was admitted with increasing dyspnea.  Chest x-ray consistent with minimal bilateral pleural effusions.  Has improved with diuresis.  Weight decreased from 84.6 kg 1028 2019 to 83.8 kg 10/13/2018.  Does not appear to be any use of nonsteroidal anti-inflammatory drugs ibuprofen Motrin Aleve Advil Cox 2 inhibitors she does not use ACE inhibitors ARB's.  There does not seem to be in the administration of any nephrotoxins or IV contrast no antibiotic use.  Blood pressure 160/50   T  98.4 pulse 62 O2 sats 97% room air urine output 2.2 L  Sodium 139 potassium 4.6 chloride 107 CO2 20 creatinine 4.31 BUN 80 glucose 81 phosphorus 6.1 magnesium 2.2 liver enzymes within normal range iron saturations 8% hemoglobin 7.9    Past Medical History:  Diagnosis Date  . Acute diastolic CHF (congestive heart failure) ([place])   . Acute exacerbation of CHF (congestive heart failure) ([place]) 10/11/2018  . Acute systolic CHF (congestive heart failure) ([place])   . Aortic valve stenosis 10/13/2018  . CHF (congestive heart failure) ([place])   . CHF exacerbation ([place]) 10/04/2018  . CKD (chronic kidney disease) stage 5, GFR less than 15 ml/min (HCC) 10/05/2018  . COPD with acute exacerbation ([place]) 09/24/2018  . COPD with chronic bronchitis ([place]) 10/04/2018  . Dyspnea 10/13/2018  . Essential hypertension 10/04/2018  . Heart murmur   . Hyperlipidemia 10/13/2018  . Hypervolemia   . Obesity (BMI 30-39.9) 10/04/2018  . OSA (obstructive sleep apnea) 09/24/2018   . Type 2 diabetes mellitus ([place]) 10/13/2018    Past Surgical History:  Procedure Laterality Date  . CATARACT EXTRACTION    . EYE SURGERY    . TONSILLECTOMY      Prior to Admission medications   Medication Sig Start Date End Date Taking? Authorizing Provider  acetaminophen (TYLENOL) 325 MG tablet Take 650 mg by mouth 2 (two) times daily.    Yes [provider]  amLODipine (NORVASC) 10 MG tablet Take 10 mg by mouth every morning.    Yes [provider]  aspirin 81 MG chewable tablet Chew 81 mg by mouth every morning.    Yes [provider]  bumetanide (BUMEX) 2 MG tablet Take 1 tablet (2 mg total) by mouth 2 (two) times daily. 10/08/18  Yes Sheikh, Omair Latif, DO  colesevelam (WELCHOL) 625 MG tablet Take 625 mg by mouth 2 (two) times daily as needed (Cholesterol).  09/22/18  Yes [provider]  guaiFENesin (MUCINEX) 600 MG 12 hr tablet Take 1 tablet (600 mg total) by mouth 2 (two) times daily for 7 days. 10/08/18 10/15/18 Yes Sheikh, Omair Latif, DO  hydrALAZINE (APRESOLINE) 25 MG tablet Take 1 tablet (25 mg total) by mouth every 8 (eight) hours. 10/08/18  Yes Sheikh, Omair Latif, DO  isosorbide mononitrate (IMDUR) 30 MG 24 hr tablet Take 30 mg by mouth daily.   Yes [provider]  latanoprost (XALATAN) 0.005 % ophthalmic solution Place 1 drop into both eyes at bedtime.    Yes [provider]  levalbuterol (XOPENEX) 0.63 MG/3ML nebulizer solution Take 3 mLs (0.63  mg total) by nebulization every 6 (six) hours as needed for wheezing or shortness of breath. 10/08/18  Yes Sheikh, Omair Latif, DO  loperamide (IMODIUM A-D) 2 MG tablet Take 2 mg by mouth 2 (two) times daily as needed for diarrhea or loose stools.   Yes [provider]  metoprolol succinate (TOPROL-XL) 50 MG 24 hr tablet Take 50 mg by mouth daily.    Yes [provider]  Misc Natural Products (OSTEO BI-FLEX JOINT SHIELD PO) Take 1 tablet by mouth 2 (two) times  daily.   Yes [provider]  Multiple Vitamins-Minerals (CENTRUM SILVER ULTRA WOMENS PO) Take 1 tablet by mouth every morning.    Yes [provider]  potassium chloride SA (K-DUR,KLOR-CON) 20 MEQ tablet Take 1 tablet (20 mEq total) by mouth 2 (two) times daily. 10/08/18  Yes Sheikh, Omair Latif, DO  Probiotic Product (PROBIOTIC COLON SUPPORT PO) Take 1 capsule by mouth every morning.    Yes [provider]  simvastatin (ZOCOR) 10 MG tablet Take 10 mg by mouth at bedtime.    Yes [provider]  Tiotropium Bromide Monohydrate (SPIRIVA RESPIMAT) 2.5 MCG/ACT AERS Inhale 2 puffs into the lungs daily. 09/23/18 10/23/18 Yes Fenton Foy, NP  Vitamin D, Ergocalciferol, (DRISDOL) 50000 units CAPS capsule Take 50,000 Units by mouth every 7 (seven) days. On Thursdays   Yes [provider]    Current Facility-Administered Medications  Medication Dose Route Frequency Provider Last Rate Last Dose  . acetaminophen (TYLENOL) tablet 650 mg  650 mg Oral Q6H PRN Irene Pap N, DO      . albuterol (PROVENTIL) (2.5 MG/3ML) 0.083% nebulizer solution 2.5 mg  2.5 mg Nebulization TID Irene Pap N, DO   2.5 mg at 10/13/18 0737  . amLODipine (NORVASC) tablet 10 mg  10 mg Oral Daily Irene Pap N, DO   10 mg at 10/13/18 1213  . aspirin chewable tablet 81 mg  81 mg Oral Daily Irene Pap N, DO   81 mg at 10/13/18 1212  . colesevelam [facility]) tablet 625 mg  625 mg Oral BID PRN Irene Pap N, DO      . feeding supplement (ENSURE ENLIVE) (ENSURE ENLIVE) liquid 237 mL  237 mL Oral BID BM Hall, Carole N, DO   237 mL at 10/13/18 1213  . feeding supplement (NEPRO CARB STEADY) liquid 237 mL  237 mL Oral BID BM Hall, Carole N, DO   237 mL at 10/13/18 1214  . ferrous sulfate tablet 325 mg  325 mg Oral Q breakfast Kayleen Memos, DO   325 mg at 10/13/18 1212  . guaiFENesin (MUCINEX) 12 hr tablet 600 mg  600 mg Oral BID Irene Pap N, DO   600 mg at 10/13/18 1215  . heparin  injection 5,000 Units  5,000 Units Subcutaneous Q8H Irene Pap N, DO   5,000 Units at 10/13/18 6010  . hydrALAZINE (APRESOLINE) injection 10 mg  10 mg Intravenous Q6H PRN Irene Pap N, DO      . hydrALAZINE (APRESOLINE) tablet 75 mg  75 mg Oral Q8H Ledora Bottcher, [place]   75 mg at 10/13/18 0617  . isosorbide mononitrate (IMDUR) 24 hr tablet 30 mg  30 mg Oral Daily Irene Pap N, DO   30 mg at 10/13/18 1212  . latanoprost (XALATAN) 0.005 % ophthalmic solution 1 drop  1 drop Both Eyes QHS Hall, Archie Patten N, DO   1 drop at 10/12/18 2104  . levalbuterol (XOPENEX) nebulizer solution 0.63  mg  0.63 mg Nebulization Q6H PRN Irene Pap N, DO      . metoprolol succinate (TOPROL-XL) 24 hr tablet 25 mg  25 mg Oral Daily Irene Pap N, DO   25 mg at 10/13/18 1212  . multivitamin with minerals tablet   Oral Daily Kayleen Memos, DO   1 tablet at 10/13/18 1212  . ondansetron (ZOFRAN) injection 4 mg  4 mg Intravenous Q6H PRN Irene Pap N, DO      . simvastatin (ZOCOR) tablet 10 mg  10 mg Oral QHS Hall, Carole N, DO   10 mg at 10/12/18 2103  . tiotropium (SPIRIVA) inhalation capsule ([facility] use ONLY) 18 mcg  18 mcg Inhalation Daily Irene Pap N, DO   18 mcg at 10/13/18 7793  . [START ON 10/14/2018] Vitamin D (Ergocalciferol) (DRISDOL) capsule 50,000 Units  50,000 Units Oral Q7 days Kayleen Memos, DO        Allergies as of 10/11/2018 - Review Complete 10/11/2018  Allergen Reaction Noted  . Gelatin  10/11/2018  . Latex Itching 10/18/2015  . Neomycin  10/11/2018  . Adhesive [tape] Rash 10/18/2015  . Influenza vaccines Nausea And Vomiting 10/04/2018  . Zetia [ezetimibe] Other (See Comments) 10/18/2015    Family History  Problem Relation Age of Onset  . Heart disease Father     Social History   Socioeconomic History  . Marital status: Unknown    Spouse name: Not on file  . Number of children: Not on file  . Years of education: Not on file  . Highest education level: Not on file  Occupational  History  . Not on file  Social Needs  . Financial resource strain: Not on file  . Food insecurity:    Worry: Not on file    Inability: Not on file  . Transportation needs:    Medical: Not on file    Non-medical: Not on file  Tobacco Use  . Smoking status: Never Smoker  . Smokeless tobacco: Never Used  Substance and Sexual Activity  . Alcohol use: Never    Alcohol/week: 0.0 standard drinks    Frequency: Never  . Drug use: Never  . Sexual activity: Not on file  Lifestyle  . Physical activity:    Days per week: Not on file    Minutes per session: Not on file  . Stress: Not on file  Relationships  . Social connections:    Talks on phone: Not on file    Gets together: Not on file    Attends religious service: Not on file    Active member of club or organization: Not on file    Attends meetings of clubs or organizations: Not on file    Relationship status: Not on file  . Intimate partner violence:    Fear of current or ex partner: Not on file    Emotionally abused: Not on file    Physically abused: Not on file    Forced sexual activity: Not on file  Other Topics Concern  . Not on file  Social History Narrative  . Not on file    Review of Systems: Gen: No fever sweats chills positive weakness and fatigue HEENT: No visual complaints, No history of Retinopathy. Normal external appearance No Epistaxis or Sore throat. No sinusitis.   CV: Denies chest pain, angina, palpitations, syncope, some dyspnea on exertion Resp: Some dyspnea on completing sentences no cough wheeze or hemoptysis GI: Denies vomiting blood, jaundice, and fecal incontinence.  Denies dysphagia or odynophagia. GU : Denies urinary burning, blood in urine, urinary frequency, urinary hesitancy, nocturnal urination, and urinary incontinence.  No renal calculi. MS: Denies joint pain, limitation of movement, and swelling, stiffness, low back pain, extremity pain. Denies muscle weakness, cramps, atrophy.  No use of  non steroidal antiinflammatory drugs. Derm: Denies rash, itching, dry skin, hives, moles, warts, or unhealing ulcers.  Psych: Denies depression, anxiety, memory loss, suicidal ideation, hallucinations, paranoia, and confusion. Heme: Denies bruising, bleeding, and enlarged lymph nodes. Neuro: No headache.  No diplopia. No dysarthria.  No dysphasia.  No history of CVA.  No Seizures. No paresthesias.  No weakness. Endocrine No DM.  No Thyroid disease.  No Adrenal disease.  Physical Exam: Vital signs in last 24 hours: Temp:  [98.1 F (36.7 C)-98.4 F (36.9 C)] 98.4 F (36.9 C) (10/30 1258) Pulse Rate:  [58-62] 62 (10/30 1258) Resp:  [20] 20 (10/30 1258) BP: (165-173)/(47-51) 165/51 (10/30 1258) SpO2:  [95 %-98 %] 97 % (10/30 1258) Weight:  [83.8 kg] 83.8 kg (10/30 0606) Last BM Date: 10/11/18 General:   Frail elderly lady Head:  Normocephalic and atraumatic. Eyes:  Sclera clear, no icterus.   Conjunctiva pink. Ears:  Normal auditory acuity. Nose:  No deformity, discharge,  or lesions. Mouth:  No deformity or lesions, dentition normal. Neck:  Supple; no masses or thyromegaly. JVP not elevated Lungs: Some diminished breath sounds at the bases occasional rales on full inspiration Heart:  Regular rate and rhythm; no murmurs, clicks, rubs,  or gallops. Abdomen:  Soft, nontender and nondistended. No masses, hepatosplenomegaly or hernias noted. Normal bowel sounds, without guarding, and without rebound.   Msk:  Symmetrical without gross deformities. Normal posture. Pulses:  No carotid, renal, femoral bruits. DP and PT symmetrical and equal Extremities:  Without clubbing or edema.   Intake/Output from previous day: 10/29 0701 - 10/30 0700 In: 1680 [P.O.:1680] Out: 1850 [Urine:1850] Intake/Output this shift: No intake/output data recorded.  Lab Results: Recent Labs    10/11/18 1351 10/12/18 0655  WBC 9.2 7.9  HGB 8.1* 7.9*  HCT 27.6* 27.4*  PLT 299 267   BMET Recent Labs     10/11/18 1351 10/12/18 0448  NA 141 139  K 5.0 4.6  CL 110 107  CO2 22 20*  GLUCOSE 99 81  BUN 80* 80*  CREATININE 4.53* 4.31*  CALCIUM 9.2 8.8*  PHOS  --  6.1*   LFT Recent Labs    10/11/18 1351  PROT 7.6  ALBUMIN 3.8  AST 26  ALT 23  ALKPHOS 50  BILITOT 0.7   PT/INR No results for input(s): LABPROT, INR in the last 72 hours. Hepatitis Panel No results for input(s): HEPBSAG, HCVAB, HEPAIGM, HEPBIGM in the last 72 hours.  Studies/Results: US Renal  Result Date: 10/12/2018 CLINICAL DATA:  84 year old female with acute kidney insufficiency. Initial encounter. EXAM: RENAL / URINARY TRACT ULTRASOUND COMPLETE COMPARISON:  None. FINDINGS: Right Kidney: Length: 9.4 cm. Increased echogenicity of renal parenchyma. No hydronephrosis or mass. Left Kidney: Length: 8.9 cm. Difficult to visualize secondary to habitus/bowel gas. Increased echogenicity of renal parenchyma. No obvious hydronephrosis or mass. Bladder: Appears normal for degree of bladder distention. Bilateral pleural effusions. IMPRESSION: 1. Left kidney difficult to visualize secondary to habitus/bowel gas. Taking this limitation into account, no hydronephrosis is noted on either side. 2. Increased renal parenchyma echogenicity may reflect changes of medical renal disease. 3. Bilateral pleural effusions. Electronically Signed   By: Genia Del M.D.   On:  10/12/2018 12:59    Assessment/Plan:  Chronic renal insufficiency stage V this appears to be her baseline.  She is followed by Dr. Posey Pronto at [facility] and he is discussed kidney options with her.  She has decided not to proceed with preparations for dialysis.  The etiology of her chronic renal disease appears to be hypertension.  Hypertension multiple medications, difficult to control but suspect that she has an element of renal artery stenosis would avoid ACE inhibitors ARB's in this lady.  Appetite trait her current antihypertensive regimens and consider  switching her carvedilol to labetalol.  Anemia iron deficient agree with intravenous iron infusion.  She can start ESA and we will give her a shot of darbepoetin today.  Bones hyperphosphatemic recommend phosphate binders will start PhosLo 1 with meals  Volume.  Some diastolic dysfunction agree with IV Lasix we will keep her on another day of IV Lasix and transition to Bumex in the morning    LOS: [place] @TODAY @2 :12 PM

## 2018-10-14 LAB — BASIC METABOLIC PANEL
Anion gap: 11 (ref 5–15)
BUN: 89 mg/dL — AB (ref 8–23)
CHLORIDE: 107 mmol/L (ref 98–111)
CO2: 23 mmol/L (ref 22–32)
CREATININE: 4.16 mg/dL — AB (ref 0.44–1.00)
Calcium: 9.4 mg/dL (ref 8.9–10.3)
GFR calc Af Amer: 10 mL/min — ABNORMAL LOW (ref >60)
GFR calc non Af Amer: 9 mL/min — ABNORMAL LOW (ref >60)
GLUCOSE: 97 mg/dL (ref 70–99)
Potassium: 3.4 mmol/L — ABNORMAL LOW (ref 3.5–5.1)
SODIUM: 141 mmol/L (ref 135–145)

## 2018-10-14 MED ORDER — ALBUTEROL SULFATE (2.5 MG/3ML) 0.083% IN NEBU
2.5000 mg | INHALATION_SOLUTION | Freq: Two times a day (BID) | RESPIRATORY_TRACT | Status: DC
  Administered 2018-10-14 – 2018-10-15 (×2): 2.5 mg via RESPIRATORY_TRACT
  Filled 2018-10-14 (×2): qty 3

## 2018-10-14 MED ORDER — BUMETANIDE 1 MG PO TABS
2.0000 mg | ORAL_TABLET | Freq: Two times a day (BID) | ORAL | Status: DC
  Administered 2018-10-14 – 2018-10-15 (×3): 2 mg via ORAL
  Filled 2018-10-14 (×4): qty 2

## 2018-10-14 MED ORDER — POTASSIUM CHLORIDE CRYS ER 20 MEQ PO TBCR
20.0000 meq | EXTENDED_RELEASE_TABLET | Freq: Two times a day (BID) | ORAL | Status: DC
  Administered 2018-10-14 – 2018-10-15 (×3): 20 meq via ORAL
  Filled 2018-10-14 (×3): qty 1

## 2018-10-14 MED ORDER — CARVEDILOL 12.5 MG PO TABS
12.5000 mg | ORAL_TABLET | Freq: Two times a day (BID) | ORAL | Status: DC
  Administered 2018-10-14 – 2018-10-15 (×2): 12.5 mg via ORAL
  Filled 2018-10-14 (×2): qty 1

## 2018-10-14 NOTE — Progress Notes (Deleted)
Cardiology Office Note:    Date:  10/14/2018   ID:  Kathryn Peck, DOB 10/28/1928, MRN [id number]  PCP:  Kathryn Nip, MD  Cardiologist:  Kathryn More, MD    Referring MD: Kathryn Nip, MD    ASSESSMENT:    No diagnosis found. PLAN:    In order of problems listed above:  1. ***   Next appointment: ***   Medication Adjustments/Labs and Tests Ordered: Current medicines are reviewed at length with the patient today.  Concerns regarding medicines are outlined above.  No orders of the defined types were placed in this encounter.  No orders of the defined types were placed in this encounter.   No chief complaint on file.   History of Present Illness:    Kathryn Peck is a 84 y.o. female with a hx of aortic stenosis,mild mitral stenosis,moderate MR, chronic diastolic CHF, CKD 5, OSA on CPAP, COPD, hypertension, hyperlipidemia by Dr. Oval Peck in consultation 10/12/2018.   Compliance with diet, lifestyle and medications: ***   Ref Range & Units 2d ago 3d ago 8d ago  B Natriuretic Peptide 0.0 - 100.0 pg/mL 2,455.8High   2,897.9High  CM 1,597.2High  CM    Ref Range & Units 04:23 1d ago 2d ago  Sodium 135 - 145 mmol/L 141  140  139   Potassium 3.5 - 5.1 mmol/L 3.4Low   3.4Low  CM 4.6   Chloride 98 - 111 mmol/L 107  105  107   CO2 22 - 32 mmol/L 23  23  20Low    Glucose, Bld 70 - 99 mg/dL 97  162High   81   BUN 8 - 23 mg/dL 89High   88High   80High    Creatinine, Ser 0.44 - 1.00 mg/dL 4.16High   4.15High   4.31High    Calcium 8.9 - 10.3 mg/dL 9.4  9.1  8.8Low    GFR calc non Af Amer >60 mL/min 9Low   9Low   8Low    GFR calc Af Amer >60 mL/min 10Low   10Low  CM 10Low  CM    Past Medical History:  Diagnosis Date  . Acute diastolic CHF (congestive heart failure) ([place])   . Acute exacerbation of CHF (congestive heart failure) (Kathryn Peck) 10/11/2018  . Acute systolic CHF (congestive heart failure) (Kathryn Peck)   . Aortic valve stenosis 10/13/2018  . CHF (congestive  heart failure) (Kathryn Peck)   . CHF exacerbation (Kathryn Peck) 10/04/2018  . CKD (chronic kidney disease) stage 5, GFR less than 15 ml/min (HCC) 10/05/2018  . COPD with acute exacerbation (Kathryn Peck) 09/24/2018  . COPD with chronic bronchitis (Kathryn Peck) 10/04/2018  . Dyspnea 10/13/2018  . Essential hypertension 10/04/2018  . Heart murmur   . Hyperlipidemia 10/13/2018  . Hypervolemia   . Obesity (BMI 30-39.9) 10/04/2018  . OSA (obstructive sleep apnea) 09/24/2018  . Type 2 diabetes mellitus (Kathryn Peck) 10/13/2018    Past Surgical History:  Procedure Laterality Date  . CATARACT EXTRACTION    . EYE SURGERY    . TONSILLECTOMY      Current Medications: No outpatient medications have been marked as taking for the 10/15/18 encounter (Appointment) with Richardo Priest, MD.     Allergies:   Gelatin; Latex; Neomycin; Adhesive [tape]; Influenza vaccines; and Zetia [ezetimibe]   Social History   Socioeconomic History  . Marital status: Unknown    Spouse name: Not on file  . Number of children: Not on file  . Years of education: Not on file  . Highest  education level: Not on file  Occupational History  . Not on file  Social Needs  . Financial resource strain: Not on file  . Food insecurity:    Worry: Not on file    Inability: Not on file  . Transportation needs:    Medical: Not on file    Non-medical: Not on file  Tobacco Use  . Smoking status: Never Smoker  . Smokeless tobacco: Never Used  Substance and Sexual Activity  . Alcohol use: Never    Alcohol/week: 0.0 standard drinks    Frequency: Never  . Drug use: Never  . Sexual activity: Not on file  Lifestyle  . Physical activity:    Days per week: Not on file    Minutes per session: Not on file  . Stress: Not on file  Relationships  . Social connections:    Talks on phone: Not on file    Gets together: Not on file    Attends religious service: Not on file    Active member of club or organization: Not on file    Attends meetings of clubs or  organizations: Not on file    Relationship status: Not on file  Other Topics Concern  . Not on file  Social History Narrative  . Not on file     Family History: The patient's ***family history includes Heart disease in her father. ROS:   Please see the history of present illness.    All other systems reviewed and are negative.  EKGs/Labs/Other Studies Reviewed:    The following studies were reviewed today:  EKG:  EKG ordered today.  The ekg ordered today demonstrates ***  Recent Labs: 10/11/2018: ALT 23 10/12/2018: B Natriuretic Peptide 2,455.8; Hemoglobin 7.9; Magnesium 2.2; Platelets 267 10/14/2018: BUN 89; Creatinine, Ser 4.16; Potassium 3.4; Sodium 141  Recent Lipid Panel No results found for: CHOL, TRIG, HDL, CHOLHDL, VLDL, LDLCALC, LDLDIRECT  Physical Exam:    VS:  There were no vitals taken for this visit.    Wt Readings from Last 3 Encounters:  10/14/18 190 lb 7.6 oz (86.4 kg)  10/08/18 186 lb 11.7 oz (84.7 kg)  09/23/18 194 lb (88 kg)     GEN: *** Well nourished, well developed in no acute distress HEENT: Normal NECK: No JVD; No carotid bruits LYMPHATICS: No lymphadenopathy CARDIAC: ***RRR, no murmurs, rubs, gallops RESPIRATORY:  Clear to auscultation without rales, wheezing or rhonchi  ABDOMEN: Soft, non-tender, non-distended MUSCULOSKELETAL:  No edema; No deformity  SKIN: Warm and dry NEUROLOGIC:  Alert and oriented x 3 PSYCHIATRIC:  Normal affect    Signed, Kathryn More, MD  10/14/2018 2:34 PM    [place]

## 2018-10-14 NOTE — Progress Notes (Signed)
Physical Therapy Treatment Patient Details Name: Kathryn Peck MRN: [id number] DOB: 23-Sep-1928 Today's Date: 10/14/2018     SATURATION QUALIFICATIONS: (This note is used to comply with regulatory documentation for home oxygen)  Patient Saturations on Room Air at Rest = 93%  Patient Saturations on Room Air while Ambulating = 87%  Patient Saturations on 2 Liters of oxygen while Ambulating = 93%    History of Present Illness 84 yo female admitted with CHF exac. Hx of CHF, obesity, OSA, CKD. Pt is from an ALF    PT Comments    Progressing slowly with mobility. Pt walked ~20 feet x 2. Pt continues to fatigue easily. Ambulation distance limited by R knee pain and dyspnea. Encouraged pt to start calling out for NT to assist her to bathroom in order to increase activity. Discussed d/c plan-pt is planning to return to her ALF. If pt discharges to ALF, recommend HHPT f/u. Will continue to follow.     Follow Up Recommendations  SNF(however, pt/family planning to return to ALF)     Equipment Recommendations  None recommended by PT    Recommendations for Other Services       Precautions / Restrictions Precautions Precautions: Fall Precaution Comments: monitor O2 Restrictions Weight Bearing Restrictions: No    Mobility  Bed Mobility               General bed mobility comments: oob in recliner  Transfers Overall transfer level: Needs assistance Equipment used: 4-wheeled walker;Rolling walker (2 wheeled) Transfers: Sit to/from Stand Sit to Stand: Min assist         General transfer comment: x3. VCS safety, technique, hand placement. Increased time and effort. Small amount of assist to steady.   Ambulation/Gait Ambulation/Gait assistance: Min assist Gait Distance (Feet): 20 Feet(x2) Assistive device: Rolling walker (2 wheeled);4-wheeled walker Gait Pattern/deviations: Step-to pattern;Antalgic;Trunk flexed     General Gait Details: Attempted ambulation with  rollator however pt was unable due to painful R knee. Switched to standard RW for increased stability. Assist to stabilize throughout distance. O2 sat 87% on RA, 93% on 2L. Less dyspnea with oxygen on.    Stairs             Wheelchair Mobility    Modified Rankin (Stroke Patients Only)       Balance Overall balance assessment: Needs assistance         Standing balance support: Bilateral upper extremity supported Standing balance-Leahy Scale: Poor                              Cognition Arousal/Alertness: Awake/alert Behavior During Therapy: WFL for tasks assessed/performed Overall Cognitive Status: Within Functional Limits for tasks assessed                                        Exercises      General Comments        Pertinent Vitals/Pain Pain Assessment: No/denies pain    Home Living                      Prior Function            PT Goals (current goals can now be found in the care plan section) Progress towards PT goals: Progressing toward goals    Frequency    Min 3X/week  PT Plan Current plan remains appropriate    Co-evaluation              AM-PAC PT "6 Clicks" Daily Activity  Outcome Measure  Difficulty turning over in bed (including adjusting bedclothes, sheets and blankets)?: A Lot Difficulty moving from lying on back to sitting on the side of the bed? : Unable Difficulty sitting down on and standing up from a chair with arms (e.g., wheelchair, bedside commode, etc,.)?: Unable Help needed moving to and from a bed to chair (including a wheelchair)?: A Little Help needed walking in hospital room?: A Little Help needed climbing 3-5 steps with a railing? : Total 6 Click Score: 11    End of Session Equipment Utilized During Treatment: Oxygen;Gait belt Activity Tolerance: Patient limited by fatigue Patient left: in chair;with call bell/phone within reach;with chair alarm set;with  family/visitor present   PT Visit Diagnosis: Muscle weakness (generalized) (M62.81);Unsteadiness on feet (R26.81);Difficulty in walking, not elsewhere classified (R26.2)     Time: 0254-2706 PT Time Calculation (min) (ACUTE ONLY): 22 min  Charges:  $Gait Training: 8-22 mins                        Weston Anna, PT Acute Rehabilitation Services Pager: [phone] Office: [phone]

## 2018-10-14 NOTE — Progress Notes (Signed)
[facility]  PROGRESS NOTE  Kathryn Peck GXQ:[id number] DOB: 07/16/1928 DOA: 10/11/2018 PCP: Aretta Nip, MD   Brief HPI:   84 year old female was discharged 3 days ago for shortness of breath has significant history of chronic diastolic CHF, CKD stage V, obstructive sleep apnea on CPAP, COPD, hypertension, hyperlipidemia who came to the ED with shortness of breath.  At assisted facility patient was noted to be struggling to breathe.  O2 sats were 80s on rest and 70s on ambulation.    Subjective   Patient seen and examined, breathing is improved.   Assessment/Plan:     1. Acute exacerbation of chronic diastolic CHF-patient was started on IV Lasix which has been changed to Bumex 2 mg p.o. twice daily .  2. Acute kidney injury on CKD stage V-creatinine is slowly improving, today creatinine is 4.16.  Patient started on Bumex 2 mg p.o. twice daily check renal function in a.m.  Renal ultrasound shows no significant abnormality.    3. Obstructive sleep apnea-continue CPAP at night  4. Iron deficiency anemia-continue ferrous sulfate 325 mg daily  5. Moderate pulmonary hypertension-PA peak pressure 62 mmHg      CBC: Recent Labs  Lab 10/08/18 0517 10/11/18 1351 10/12/18 0655  WBC 8.8 9.2 7.9  NEUTROABS 6.1 6.7  --   HGB 7.6* 8.1* 7.9*  HCT 26.3* 27.6* 27.4*  MCV 94.9 96.2 97.2  PLT 287 299 144    Basic Metabolic Panel: Recent Labs  Lab 10/08/18 0517 10/11/18 1351 10/12/18 0448 10/13/18 1500 10/14/18 0423  NA 143 141 139 140 141  K 3.9 5.0 4.6 3.4* 3.4*  CL 110 110 107 105 107  CO2 22 22 20* 23 23  GLUCOSE 99 99 81 162* 97  BUN 79* 80* 80* 88* 89*  CREATININE 4.12* 4.53* 4.31* 4.15* 4.16*  CALCIUM 9.0 9.2 8.8* 9.1 9.4  MG 2.2  --  2.2  --   --   PHOS 5.9*  --  6.1*  --   --      DVT prophylaxis: SCDs  Code Status: DNR  Family Communication: Discussed with patient's daughter at bedside  Disposition Plan: likely home when medically ready  for discharge   Consultants:  Cardiology  Procedures:  None   Antibiotics:   Anti-infectives (From admission, onward)   None       Objective   Vitals:   10/13/18 2138 10/14/18 0556 10/14/18 0610 10/14/18 0842  BP: (!) 156/43 (!) 161/47    Pulse: 65 (!) 59    Resp: 18     Temp: 98.3 F (36.8 C) 99.2 F (37.3 C)    TempSrc: Oral Oral    SpO2: 96% 92%  97%  Weight:   86.4 kg   Height:        Intake/Output Summary (Last 24 hours) at 10/14/2018 1307 Last data filed at 10/14/2018 0600 Gross per 24 hour  Intake 240 ml  Output 700 ml  Net -460 ml   Filed Weights   10/12/18 0536 10/13/18 0606 10/14/18 0610  Weight: 83.8 kg 83.8 kg 86.4 kg     Physical Examination:    Mouth: Oral mucosa is moist, no lesions on palate,  Neck: Supple, no deformities, masses, or tenderness Lungs: Normal respiratory effort, bilateral clear to auscultation, no crackles or wheezes.  Heart: Regular rate and rhythm, S1 and S2 normal, no murmurs, rubs auscultated Abdomen: BS normoactive,soft,nondistended,non-tender to palpation,no organomegaly Extremities: No pretibial edema, no erythema, no cyanosis, no clubbing Neuro : Alert  and oriented to time, place and person, No focal deficits     Data Reviewed: I have personally reviewed following labs and imaging studies   No results found for this or any previous visit (from the past 240 hour(s)).   Liver Function Tests: Recent Labs  Lab 10/08/18 0517 10/11/18 1351  AST 12* 26  ALT 15 23  ALKPHOS 46 50  BILITOT 0.5 0.7  PROT 6.6 7.6  ALBUMIN 3.4* 3.8   No results for input(s): LIPASE, AMYLASE in the last 168 hours. No results for input(s): AMMONIA in the last 168 hours.  Cardiac Enzymes: Recent Labs  Lab 10/11/18 1351 10/11/18 1709 10/11/18 2238  TROPONINI 0.06* 0.05* 0.05*   BNP (last 3 results) Recent Labs    10/06/18 0544 10/11/18 1351 10/12/18 0448  BNP 1,597.2* 2,897.9* 2,455.8*    ProBNP (last 3  results) No results for input(s): PROBNP in the last 8760 hours.    Studies: No results found.  Scheduled Meds: . albuterol  2.5 mg Nebulization BID  . amLODipine  10 mg Oral Daily  . aspirin  81 mg Oral Daily  . bumetanide  2 mg Oral BID  . calcium acetate  667 mg Oral TID WC  . carvedilol  12.5 mg Oral BID WC  . feeding supplement (ENSURE ENLIVE)  237 mL Oral BID BM  . feeding supplement (NEPRO CARB STEADY)  237 mL Oral BID BM  . ferrous sulfate  325 mg Oral Q breakfast  . guaiFENesin  600 mg Oral BID  . heparin injection (subcutaneous)  5,000 Units Subcutaneous Q8H  . hydrALAZINE  100 mg Oral Q8H  . isosorbide mononitrate  30 mg Oral Daily  . latanoprost  1 drop Both Eyes QHS  . multivitamin with minerals   Oral Daily  . simvastatin  10 mg Oral QHS  . tiotropium  18 mcg Inhalation Daily  . Vitamin D (Ergocalciferol)  50,000 Units Oral Q7 days    Admission status: Inpatient: Based on patients clinical presentation and evaluation of above clinical data, I have made determination that patient meets Inpatient criteria at this time.  Patient admitted with acute on chronic respiratory failure due to diastolic heart failure.  Currently not at baseline.  Requiring oxygen by nasal cannula, IV diuresis with Lasix 60 mg daily 8 hours.  Time spent: 20 min  [facility] Pager [phone]. If 7PM-7AM, please contact night-coverage at [email or web address], Office  [phone]  password [place]  10/14/2018, 1:07 PM  LOS: 3 days

## 2018-10-14 NOTE — Consult Note (Signed)
   [facility] CM Inpatient Consult   10/14/2018  Kathryn Peck 23-Aug-1928 [id number]    Patient screened for potential [facility] Care Management services due to unplanned readmission risk score of 24% (high).  Chart reviewed and spoke with inpatient RNCM. Discharge plan likely to return to ALF. No identifiable [facility] Care Management needs at this time.   Marthenia Rolling, MSN-Ed, RN,BSN [facility] Liaison [phone]

## 2018-10-14 NOTE — Progress Notes (Addendum)
Ambulated patient with and without oxygen.  Oxygen saturation was 92-97% with 2L oxygen, but dropped to 83-87% on room air while ambulating.

## 2018-10-14 NOTE — Progress Notes (Signed)
[facility] ROUNDING NOTE   Subjective:   Still appears dyspneic had some desaturations on ambulation she continues to use oxygen.  Appreciate cardiology input.  Blood pressure 161/47 pulse 59 temperature 99.2 O2 sats 97% 2 L oxygen  Sodium 141 potassium 3.4 chloride 107 CO2 23 BUN 89 creatinine 1.16 glucose 97 calcium 9.4 last hemoglobin was 7.9 white blood count 7.9 platelets 267  Output 700 cc  Objective:  Vital signs in last 24 hours:  Temp:  [98.3 F (36.8 C)-99.2 F (37.3 C)] 99.2 F (37.3 C) (10/31 0556) Pulse Rate:  [59-65] 59 (10/31 0556) Resp:  [18] 18 (10/30 2138) BP: (156-166)/(43-63) 161/47 (10/31 0556) SpO2:  [92 %-98 %] 97 % (10/31 0842) Weight:  [86.4 kg] 86.4 kg (10/31 0610)  Weight change: 2.6 kg Filed Weights   10/12/18 0536 10/13/18 0606 10/14/18 0610  Weight: 83.8 kg 83.8 kg 86.4 kg    Intake/Output: I/O last 3 completed shifts: In: 1080 [P.O.:980; IV Piggyback:100] Out: 1700 [Urine:1700]   Intake/Output this shift:  No intake/output data recorded.  CVS- RRR JVP elevated RS- CTA decreased breath sounds and rales mostly left using oxygen ABD- BS present soft non-distended EXT- no edema   Basic Metabolic Panel: Recent Labs  Lab 10/08/18 0517 10/11/18 1351 10/12/18 0448 10/13/18 1500 10/14/18 0423  NA 143 141 139 140 141  K 3.9 5.0 4.6 3.4* 3.4*  CL 110 110 107 105 107  CO2 22 22 20* 23 23  GLUCOSE 99 99 81 162* 97  BUN 79* 80* 80* 88* 89*  CREATININE 4.12* 4.53* 4.31* 4.15* 4.16*  CALCIUM 9.0 9.2 8.8* 9.1 9.4  MG 2.2  --  2.2  --   --   PHOS 5.9*  --  6.1*  --   --     Liver Function Tests: Recent Labs  Lab 10/08/18 0517 10/11/18 1351  AST 12* 26  ALT 15 23  ALKPHOS 46 50  BILITOT 0.5 0.7  PROT 6.6 7.6  ALBUMIN 3.4* 3.8   No results for input(s): LIPASE, AMYLASE in the last 168 hours. No results for input(s): AMMONIA in the last 168 hours.  CBC: Recent Labs  Lab 10/08/18 0517 10/11/18 1351  10/12/18 0655  WBC 8.8 9.2 7.9  NEUTROABS 6.1 6.7  --   HGB 7.6* 8.1* 7.9*  HCT 26.3* 27.6* 27.4*  MCV 94.9 96.2 97.2  PLT 287 299 267    Cardiac Enzymes: Recent Labs  Lab 10/11/18 1351 10/11/18 1709 10/11/18 2238  TROPONINI 0.06* 0.05* 0.05*    BNP: Invalid input(s): POCBNP  CBG: No results for input(s): GLUCAP in the last 168 hours.  Microbiology: No results found for this or any previous visit.  Coagulation Studies: No results for input(s): LABPROT, INR in the last 72 hours.  Urinalysis: No results for input(s): COLORURINE, LABSPEC, PHURINE, GLUCOSEU, HGBUR, BILIRUBINUR, KETONESUR, PROTEINUR, UROBILINOGEN, NITRITE, LEUKOCYTESUR in the last 72 hours.  Invalid input(s): APPERANCEUR    Imaging: No results found.   Medications:    . albuterol  2.5 mg Nebulization BID  . amLODipine  10 mg Oral Daily  . aspirin  81 mg Oral Daily  . bumetanide  2 mg Oral BID  . calcium acetate  667 mg Oral TID WC  . carvedilol  12.5 mg Oral BID WC  . feeding supplement (ENSURE ENLIVE)  237 mL Oral BID BM  . feeding supplement (NEPRO CARB STEADY)  237 mL Oral BID BM  . ferrous sulfate  325 mg Oral Q  breakfast  . guaiFENesin  600 mg Oral BID  . heparin injection (subcutaneous)  5,000 Units Subcutaneous Q8H  . hydrALAZINE  100 mg Oral Q8H  . isosorbide mononitrate  30 mg Oral Daily  . latanoprost  1 drop Both Eyes QHS  . multivitamin with minerals   Oral Daily  . simvastatin  10 mg Oral QHS  . tiotropium  18 mcg Inhalation Daily  . Vitamin D (Ergocalciferol)  50,000 Units Oral Q7 days   acetaminophen, colesevelam, hydrALAZINE, levalbuterol, ondansetron (ZOFRAN) IV  Assessment/ Plan:   Chronic renal failure failure stage V follow-up with Dr. Posey Pronto not candidate for dialysis again discussed with daughter in room and have reemphasized that she is not a dialysis candidate etiology of the renal failure appears to be hypertension probably has an element of renal artery stenosis  avoid ACE inhibitors avoid ARB's avoid contrast.  Volume she has some diastolic dysfunction she is diuresing with IV Lasix not that much diuresis with current dose of diuretics however she is transition to oral Bumex in anticipation for discharge.  She could be discharged home on oxygen therapy.  Anemia will need follow-up with Dr. Posey Pronto [phone] at [facility].  She has been receiving IV iron  Bones she had hyperphosphatemia and started PhosLo 1 with meals.  Follow-up with Dr. Posey Pronto   LOS: [place] @TODAY @1 :21 PM

## 2018-10-14 NOTE — Progress Notes (Signed)
Progress Note  Patient Name: Kathryn Peck Date of Encounter: 10/14/2018  Primary Cardiologist: Skeet Latch, MD   Subjective   The patient is sitting up in a chair, states that she feels well.  Her breathing continues to improve.  Lower extremity edema is much better than on admission, still very mildly present.  She has not been up out of the bed/chair to see how she tolerates activity.  Inpatient Medications    Scheduled Meds: . albuterol  2.5 mg Nebulization BID  . amLODipine  10 mg Oral Daily  . aspirin  81 mg Oral Daily  . calcium acetate  667 mg Oral TID WC  . feeding supplement (ENSURE ENLIVE)  237 mL Oral BID BM  . feeding supplement (NEPRO CARB STEADY)  237 mL Oral BID BM  . ferrous sulfate  325 mg Oral Q breakfast  . guaiFENesin  600 mg Oral BID  . heparin injection (subcutaneous)  5,000 Units Subcutaneous Q8H  . hydrALAZINE  100 mg Oral Q8H  . isosorbide mononitrate  30 mg Oral Daily  . latanoprost  1 drop Both Eyes QHS  . metoprolol succinate  25 mg Oral Daily  . multivitamin with minerals   Oral Daily  . simvastatin  10 mg Oral QHS  . tiotropium  18 mcg Inhalation Daily  . Vitamin D (Ergocalciferol)  50,000 Units Oral Q7 days   Continuous Infusions:  PRN Meds: acetaminophen, colesevelam, hydrALAZINE, levalbuterol, ondansetron (ZOFRAN) IV   Vital Signs    Vitals:   10/13/18 2138 10/14/18 0556 10/14/18 0610 10/14/18 0842  BP: (!) 156/43 (!) 161/47    Pulse: 65 (!) 59    Resp: 18     Temp: 98.3 F (36.8 C) 99.2 F (37.3 C)    TempSrc: Oral Oral    SpO2: 96% 92%  97%  Weight:   86.4 kg   Height:        Intake/Output Summary (Last 24 hours) at 10/14/2018 0938 Last data filed at 10/14/2018 0600 Gross per 24 hour  Intake 580 ml  Output 700 ml  Net -120 ml   Filed Weights   10/12/18 0536 10/13/18 0606 10/14/18 0610  Weight: 83.8 kg 83.8 kg 86.4 kg    Telemetry    Sinus rhythm in the 60s with occasional PVCs and rare bigeminy-  Personally Reviewed  ECG    No new tracings- Personally Reviewed  Physical Exam   GEN:  Obese female, no acute distress.   Neck: No JVD Cardiac: RRR, harsh 2/6 systolic murmur at right and left upper sternal border Respiratory: Clear to auscultation bilaterally. GI: Soft, nontender, non-distended  MS: No edema; No deformity. Neuro:  Nonfocal  Psych: Normal affect   Labs    Chemistry Recent Labs  Lab 10/08/18 0517 10/11/18 1351 10/12/18 0448 10/13/18 1500 10/14/18 0423  NA 143 141 139 140 141  K 3.9 5.0 4.6 3.4* 3.4*  CL 110 110 107 105 107  CO2 22 22 20* 23 23  GLUCOSE 99 99 81 162* 97  BUN 79* 80* 80* 88* 89*  CREATININE 4.12* 4.53* 4.31* 4.15* 4.16*  CALCIUM 9.0 9.2 8.8* 9.1 9.4  PROT 6.6 7.6  --   --   --   ALBUMIN 3.4* 3.8  --   --   --   AST 12* 26  --   --   --   ALT 15 23  --   --   --   ALKPHOS 46 50  --   --   --  BILITOT 0.5 0.7  --   --   --   GFRNONAA 9* 8* 8* 9* 9*  GFRAA 10* 9* 10* 10* 10*  ANIONGAP 11 9 12 12 11      Hematology Recent Labs  Lab 10/08/18 0517 10/11/18 1351 10/12/18 0655  WBC 8.8 9.2 7.9  RBC 2.77*  2.77* 2.87* 2.82*  HGB 7.6* 8.1* 7.9*  HCT 26.3* 27.6* 27.4*  MCV 94.9 96.2 97.2  MCH 27.4 28.2 28.0  MCHC 28.9* 29.3* 28.8*  RDW 17.6* 17.9* 17.5*  PLT 287 299 267    Cardiac Enzymes Recent Labs  Lab 10/11/18 1351 10/11/18 1709 10/11/18 2238  TROPONINI 0.06* 0.05* 0.05*   No results for input(s): TROPIPOC in the last 168 hours.   BNP Recent Labs  Lab 10/11/18 1351 10/12/18 0448  BNP 2,897.9* 2,455.8*     DDimer No results for input(s): DDIMER in the last 168 hours.   Radiology    US Renal  Result Date: 10/12/2018 CLINICAL DATA:  84 year old female with acute kidney insufficiency. Initial encounter. EXAM: RENAL / URINARY TRACT ULTRASOUND COMPLETE COMPARISON:  None. FINDINGS: Right Kidney: Length: 9.4 cm. Increased echogenicity of renal parenchyma. No hydronephrosis or mass. Left Kidney: Length: 8.9 cm.  Difficult to visualize secondary to habitus/bowel gas. Increased echogenicity of renal parenchyma. No obvious hydronephrosis or mass. Bladder: Appears normal for degree of bladder distention. Bilateral pleural effusions. IMPRESSION: 1. Left kidney difficult to visualize secondary to habitus/bowel gas. Taking this limitation into account, no hydronephrosis is noted on either side. 2. Increased renal parenchyma echogenicity may reflect changes of medical renal disease. 3. Bilateral pleural effusions. Electronically Signed   By: Genia Del M.D.   On: 10/12/2018 12:59    Cardiac Studies   Echocardiogram 10/05/2018 Study Conclusions - Left ventricle: The cavity size was normal. There was mild   concentric hypertrophy. Systolic function was normal. The   estimated ejection fraction was in the range of 55% to 60%. Wall   motion was normal; there were no regional wall motion   abnormalities. Features are consistent with a pseudonormal left   ventricular filling pattern, with concomitant abnormal relaxation   and increased filling pressure (grade 2 diastolic dysfunction). - Aortic valve: Cusp separation was moderately reduced. Noncoronary   cusp mobility was severely restricted. There was moderate   stenosis. There was trivial regurgitation. Valve area (VTI): 1.13   cm^2. Valve area (Vmax): 1.31 cm^2. Valve area (Vmean): 1.21   cm^2. - Mitral valve: Severely calcified annulus. Mildly thickened   leaflets . The findings are consistent with mild stenosis. There   was moderate regurgitation directed eccentrically and   posteriorly. Mean gradient (D): 6 mm Hg. Gradients at 60 bpm.   Valve area by continuity equation (using LVOT flow): 1.39 cm^2. - Left atrium: The atrium was severely dilated. - Right ventricle: The cavity size was mildly dilated. - Right atrium: The atrium was moderately dilated. - Tricuspid valve: There was mild-moderate regurgitation directed   centrally. - Pulmonary arteries:  Systolic pressure was moderately increased.   PA peak pressure: 62 mm Hg (S).   Patient Profile     84 y.o. female with a hx of chronic diastolic heart failure, moderate aortic stenosis, CKD stage 5, OSA on CPAP, COPD, hypertension, and HLDwho is being seen today for the evaluation of SOB and hypertension.  Assessment & Plan    Acute on chronic diastolic heart failure -Diuresing with Lasix 60 mg IV 3 times daily.  -Only 700 mL of urine  output recorded for yesterday.  Patient is net -1.2 L fluid balance. Wt was was documented at 190.5 pounds but I had her re-weight she was 185 pounds, which is stable from yesterday. -Patient reports breathing is good and she had no orthopnea last night.  Still has mild ankle edema, much better than on admission. -We will transition to Bumex 2 mg twice daily -Patient encouraged to get up and move around today.  She has not been up out of the bed/chair.  May need physical therapy which has been ordered. -Monitor fluid status on oral diuretic.  May be ready for discharge tomorrow.  Hypertension Hydralazine increased to 100 mg p.o. 3 times daily for better blood pressure control. BP 161/47, still elevated  CKD stage 5 -Serum creatinine improving with diuresis, 4.53 >>4.16  OSA -Continue CPAP at at bedtime  Aortic stenosis -Moderate by echo on 10/05/2018, mean gradient 24 mmHg  Mitral valve disease -Mild MS by echo, with moderate regurgitation  Troponin elevation -Mild and flat pattern consistent with demand ischemia in the setting of anemia, CHF and hypertension     For questions or updates, please contact [facility] Please consult [email or web address] for contact info under        Signed, Daune Perch, NP  10/14/2018, 9:38 AM

## 2018-10-15 ENCOUNTER — Ambulatory Visit: Payer: Medicare Other | Admitting: Cardiology

## 2018-10-15 DIAGNOSIS — N179 Acute kidney failure, unspecified: Secondary | ICD-10-CM

## 2018-10-15 LAB — BASIC METABOLIC PANEL
Anion gap: 11 (ref 5–15)
BUN: 87 mg/dL — ABNORMAL HIGH (ref 8–23)
CHLORIDE: 107 mmol/L (ref 98–111)
CO2: 23 mmol/L (ref 22–32)
CREATININE: 4.02 mg/dL — AB (ref 0.44–1.00)
Calcium: 9.5 mg/dL (ref 8.9–10.3)
GFR calc non Af Amer: 9 mL/min — ABNORMAL LOW (ref >60)
GFR, EST AFRICAN AMERICAN: 11 mL/min — AB (ref >60)
Glucose, Bld: 91 mg/dL (ref 70–99)
Potassium: 4.2 mmol/L (ref 3.5–5.1)
SODIUM: 141 mmol/L (ref 135–145)

## 2018-10-15 MED ORDER — CALCIUM ACETATE (PHOS BINDER) 667 MG PO CAPS: 667.0000 mg | ORAL_CAPSULE | Freq: Three times a day (TID) | ORAL | 2 refills | Status: DC

## 2018-10-15 MED ORDER — FERROUS SULFATE 325 (65 FE) MG PO TABS: 325.0000 mg | ORAL_TABLET | Freq: Every day | ORAL | 3 refills | Status: DC

## 2018-10-15 MED ORDER — HYDRALAZINE HCL 100 MG PO TABS: 100.0000 mg | ORAL_TABLET | Freq: Three times a day (TID) | ORAL | 2 refills | Status: DC

## 2018-10-15 MED ORDER — CARVEDILOL 12.5 MG PO TABS: 12.5000 mg | ORAL_TABLET | Freq: Two times a day (BID) | ORAL | 2 refills | Status: DC

## 2018-10-15 NOTE — Progress Notes (Signed)
Report given to Audiological scientist at [employer]

## 2018-10-15 NOTE — Discharge Summary (Signed)
Physician Discharge Summary  Kathryn Peck BHA:[id number] DOB: 04-14-1928 DOA: 10/11/2018  PCP: Aretta Nip, MD  Admit date: 10/11/2018 Discharge date: 10/15/2018  Time spent: 35 minutes  Recommendations for Outpatient Follow-up:  1. Patient will need 2 L/min oxygen via nasal canula 2. Follow up cardiology in 2 weeks  Discharge Diagnoses:  Active Problems:   COPD with acute exacerbation ([place])   Essential hypertension   COPD with chronic bronchitis (HCC)   Obesity (BMI 30-39.9)   CKD (chronic kidney disease) stage 5, GFR less than 15 ml/min (HCC)   Acute diastolic CHF (congestive heart failure) (HCC)   Acute exacerbation of CHF (congestive heart failure) ([place])   Discharge Condition: Stable  Diet recommendation: Heart healthy diet  Filed Weights   10/13/18 0606 10/14/18 0610 10/15/18 0451  Weight: 83.8 kg 86.4 kg 84.4 kg    History of present illness:  84 year old female was discharged 3 days ago for shortness of breath has significant history of chronic diastolic CHF, CKD stage V, obstructive sleep apnea on CPAP, COPD, hypertension, hyperlipidemia who came to the ED with shortness of breath.  At assisted facility patient was noted to be struggling to breathe.  O2 sats were 80s on rest and 70s on ambulation.  Hospital Course:   1. Acute exacerbation of chronic diastolic CHF-patient was started on IV Lasix which has been changed to Bumex 2 mg p.o. twice daily .Cardiology has signed off, will follow as outpatient. Started on Coreg, metoprolol has been discontinued.  2. Acute kidney injury on CKD stage V-creatinine is slowly improving, today creatinine is 4.16.  Patient started on Bumex 2 mg p.o. twice daily , Cr has improved to 4.02.  Renal ultrasound shows no significant abnormality.    3. Obstructive sleep apnea-continue CPAP at night  4. Iron deficiency anemia-continue ferrous sulfate 325 mg daily  5. Moderate pulmonary hypertension-PA peak pressure 62  mmHg  Procedures:  None   Consultations:  Cardiology   Discharge Exam: Vitals:   10/15/18 0451 10/15/18 0900  BP: (!) 155/48   Pulse: (!) 58   Resp: 16   Temp: 98.6 F (37 C)   SpO2: 94% 95%    General: Appears in no acute distress Cardiovascular: S1S2 RRR Respiratory: Clear to auscultation bilaterally  Discharge Instructions   Discharge Instructions    Diet - low sodium heart healthy   Complete by:  As directed    Increase activity slowly   Complete by:  As directed      Allergies as of 10/15/2018      Reactions   Gelatin    Unknown, listed on MAR   Latex Itching   Neomycin    Unknown, listed on MAR   Adhesive [tape] Rash   Influenza Vaccines Nausea And Vomiting   Vaccine made her "deathly sick".  She does not take them at all   Zetia [ezetimibe] Other (See Comments)   Unknown      Medication List    STOP taking these medications   metoprolol succinate 50 MG 24 hr tablet Commonly known as:  TOPROL-XL     TAKE these medications   acetaminophen 325 MG tablet Commonly known as:  TYLENOL Take 650 mg by mouth 2 (two) times daily.   amLODipine 10 MG tablet Commonly known as:  NORVASC Take 10 mg by mouth every morning.   aspirin 81 MG chewable tablet Chew 81 mg by mouth every morning.   bumetanide 2 MG tablet Commonly known as:  BUMEX Take 1 tablet (  2 mg total) by mouth 2 (two) times daily.   calcium acetate 667 MG capsule Commonly known as:  PHOSLO Take 1 capsule (667 mg total) by mouth 3 (three) times daily with meals.   carvedilol 12.5 MG tablet Commonly known as:  COREG Take 1 tablet (12.5 mg total) by mouth 2 (two) times daily with a meal.   CENTRUM SILVER ULTRA WOMENS PO Take 1 tablet by mouth every morning.   colesevelam 625 MG tablet Commonly known as:  WELCHOL Take 625 mg by mouth 2 (two) times daily as needed (Cholesterol).   ferrous sulfate 325 (65 FE) MG tablet Take 1 tablet (325 mg total) by mouth daily with  breakfast. Start taking on:  10/16/2018   guaiFENesin 600 MG 12 hr tablet Commonly known as:  MUCINEX Take 1 tablet (600 mg total) by mouth 2 (two) times daily for 7 days.   hydrALAZINE 100 MG tablet Commonly known as:  APRESOLINE Take 1 tablet (100 mg total) by mouth every 8 (eight) hours. What changed:    medication strength  how much to take   isosorbide mononitrate 30 MG 24 hr tablet Commonly known as:  IMDUR Take 30 mg by mouth daily.   latanoprost 0.005 % ophthalmic solution Commonly known as:  XALATAN Place 1 drop into both eyes at bedtime.   levalbuterol 0.63 MG/3ML nebulizer solution Commonly known as:  XOPENEX Take 3 mLs (0.63 mg total) by nebulization every 6 (six) hours as needed for wheezing or shortness of breath.   loperamide 2 MG tablet Commonly known as:  IMODIUM A-D Take 2 mg by mouth 2 (two) times daily as needed for diarrhea or loose stools.   OSTEO BI-FLEX JOINT SHIELD PO Take 1 tablet by mouth 2 (two) times daily.   potassium chloride SA 20 MEQ tablet Commonly known as:  K-DUR,KLOR-CON Take 1 tablet (20 mEq total) by mouth 2 (two) times daily.   PROBIOTIC COLON SUPPORT PO Take 1 capsule by mouth every morning.   simvastatin 10 MG tablet Commonly known as:  ZOCOR Take 10 mg by mouth at bedtime.   Tiotropium Bromide Monohydrate 2.5 MCG/ACT Aers Inhale 2 puffs into the lungs daily.   Vitamin D (Ergocalciferol) 50000 units Caps capsule Commonly known as:  DRISDOL Take 50,000 Units by mouth every 7 (seven) days. On Thursdays      Allergies  Allergen Reactions  . Gelatin     Unknown, listed on MAR  . Latex Itching  . Neomycin     Unknown, listed on MAR  . Adhesive [Tape] Rash  . Influenza Vaccines Nausea And Vomiting    Vaccine made her "deathly sick".  She does not take them at all  . Zetia [Ezetimibe] Other (See Comments)    Unknown      The results of significant diagnostics from this hospitalization (including imaging,  microbiology, ancillary and laboratory) are listed below for reference.    Significant Diagnostic Studies: Dg Chest 2 View  Result Date: 10/11/2018 CLINICAL DATA:  Shortness of breath. EXAM: CHEST - 2 VIEW COMPARISON:  Radiograph of October 08, 2018. FINDINGS: Stable cardiomegaly. Atherosclerosis of thoracic aorta is noted. No pneumothorax is noted. Small bilateral pleural effusions are noted. No consolidative process is noted. Bony thorax is unremarkable. IMPRESSION: Minimal bilateral pleural effusions. No other significant acute abnormality seen. Aortic Atherosclerosis (ICD10-I70.0). Electronically Signed   By: Marijo Conception, M.D.   On: 10/11/2018 13:39   Dg Chest 2 View  Result Date: 10/04/2018 CLINICAL DATA:  Shortness of breath. EXAM: CHEST - 2 VIEW COMPARISON:  None. FINDINGS: Borderline cardiomegaly considering the AP technique. Aortic atherosclerosis. The main pulmonary artery is prominent. Dense calcification in the mitral valve annulus. No infiltrates or effusions. No acute bone abnormality. IMPRESSION: Borderline heart size.  Prominent main pulmonary artery. Aortic Atherosclerosis (ICD10-I70.0). Electronically Signed   By: Lorriane Shire M.D.   On: 10/04/2018 12:48   Dg Knee 1-2 Views Right  Result Date: 10/07/2018 CLINICAL DATA:  Diffuse right knee pain while walking EXAM: RIGHT KNEE - 1-2 VIEW COMPARISON:  None. FINDINGS: Tricompartmental degenerative changes are noted most marked in the medial joint space. No joint effusion is seen. No acute fracture or dislocation is noted. IMPRESSION: Degenerative change without acute abnormality. Electronically Signed   By: Inez Catalina M.D.   On: 10/07/2018 14:58   US Renal  Result Date: 10/12/2018 CLINICAL DATA:  84 year old female with acute kidney insufficiency. Initial encounter. EXAM: RENAL / URINARY TRACT ULTRASOUND COMPLETE COMPARISON:  None. FINDINGS: Right Kidney: Length: 9.4 cm. Increased echogenicity of renal parenchyma. No  hydronephrosis or mass. Left Kidney: Length: 8.9 cm. Difficult to visualize secondary to habitus/bowel gas. Increased echogenicity of renal parenchyma. No obvious hydronephrosis or mass. Bladder: Appears normal for degree of bladder distention. Bilateral pleural effusions. IMPRESSION: 1. Left kidney difficult to visualize secondary to habitus/bowel gas. Taking this limitation into account, no hydronephrosis is noted on either side. 2. Increased renal parenchyma echogenicity may reflect changes of medical renal disease. 3. Bilateral pleural effusions. Electronically Signed   By: Genia Del M.D.   On: 10/12/2018 12:59   Dg Chest Port 1 View  Result Date: 10/08/2018 CLINICAL DATA:  Shortness of breath. EXAM: PORTABLE CHEST 1 VIEW COMPARISON:  10/04/2018 FINDINGS: Lungs are adequately inflated with mild hazy opacification over the right base which may be due to atelectasis or infection. No effusion. Prominence of the right hilum and prominent main pulmonary artery segment. Cardiomediastinal silhouette and remainder of the exam is unchanged. IMPRESSION: Mild hazy right base opacification which may be due to atelectasis or infection. Prominent main pulmonary artery segment which may be due to pulmonary arterial hypertension. Prominent right hilum which also may be part of pulmonary arterial hypertension, although mass or adenopathy is possible. Recommend contrast and chest CT on elective basis for further evaluation. Electronically Signed   By: Marin Olp M.D.   On: 10/08/2018 09:56   Vas [place] Lower Extremity Venous (dvt)  Result Date: 10/07/2018  Lower Venous Study Indications: Swelling.  Limitations: Body habitus and poor ultrasound/tissue interface. Performing Technologist: Oliver Hum RVT  Examination Guidelines: A complete evaluation includes B-mode imaging, spectral Doppler, color Doppler, and power Doppler as needed of all accessible portions of each vessel. Bilateral testing is considered an  integral part of a complete examination. Limited examinations for reoccurring indications may be performed as noted.  Right Venous Findings: +---------+---------------+---------+-----------+----------+-------+          CompressibilityPhasicitySpontaneityPropertiesSummary +---------+---------------+---------+-----------+----------+-------+ CFV      Full           Yes      Yes                          +---------+---------------+---------+-----------+----------+-------+ SFJ      Full                                                 +---------+---------------+---------+-----------+----------+-------+  FV Prox  Full                                                 +---------+---------------+---------+-----------+----------+-------+ FV Mid   Full                                                 +---------+---------------+---------+-----------+----------+-------+ FV DistalFull                                                 +---------+---------------+---------+-----------+----------+-------+ PFV      Full                                                 +---------+---------------+---------+-----------+----------+-------+ POP      Full           Yes      Yes                          +---------+---------------+---------+-----------+----------+-------+ PTV      Full                                                 +---------+---------------+---------+-----------+----------+-------+ PERO     Full                                                 +---------+---------------+---------+-----------+----------+-------+  Left Venous Findings: +---------+---------------+---------+-----------+----------+-------+          CompressibilityPhasicitySpontaneityPropertiesSummary +---------+---------------+---------+-----------+----------+-------+ CFV      Full           Yes      Yes                           +---------+---------------+---------+-----------+----------+-------+ SFJ      Full                                                 +---------+---------------+---------+-----------+----------+-------+ FV Prox  Full                                                 +---------+---------------+---------+-----------+----------+-------+ FV Mid   Full                                                 +---------+---------------+---------+-----------+----------+-------+  FV DistalFull                                                 +---------+---------------+---------+-----------+----------+-------+ PFV      Full                                                 +---------+---------------+---------+-----------+----------+-------+ POP      Full           Yes      Yes                          +---------+---------------+---------+-----------+----------+-------+ PTV      Full                                                 +---------+---------------+---------+-----------+----------+-------+ PERO     Full                                                 +---------+---------------+---------+-----------+----------+-------+    Summary: Right: There is no evidence of deep vein thrombosis in the lower extremity. No cystic structure found in the popliteal fossa. Left: There is no evidence of deep vein thrombosis in the lower extremity. No cystic structure found in the popliteal fossa.  *See table(s) above for measurements and observations. Electronically signed by Servando Snare MD on 10/07/2018 at 1:09:03 PM.    Final     Microbiology: No results found for this or any previous visit (from the past 240 hour(s)).   Labs: Basic Metabolic Panel: Recent Labs  Lab 10/11/18 1351 10/12/18 0448 10/13/18 1500 10/14/18 0423 10/15/18 0440  NA 141 139 140 141 141  K 5.0 4.6 3.4* 3.4* 4.2  CL 110 107 105 107 107  CO2 22 20* 23 23 23   GLUCOSE 99 81 162* 97 91  BUN 80* 80* 88* 89* 87*   CREATININE 4.53* 4.31* 4.15* 4.16* 4.02*  CALCIUM 9.2 8.8* 9.1 9.4 9.5  MG  --  2.2  --   --   --   PHOS  --  6.1*  --   --   --    Liver Function Tests: Recent Labs  Lab 10/11/18 1351  AST 26  ALT 23  ALKPHOS 50  BILITOT 0.7  PROT 7.6  ALBUMIN 3.8   No results for input(s): LIPASE, AMYLASE in the last 168 hours. No results for input(s): AMMONIA in the last 168 hours. CBC: Recent Labs  Lab 10/11/18 1351 10/12/18 0655  WBC 9.2 7.9  NEUTROABS 6.7  --   HGB 8.1* 7.9*  HCT 27.6* 27.4*  MCV 96.2 97.2  PLT 299 267   Cardiac Enzymes: Recent Labs  Lab 10/11/18 1351 10/11/18 1709 10/11/18 2238  TROPONINI 0.06* 0.05* 0.05*   BNP: BNP (last 3 results) Recent Labs    10/06/18 0544 10/11/18 1351 10/12/18 0448  BNP 1,597.2* 2,897.9* 2,455.8*    ProBNP (last 3 results) No  results for input(s): PROBNP in the last 8760 hours.  CBG: No results for input(s): GLUCAP in the last 168 hours.     Signed:  Oswald Hillock MD.  [facility] 10/15/2018, 11:43 AM

## 2018-10-15 NOTE — Clinical Social Work Note (Signed)
Clinical Social Work Assessment  Patient Details  Name: Kathryn Peck MRN: [id number] Date of Birth: Jul 27, 1928  Date of referral:  10/15/18               Reason for consult:  Facility Placement, Discharge Planning                Permission sought to share information with:  Family Supports, Customer service manager Permission granted to share information::  Yes, Verbal Permission Granted  Name::     Kathryn Peck  Agency::  [facility]  Relationship::  daughter  Contact Information:  [phone]  Housing/Transportation Living arrangements for the past 2 months:  [place] of Information:  Patient, Facility Patient Interpreter Needed:  None Criminal Activity/Legal Involvement Pertinent to Current Situation/Hospitalization:  No - Comment as needed Significant Relationships:  Adult Children Lives with:  Facility Resident Do you feel safe going back to the place where you live?  Yes Need for family participation in patient care:  No (Coment)  Care giving concerns:  Patient is from [facility] and will be going back after discharge. Physical Therapy recommending SNF but family is wanting patient to return back to ALF   Social Worker assessment / plan:  CSW spoke with patients daughter Kathryn Peck via phone. Kathryn Peck stated she is agreeable with patient to return back to [facility]. Kathryn Peck stated she will transport her mother back to facility herself. CSW made facility aware that patient will be coming back today. Facility stated they are able to take her back  Employment status:  Retired Forensic scientist:  Medicare PT Recommendations:  [place] / Referral to community resources:  Skilled Nursing Facility(family decline snf)  Patient/Family's Response to care:  Family appreciates the care patient has received during her stay  Patient/Family's Understanding of and Emotional Response to Diagnosis,  Current Treatment, and Prognosis:  Family wanting patient to return back to [facility]  Emotional Assessment Appearance:  Appears stated age Attitude/Demeanor/Rapport:  Unable to Assess Affect (typically observed):  Unable to Assess Orientation:  Oriented to Self, Oriented to Place, Oriented to  Time, Oriented to Situation Alcohol / Substance use:  Not Applicable Psych involvement (Current and /or in the community):  No (Comment)  Discharge Needs  Concerns to be addressed:  Care Coordination Readmission within the last 30 days:  Yes Current discharge risk:  Dependent with Mobility Barriers to Discharge:  Continued Medical Work up   [employer], LCSW 10/15/2018, 10:31 AM

## 2018-10-15 NOTE — NC FL2 (Signed)
[place] LEVEL OF CARE SCREENING TOOL     IDENTIFICATION  Patient Name: Kathryn Peck Birthdate: 1928/12/02 Sex: female Admission Date (Current Location): 10/11/2018  [facility] and [place] Number:  Herbalist and Address:  [facility],  [address], [place]      Provider Number: [id number]  Attending Physician Name and Address:  Oswald Hillock, MD  Relative Name and Phone Number:  Deretha Emory: [phone]    Current Level of Care: Hospital Recommended Level of Care: [place] Prior Approval Number:    Date Approved/Denied:   PASRR Number:    Discharge Plan: Other (Comment)([place])    Current Diagnoses: Patient Active Problem List   Diagnosis Date Noted  . Dyspnea 10/13/2018  . Aortic valve stenosis 10/13/2018  . Hyperlipidemia 10/13/2018  . Type 2 diabetes mellitus ([place]) 10/13/2018  . Acute exacerbation of CHF (congestive heart failure) ([place]) 10/11/2018  . CKD (chronic kidney disease) stage 5, GFR less than 15 ml/min (HCC) 10/05/2018  . Hypervolemia   . Acute systolic CHF (congestive heart failure) ([place])   . Acute diastolic CHF (congestive heart failure) ([place])   . CHF exacerbation ([place]) 10/04/2018  . Essential hypertension 10/04/2018  . COPD with chronic bronchitis ([place]) 10/04/2018  . Obesity (BMI 30-39.9) 10/04/2018  . OSA (obstructive sleep apnea) 09/24/2018  . COPD with acute exacerbation ([place]) 09/24/2018    Orientation RESPIRATION BLADDER Height & Weight     Self, Time, Situation, Place  O2(2L) External catheter, Continent Weight: 186 lb 1.1 oz (84.4 kg) Height:  5\' 3"  (160 cm)  BEHAVIORAL SYMPTOMS/MOOD NEUROLOGICAL BOWEL NUTRITION STATUS      Continent Diet(renal)  AMBULATORY STATUS COMMUNICATION OF NEEDS Skin   Limited Assist Verbally Normal                       Personal Care Assistance Level of Assistance  Bathing, Feeding, Dressing Bathing Assistance: Limited  assistance Feeding assistance: Independent Dressing Assistance: Limited assistance     Functional Limitations Info  Sight, Hearing, Speech Sight Info: Adequate Hearing Info: Adequate Speech Info: Adequate    SPECIAL CARE FACTORS FREQUENCY  PT (By licensed PT), OT (By licensed OT)     PT Frequency: HH PT OT Frequency: HH OT            Contractures Contractures Info: Not present    Additional Factors Info  Allergies, Code Status Code Status Info: DNR Allergies Info: GELATIN, LATEX, NEOMYCIN, ADHESIVE TAPE, INFLUENZA VACCINES, ZETIA EZETIMIBE           Current Medications (10/15/2018):  This is the current hospital active medication list Current Facility-Administered Medications  Medication Dose Route Frequency Provider Last Rate Last Dose  . acetaminophen (TYLENOL) tablet 650 mg  650 mg Oral Q6H PRN Irene Pap N, DO   650 mg at 10/14/18 1532  . albuterol (PROVENTIL) (2.5 MG/3ML) 0.083% nebulizer solution 2.5 mg  2.5 mg Nebulization BID Oswald Hillock, MD   2.5 mg at 10/15/18 0916  . amLODipine (NORVASC) tablet 10 mg  10 mg Oral Daily Irene Pap N, DO   10 mg at 10/14/18 1134  . aspirin chewable tablet 81 mg  81 mg Oral Daily Irene Pap N, DO   81 mg at 10/14/18 1133  . bumetanide (BUMEX) tablet 2 mg  2 mg Oral BID Daune Perch, NP   2 mg at 10/15/18 0835  . calcium acetate (PHOSLO) capsule 667 mg  667  mg Oral TID WC Edrick Oh, MD   667 mg at 10/15/18 0835  . carvedilol (COREG) tablet 12.5 mg  12.5 mg Oral BID WC Skeet Latch, MD   12.5 mg at 10/15/18 0835  . colesevelam [facility]) tablet 625 mg  625 mg Oral BID PRN Irene Pap N, DO      . feeding supplement (ENSURE ENLIVE) (ENSURE ENLIVE) liquid 237 mL  237 mL Oral BID BM Hall, Carole N, DO   237 mL at 10/14/18 1135  . feeding supplement (NEPRO CARB STEADY) liquid 237 mL  237 mL Oral BID BM Hall, Carole N, DO   237 mL at 10/14/18 1135  . ferrous sulfate tablet 325 mg  325 mg Oral Q breakfast Kayleen Memos,  DO   325 mg at 10/15/18 1601  . guaiFENesin (MUCINEX) 12 hr tablet 600 mg  600 mg Oral BID Irene Pap N, DO   600 mg at 10/14/18 2030  . heparin injection 5,000 Units  5,000 Units Subcutaneous Q8H Irene Pap N, DO   5,000 Units at 10/15/18 0932  . hydrALAZINE (APRESOLINE) injection 10 mg  10 mg Intravenous Q6H PRN Irene Pap N, DO      . hydrALAZINE (APRESOLINE) tablet 100 mg  100 mg Oral Q8H Skeet Latch, MD   100 mg at 10/15/18 3557  . isosorbide mononitrate (IMDUR) 24 hr tablet 30 mg  30 mg Oral Daily [place], Archie Patten N, DO   30 mg at 10/14/18 1133  . latanoprost (XALATAN) 0.005 % ophthalmic solution 1 drop  1 drop Both Eyes QHS Hall, Archie Patten N, DO   1 drop at 10/14/18 2031  . levalbuterol (XOPENEX) nebulizer solution 0.63 mg  0.63 mg Nebulization Q6H PRN Irene Pap N, DO      . multivitamin with minerals tablet   Oral Daily Irene Pap N, DO   1 tablet at 10/14/18 1133  . ondansetron (ZOFRAN) injection 4 mg  4 mg Intravenous Q6H PRN Irene Pap N, DO      . potassium chloride SA (K-DUR,KLOR-CON) CR tablet 20 mEq  20 mEq Oral BID Oswald Hillock, MD   20 mEq at 10/14/18 2030  . simvastatin (ZOCOR) tablet 10 mg  10 mg Oral QHS Hall, Carole N, DO   10 mg at 10/14/18 2030  . tiotropium (SPIRIVA) inhalation capsule ([facility] use ONLY) 18 mcg  18 mcg Inhalation Daily Irene Pap N, DO   18 mcg at 10/15/18 0916  . Vitamin D (Ergocalciferol) (DRISDOL) capsule 50,000 Units  50,000 Units Oral Q7 days Kayleen Memos, DO   50,000 Units at 10/14/18 1134     Discharge Medications: Please see discharge summary for a list of discharge medications.  Relevant Imaging Results:  Relevant Lab Results:   Additional Information SSN: [id number]  Wende Neighbors, LCSW

## 2018-10-15 NOTE — Progress Notes (Signed)
Clinical Social Worker facilitated patient discharge including contacting patient family and facility to confirm patient discharge plans.  Clinical information faxed to facility and family agreeable with plan. Patients daughter Marliss Coots will be transporting patient back to [facility] .  RN to call [phone] and ask to speak to RN Juliann Pulse for report prior to discharge.  Clinical Social Worker will sign off for now as social work intervention is no longer needed. Please consult [place] again if new need arises.  Rhea Pink, MSW, [place]

## 2018-10-15 NOTE — Progress Notes (Signed)
Progress Note  Patient Name: Kathryn Peck Date of Encounter: 10/15/2018  Primary Cardiologist: Skeet Latch, MD   Subjective   No CP   Breathing is OK   No nausea  Inpatient Medications    Scheduled Meds: . albuterol  2.5 mg Nebulization BID  . amLODipine  10 mg Oral Daily  . aspirin  81 mg Oral Daily  . bumetanide  2 mg Oral BID  . calcium acetate  667 mg Oral TID WC  . carvedilol  12.5 mg Oral BID WC  . feeding supplement (ENSURE ENLIVE)  237 mL Oral BID BM  . feeding supplement (NEPRO CARB STEADY)  237 mL Oral BID BM  . ferrous sulfate  325 mg Oral Q breakfast  . guaiFENesin  600 mg Oral BID  . heparin injection (subcutaneous)  5,000 Units Subcutaneous Q8H  . hydrALAZINE  100 mg Oral Q8H  . isosorbide mononitrate  30 mg Oral Daily  . latanoprost  1 drop Both Eyes QHS  . multivitamin with minerals   Oral Daily  . potassium chloride  20 mEq Oral BID  . simvastatin  10 mg Oral QHS  . tiotropium  18 mcg Inhalation Daily  . Vitamin D (Ergocalciferol)  50,000 Units Oral Q7 days   Continuous Infusions:  PRN Meds: acetaminophen, colesevelam, hydrALAZINE, levalbuterol, ondansetron (ZOFRAN) IV   Vital Signs    Vitals:   10/14/18 2018 10/14/18 2020 10/15/18 0451 10/15/18 0900  BP: (!) 153/43  (!) 155/48   Pulse: (!) 59  (!) 58   Resp:   16   Temp:   98.6 F (37 C)   TempSrc:   Oral   SpO2:  95% 94% 95%  Weight:   84.4 kg   Height:        Intake/Output Summary (Last 24 hours) at 10/15/2018 0920 Last data filed at 10/14/2018 1339 Gross per 24 hour  Intake -  Output 300 ml  Net -300 ml   Filed Weights   10/13/18 0606 10/14/18 0610 10/15/18 0451  Weight: 83.8 kg 86.4 kg 84.4 kg    Telemetry    SB/SR   2 short bursts of PAT /SVT   Self limited  - Personally Reviewed  ECG    None done   - Personally Reviewed  Physical Exam   GEN: No acute distress.   Neck: No JVD Cardiac: RRR,    III/VI systolic murmur  Base  no rubs, or gallops.    Respiratory: Clear to auscultation bilaterally. GI: Soft, nontender, non-distended  MS: Tr edema; No deformity. Neuro:  Nonfocal  Psych: Normal affect   Labs    Chemistry Recent Labs  Lab 10/11/18 1351  10/13/18 1500 10/14/18 0423 10/15/18 0440  NA 141   < > 140 141 141  K 5.0   < > 3.4* 3.4* 4.2  CL 110   < > 105 107 107  CO2 22   < > 23 23 23   GLUCOSE 99   < > 162* 97 91  BUN 80*   < > 88* 89* 87*  CREATININE 4.53*   < > 4.15* 4.16* 4.02*  CALCIUM 9.2   < > 9.1 9.4 9.5  PROT 7.6  --   --   --   --   ALBUMIN 3.8  --   --   --   --   AST 26  --   --   --   --   ALT 23  --   --   --   --  ALKPHOS 50  --   --   --   --   BILITOT 0.7  --   --   --   --   GFRNONAA 8*   < > 9* 9* 9*  GFRAA 9*   < > 10* 10* 11*  ANIONGAP 9   < > 12 11 11    < > = values in this interval not displayed.     Hematology Recent Labs  Lab 10/11/18 1351 10/12/18 0655  WBC 9.2 7.9  RBC 2.87* 2.82*  HGB 8.1* 7.9*  HCT 27.6* 27.4*  MCV 96.2 97.2  MCH 28.2 28.0  MCHC 29.3* 28.8*  RDW 17.9* 17.5*  PLT 299 267    Cardiac Enzymes Recent Labs  Lab 10/11/18 1351 10/11/18 1709 10/11/18 2238  TROPONINI 0.06* 0.05* 0.05*   No results for input(s): TROPIPOC in the last 168 hours.   BNP Recent Labs  Lab 10/11/18 1351 10/12/18 0448  BNP 2,897.9* 2,455.8*     DDimer No results for input(s): DDIMER in the last 168 hours.   Radiology    No results found.  Cardiac Studies   Echocardiogram 10/05/2018 Study Conclusions - Left ventricle: The cavity size was normal. There was mild concentric hypertrophy. Systolic function was normal. The estimated ejection fraction was in the range of 55% to 60%. Wall motion was normal; there were no regional wall motion abnormalities. Features are consistent with a pseudonormal left ventricular filling pattern, with concomitant abnormal relaxation and increased filling pressure (grade 2 diastolic dysfunction). - Aortic valve: Cusp  separation was moderately reduced. Noncoronary cusp mobility was severely restricted. There was moderate stenosis. There was trivial regurgitation. Valve area (VTI): 1.13 cm^2. Valve area (Vmax): 1.31 cm^2. Valve area (Vmean): 1.21 cm^2. - Mitral valve: Severely calcified annulus. Mildly thickened leaflets . The findings are consistent with mild stenosis. There was moderate regurgitation directed eccentrically and posteriorly. Mean gradient (D): 6 mm Hg. Gradients at 60 bpm. Valve area by continuity equation (using LVOT flow): 1.39 cm^2. - Left atrium: The atrium was severely dilated. - Right ventricle: The cavity size was mildly dilated. - Right atrium: The atrium was moderately dilated. - Tricuspid valve: There was mild-moderate regurgitation directed centrally. - Pulmonary arteries: Systolic pressure was moderately increased. PA peak pressure: 62 mm Hg (S).  Patient Profile     84 y.o. female with a hx of chronic diastolic heart failure, moderate aortic stenosis, CKD stage 5, OSA on CPAP, COPD, hypertension, and HLDwho is being seen today for the evaluation of SOB and hypertension.  Assessment & Plan    Acute on chronic diastolic heart failure -Diuresed with IV Lasix with improvement in breathing and lower extremity edema -Now transitioned to Bumex 2 mg twice daily as per home regimen Pt comfortable  To walk with pulse ox   May need some home O2    Hypertension -Continues on amlodipine 10 mg daily, Imdur 30 mg daily,  hydralazine increased up to 100 mg 3 times daily. Metoprolol was transitioned to carvedilol 12.5 mg twice daily BP is 150s/   This can be followed as an outpt   Want to avoid hypotension given AS    CKD stage V Creatinine continues to improve, 4.02 today  OSA -Continue CPAP at bedtime  Aortic stenosis -Moderate by echo on 10/05/2018, mean gradient 24 mmHg  Mitral valve disease -Mild MS by echo, with moderate  regurgitation  Troponin elevation -Mild and flat pattern consistent with demand ischemia in the setting of anemia, CHF and  hypertension   Pt is OK for discharge from cardiac standpoint.   Will make sure she has f/u.    For questions or updates, please contact [place] Please consult [email or web address] for contact info under        Signed, Daune Perch, NP  10/15/2018, 9:20 AM

## 2018-10-15 NOTE — Progress Notes (Signed)
[facility] R[facility]ING NOTE   Subjective:   Still appears dyspneic had some desaturations on ambulation she continues to use oxygen.  Appreciate cardiology input.  Blood pressure 155/48 pulse 58 temperature 98.6 O2 sats 95% 2  right nasal cannula  Sodium 141 potassium 4.2 chloride 107 CO2 23 BUN 87 creatinine is 4.02 calcium 9.5 hemoglobin 7.9  Output urine output 1000 cc 10/14/2018  Objective:  Vital signs in last 24 hours:  Temp:  [98.6 F (37 C)-98.7 F (37.1 C)] 98.6 F (37 C) (11/01 0451) Pulse Rate:  [58-59] 58 (11/01 0451) Resp:  [16-20] 16 (11/01 0451) BP: (145-155)/(38-48) 155/48 (11/01 0451) SpO2:  [94 %-95 %] 95 % (11/01 0900) Weight:  [84.4 kg] 84.4 kg (11/01 0451)  Weight change: -2 kg Filed Weights   10/13/18 0606 10/14/18 0610 10/15/18 0451  Weight: 83.8 kg 86.4 kg 84.4 kg    Intake/Output: I/O last 3 completed shifts: In: -  Out: 1000 [Urine:1000]   Intake/Output this shift:  No intake/output data recorded.  CVS- RRR JVP elevated RS- CTA decreased breath sounds and rales mostly left using oxygen ABD- BS present soft non-distended EXT- no edema   Basic Metabolic Panel: Recent Labs  Lab 10/11/18 1351 10/12/18 0448 10/13/18 1500 10/14/18 0423 10/15/18 0440  NA 141 139 140 141 141  K 5.0 4.6 3.4* 3.4* 4.2  CL 110 107 105 107 107  CO2 22 20* 23 23 23   GLUCOSE 99 81 162* 97 91  BUN 80* 80* 88* 89* 87*  CREATININE 4.53* 4.31* 4.15* 4.16* 4.02*  CALCIUM 9.2 8.8* 9.1 9.4 9.5  MG  --  2.2  --   --   --   PHOS  --  6.1*  --   --   --     Liver Function Tests: Recent Labs  Lab 10/11/18 1351  AST 26  ALT 23  ALKPHOS 50  BILITOT 0.7  PROT 7.6  ALBUMIN 3.8   No results for input(s): LIPASE, AMYLASE in the last 168 hours. No results for input(s): AMMONIA in the last 168 hours.  CBC: Recent Labs  Lab 10/11/18 1351 10/12/18 0655  WBC 9.2 7.9  NEUTROABS 6.7  --   HGB 8.1* 7.9*  HCT 27.6* 27.4*  MCV 96.2 97.2  PLT 299  267    Cardiac Enzymes: Recent Labs  Lab 10/11/18 1351 10/11/18 1709 10/11/18 2238  TROPONINI 0.06* 0.05* 0.05*    BNP: Invalid input(s): POCBNP  CBG: No results for input(s): GLUCAP in the last 168 hours.  Microbiology: No results found for this or any previous visit.  Coagulation Studies: No results for input(s): LABPROT, INR in the last 72 hours.  Urinalysis: No results for input(s): COLORURINE, LABSPEC, PHURINE, GLUCOSEU, HGBUR, BILIRUBINUR, KETONESUR, PROTEINUR, UROBILINOGEN, NITRITE, LEUKOCYTESUR in the last 72 hours.  Invalid input(s): APPERANCEUR    Imaging: No results found.   Medications:    . albuterol  2.5 mg Nebulization BID  . amLODipine  10 mg Oral Daily  . aspirin  81 mg Oral Daily  . bumetanide  2 mg Oral BID  . calcium acetate  667 mg Oral TID WC  . carvedilol  12.5 mg Oral BID WC  . feeding supplement (ENSURE ENLIVE)  237 mL Oral BID BM  . feeding supplement (NEPRO CARB STEADY)  237 mL Oral BID BM  . ferrous sulfate  325 mg Oral Q breakfast  . guaiFENesin  600 mg Oral BID  . heparin injection (subcutaneous)  5,000 Units Subcutaneous  Q8H  . hydrALAZINE  100 mg Oral Q8H  . isosorbide mononitrate  30 mg Oral Daily  . latanoprost  1 drop Both Eyes QHS  . multivitamin with minerals   Oral Daily  . potassium chloride  20 mEq Oral BID  . simvastatin  10 mg Oral QHS  . tiotropium  18 mcg Inhalation Daily  . Vitamin D (Ergocalciferol)  50,000 Units Oral Q7 days   acetaminophen, colesevelam, hydrALAZINE, levalbuterol, ondansetron (ZOFRAN) IV  Assessment/ Plan:   Chronic renal failure failure stage V follow-up with Dr. Posey Pronto not candidate for dialysis again discussed with daughter in room and have reemphasized that she is not a dialysis candidate etiology of the renal failure appears to be hypertension probably has an element of renal artery stenosis avoid ACE inhibitors avoid ARB's avoid contrast.  Volume she has some diastolic dysfunction she  is diuresing with oral Bumex she will continue on this as an outpatient she can follow-up with Dr. Posey Pronto  Anemia will need follow-up with Dr. Posey Pronto [phone] at [facility].  She has been receiving IV iron.  Follow-up with Dr. Posey Pronto as outpatient  Bones she had hyperphosphatemia and started PhosLo 1 with meals.  Follow-up with Dr. Posey Pronto   LOS: [place] @TODAY @5 :19 PM

## 2018-10-15 NOTE — Care Management Note (Signed)
Case Management Note  Patient Details  Name: Kathryn Peck MRN: [id number] Date of Birth: 03-23-1928  Subjective/Objective:                    Action/Plan:   Expected Discharge Date:  10/15/18               Expected Discharge Plan:  Assisted Living / Rest Home  In-House Referral:     Discharge planning Services  CM Consult  Post Acute Care Choice:  Home Health Choice offered to:  Adult Children  DME Arranged:  Oxygen DME Agency:  [place] Arranged:  RN, PT, OT, Social Work [employer] Agency:  [facility] (now [facility])  Status of Service:  Completed, signed off  If discussed at [employer], dates discussed:    Additional CommentsPurcell Mouton, RN 10/15/2018, 11:55 AM

## 2018-10-15 NOTE — Progress Notes (Signed)
SATURATION QUALIFICATIONS: (This note is used to comply with regulatory documentation for home oxygen)  Patient Saturations on Room Air at Rest = 92%  Patient Saturations on Room Air while Ambulating = 88%  Patient Saturations on 2 Liters of oxygen while Ambulating = 93%  Please briefly explain why patient needs home oxygen: Dyspneic with exertion

## 2018-10-15 NOTE — Progress Notes (Signed)
Pt's daughter will transport pt to [facility].

## 2018-10-15 NOTE — Progress Notes (Signed)
O2 Sats 88% on room air at rest.

## 2018-10-18 ENCOUNTER — Ambulatory Visit (INDEPENDENT_AMBULATORY_CARE_PROVIDER_SITE_OTHER): Payer: Medicare Other | Admitting: Cardiovascular Disease

## 2018-10-18 ENCOUNTER — Encounter: Payer: Self-pay | Admitting: Cardiovascular Disease

## 2018-10-18 VITALS — BP 138/48 | HR 59 | Ht 63.0 in | Wt 188.4 lb

## 2018-10-18 DIAGNOSIS — E7800 Pure hypercholesterolemia, unspecified: Secondary | ICD-10-CM | POA: Diagnosis not present

## 2018-10-18 DIAGNOSIS — I350 Nonrheumatic aortic (valve) stenosis: Secondary | ICD-10-CM

## 2018-10-18 DIAGNOSIS — I5032 Chronic diastolic (congestive) heart failure: Secondary | ICD-10-CM

## 2018-10-18 DIAGNOSIS — I10 Essential (primary) hypertension: Secondary | ICD-10-CM | POA: Diagnosis not present

## 2018-10-18 DIAGNOSIS — I340 Nonrheumatic mitral (valve) insufficiency: Secondary | ICD-10-CM

## 2018-10-18 DIAGNOSIS — I272 Pulmonary hypertension, unspecified: Secondary | ICD-10-CM

## 2018-10-18 NOTE — Patient Instructions (Signed)
Medication Instructions:  Your physician recommends that you continue on your current medications as directed. Please refer to the Current Medication list given to you today.  If you need a refill on your cardiac medications before your next appointment, please call your pharmacy.   Lab work: NONE  Testing/Procedures: NONE   Follow-Up: At [facility], you and your health needs are our priority.  As part of our continuing mission to provide you with exceptional heart care, we have created designated Provider Care Teams.  These Care Teams include your primary Cardiologist (physician) and Advanced Practice Providers (APPs -  Physician Assistants and Nurse Practitioners) who all work together to provide you with the care you need, when you need it. You will need a follow up appointment in 2 months.  You may see Skeet Latch, MD or one of the following Advanced Practice Providers on your designated Care Team:   Kerin Ransom, PA-C Roby Lofts, [place] . Sande Rives, PA-C

## 2018-10-18 NOTE — Progress Notes (Signed)
Cardiology Office Note   Date:  10/18/2018   ID:  Kathryn Peck, DOB 1928-05-14, MRN [id number]  PCP:  [name], FNP  Cardiologist:   Skeet Latch, MD   No chief complaint on file.     History of Present Illness: Kathryn Peck is a 84 y.o. female with chronic diastolic heart failure, moderate aortic stenosis, CKD 5, severe pulmonary hypertension, OSA on CPAP, COPD, hypertension and hyperlipidemia here for follow up.  Kathryn Peck was admitted 10/21 through 10/25 2019 with acute on chronic diastolic heart failure.  She was diuresed with IV Lasix and able to wean off supplemental oxygen.  Echo that admission revealed LVEF 55 to 60% with grade 2 diastolic dysfunction and moderate aortic stenosis (mean gradient 24 mmHg).  She also had moderate mitral stenosis with a mean gradient of 6 mmHg and moderate mitral regurgitation.  PASP was 62 mmHg.  Prior to being discharged she walked from her bed to the door but did not do much other walking or rehab.  She returned to her assisted living facility and initially felt well.  However she quickly started to become more volume overloaded.  She presented again 10/28 with acute on chronic diastolic heart failure.  She was again diuresed and this time was discharged on supplemental oxygen.  Since being discharged Kathryn Peck has been feeling well.  She thinks that her edema has been stable.  She is breathing better today than she has in a while.  She denies chest pain or shortness of breath.  She has no orthopnea or PND.  She is participating in rehab.  She continues to require supplemental oxygen at rest and with exertion.                                                                                                                                                                                  Past Medical History:  Diagnosis Date  . Acute diastolic CHF (congestive heart failure) ([place])   . Acute exacerbation of CHF (congestive  heart failure) ([place]) 10/11/2018  . Acute systolic CHF (congestive heart failure) ([place])   . Aortic valve stenosis 10/13/2018  . CHF (congestive heart failure) ([place])   . CHF exacerbation ([place]) 10/04/2018  . CKD (chronic kidney disease) stage 5, GFR less than 15 ml/min (H[facility]) 10/05/2018  . COPD with acute exacerbation ([place]) 09/24/2018  . COPD with chronic bronchitis ([place]) 10/04/2018  . Dyspnea 10/13/2018  . Essential hypertension 10/04/2018  . Heart murmur   . Hyperlipidemia 10/13/2018  . Hypervolemia   . Obesity (BMI 30-39.9) 10/04/2018  . OSA (obstructive sleep apnea) 09/24/2018  . Type 2 diabetes mellitus ([place]) 10/13/2018  Past Surgical History:  Procedure Laterality Date  . CATARACT EXTRACTION    . EYE SURGERY    . TONSILLECTOMY       Current Outpatient Medications  Medication Sig Dispense Refill  . acetaminophen (TYLENOL) 325 MG tablet Take 650 mg by mouth 2 (two) times daily.     Marland Kitchen amLODipine (NORVASC) 10 MG tablet Take 10 mg by mouth every morning.     Marland Kitchen aspirin 81 MG chewable tablet Chew 81 mg by mouth every morning.     . bumetanide (BUMEX) 2 MG tablet Take 1 tablet (2 mg total) by mouth 2 (two) times daily. 60 tablet 0  . calcium acetate (PHOSLO) 667 MG capsule Take 1 capsule (667 mg total) by mouth 3 (three) times daily with meals. 90 capsule 2  . carvedilol (COREG) 12.5 MG tablet Take 1 tablet (12.5 mg total) by mouth 2 (two) times daily with a meal. 60 tablet 2  . colesevelam (WELCHOL) 625 MG tablet Take 625 mg by mouth 2 (two) times daily as needed (Cholesterol).     . ferrous sulfate 325 (65 FE) MG tablet Take 1 tablet (325 mg total) by mouth daily with breakfast. 30 tablet 3  . hydrALAZINE (APRESOLINE) 25 MG tablet Take 25 mg by mouth 3 (three) times daily.    . isosorbide mononitrate (IMDUR) 30 MG 24 hr tablet Take 30 mg by mouth daily.    Marland Kitchen latanoprost (XALATAN) 0.005 % ophthalmic solution Place 1 drop into both eyes at bedtime.     . levalbuterol (XOPENEX) 0.63  MG/3ML nebulizer solution Take 3 mLs (0.63 mg total) by nebulization every 6 (six) hours as needed for wheezing or shortness of breath. 3 mL 0  . loperamide (IMODIUM A-D) 2 MG tablet Take 2 mg by mouth 2 (two) times daily as needed for diarrhea or loose stools.    . Misc Natural Products (OSTEO BI-FLEX JOINT SHIELD PO) Take 1 tablet by mouth 2 (two) times daily.    . Multiple Vitamins-Minerals (CENTRUM SILVER ULTRA WOMENS PO) Take 1 tablet by mouth every morning.     . potassium chloride SA (K-DUR,KLOR-CON) 20 MEQ tablet Take 1 tablet (20 mEq total) by mouth 2 (two) times daily.    . Probiotic Product (PROBIOTIC COLON SUPPORT PO) Take 1 capsule by mouth every morning.     . simvastatin (ZOCOR) 10 MG tablet Take 10 mg by mouth at bedtime.     . Tiotropium Bromide Monohydrate (SPIRIVA RESPIMAT) 2.5 MCG/ACT AERS Inhale 2 puffs into the lungs daily. 1 Inhaler 3  . Vitamin D, Ergocalciferol, (DRISDOL) 50000 units CAPS capsule Take 50,000 Units by mouth every 7 (seven) days. On Thursdays     No current facility-administered medications for this visit.     Allergies:   Gelatin; Latex; Neomycin; Adhesive [tape]; Influenza vaccines; and Zetia [ezetimibe]    Social History:  The patient  reports that she has never smoked. She has never used smokeless tobacco. She reports that she does not drink alcohol or use drugs.   Family History:  The patient's family history includes Heart disease in her father.    ROS:  Please see the history of present illness.   Otherwise, review of systems are positive for none.   All other systems are reviewed and negative.    PHYSICAL EXAM: VS:  BP (!) 138/48   Pulse (!) 59   Ht 5\' 3"  (1.6 m)   Wt 188 lb 6.4 oz (85.5 kg)   BMI 33.37 kg/m  ,  BMI Body mass index is 33.37 kg/m. GENERAL:  Well appearing HEENT:  Pupils equal round and reactive, fundi not visualized, oral mucosa unremarkable NECK:  + jugular venous distention, waveform within normal limits, carotid  upstroke brisk and symmetric, no bruits, no thyromegaly LYMPHATICS:  No cervical adenopathy LUNGS:  Clear to auscultation bilaterally HEART:  RRR.  PMI not displaced or sustained,S1 and S2 within normal limits, no S3, no S4, no clicks, no rubs, III/VI systolic murmur at the LUSB  ABD:  Flat, positive bowel sounds normal in frequency in pitch, no bruits, no rebound, no guarding, no midline pulsatile mass, no hepatomegaly, no splenomegaly EXT:  2 plus pulses throughout, 1+ LE edema, no cyanosis no clubbing SKIN:  No rashes no nodules NEURO:  Cranial nerves II through XII grossly intact, motor grossly intact throughout PSYCH:  Cognitively intact, oriented to person place and time   EKG:  EKG is not ordered today.  Echocardiogram 10/05/2018 Study Conclusions - Left ventricle: The cavity size was normal. There was mild concentric hypertrophy. Systolic function was normal. The estimated ejection fraction was in the range of 55% to 60%. Wall motion was normal; there were no regional wall motion abnormalities. Features are consistent with a pseudonormal left ventricular filling pattern, with concomitant abnormal relaxation and increased filling pressure (grade 2 diastolic dysfunction). - Aortic valve: Cusp separation was moderately reduced. Noncoronary cusp mobility was severely restricted. There was moderate stenosis. There was trivial regurgitation. Valve area (VTI): 1.13 cm^2. Valve area (Vmax): 1.31 cm^2. Valve area (Vmean): 1.21 cm^2. - Mitral valve: Severely calcified annulus. Mildly thickened leaflets . The findings are consistent with mild stenosis. There was moderate regurgitation directed eccentrically and posteriorly. Mean gradient (D): 6 mm Hg. Gradients at 60 bpm. Valve area by continuity equation (using LVOT flow): 1.39 cm^2. - Left atrium: The atrium was severely dilated. - Right ventricle: The cavity size was mildly dilated. - Right atrium: The  atrium was moderately dilated. - Tricuspid valve: There was mild-moderate regurgitation directed centrally. - Pulmonary arteries: Systolic pressure was moderately increased. PA peak pressure: 62 mm Hg (S).   Recent Labs: 10/11/2018: ALT 23 10/12/2018: B Natriuretic Peptide 2,455.8; Hemoglobin 7.9; Magnesium 2.2; Platelets 267 10/15/2018: BUN 87; Creatinine, Ser 4.02; Potassium 4.2; Sodium 141    Lipid Panel No results found for: CHOL, TRIG, HDL, CHOLHDL, VLDL, LDLCALC, LDLDIRECT    Wt Readings from Last 3 Encounters:  10/18/18 188 lb 6.4 oz (85.5 kg)  10/15/18 186 lb 1.1 oz (84.4 kg)  10/08/18 186 lb 11.7 oz (84.7 kg)      ASSESSMENT AND PLAN:  # Chronic diastolic heart failure: # Severe pulmonary hypertension: # Hypertension: Ms. Devincentis remains mildly volume overloaded but is feeling much better.  Her pulmonary hypertension is 2/2 L sided heart disease (valvular and diastolic dysfunction).  Continue bumex, amlodipine, carvedilol, hydralazine, and isosorbide mononitrate).  # Moderate aortic stenosis: # Moderate mitral stenosis: # Moderate mitral regurgitation: Continue to monitor and manage volume as above.  # CKD IV:  Renal function remained stable with diuresis.  She has refused HD.  # Hypertension: BP reasonably controlled.  Avoid hypotension given age and aortic stenosis.  # Hyperlipidemia:  Continue simvastatin.   Current medicines are reviewed at length with the patient today.  The patient does not have concerns regarding medicines.  The following changes have been made:  no change  Labs/ tests ordered today include:  No orders of the defined types were placed in this encounter.    Disposition:  FU with Carmin Alvidrez C. Oval Linsey, MD, [facility] in 2 months.      Signed, Chela Sutphen C. Oval Linsey, MD, [facility]  10/18/2018 11:01 AM    [place]

## 2018-10-19 ENCOUNTER — Encounter: Payer: Self-pay | Admitting: Cardiovascular Disease

## 2018-10-24 ENCOUNTER — Other Ambulatory Visit: Payer: Self-pay

## 2018-10-24 ENCOUNTER — Emergency Department (HOSPITAL_COMMUNITY): Payer: Medicare Other

## 2018-10-24 ENCOUNTER — Encounter (HOSPITAL_COMMUNITY): Payer: Self-pay | Admitting: Emergency Medicine

## 2018-10-24 ENCOUNTER — Inpatient Hospital Stay (HOSPITAL_COMMUNITY)
Admission: EM | Admit: 2018-10-24 | Discharge: 2018-11-14 | DRG: 291 | Disposition: E | Payer: Medicare Other | Attending: Internal Medicine | Admitting: Internal Medicine

## 2018-10-24 DIAGNOSIS — E1122 Type 2 diabetes mellitus with diabetic chronic kidney disease: Secondary | ICD-10-CM | POA: Diagnosis present

## 2018-10-24 DIAGNOSIS — R918 Other nonspecific abnormal finding of lung field: Secondary | ICD-10-CM | POA: Diagnosis not present

## 2018-10-24 DIAGNOSIS — E669 Obesity, unspecified: Secondary | ICD-10-CM | POA: Diagnosis present

## 2018-10-24 DIAGNOSIS — Z515 Encounter for palliative care: Secondary | ICD-10-CM | POA: Diagnosis present

## 2018-10-24 DIAGNOSIS — Z6837 Body mass index (BMI) 37.0-37.9, adult: Secondary | ICD-10-CM

## 2018-10-24 DIAGNOSIS — Z91040 Latex allergy status: Secondary | ICD-10-CM

## 2018-10-24 DIAGNOSIS — I5033 Acute on chronic diastolic (congestive) heart failure: Secondary | ICD-10-CM | POA: Diagnosis present

## 2018-10-24 DIAGNOSIS — R531 Weakness: Secondary | ICD-10-CM | POA: Diagnosis not present

## 2018-10-24 DIAGNOSIS — Z887 Allergy status to serum and vaccine status: Secondary | ICD-10-CM

## 2018-10-24 DIAGNOSIS — Z7982 Long term (current) use of aspirin: Secondary | ICD-10-CM | POA: Diagnosis not present

## 2018-10-24 DIAGNOSIS — I132 Hypertensive heart and chronic kidney disease with heart failure and with stage 5 chronic kidney disease, or end stage renal disease: Secondary | ICD-10-CM | POA: Diagnosis not present

## 2018-10-24 DIAGNOSIS — Z66 Do not resuscitate: Secondary | ICD-10-CM | POA: Diagnosis present

## 2018-10-24 DIAGNOSIS — Z9849 Cataract extraction status, unspecified eye: Secondary | ICD-10-CM | POA: Diagnosis not present

## 2018-10-24 DIAGNOSIS — Z79899 Other long term (current) drug therapy: Secondary | ICD-10-CM

## 2018-10-24 DIAGNOSIS — I350 Nonrheumatic aortic (valve) stenosis: Secondary | ICD-10-CM | POA: Diagnosis present

## 2018-10-24 DIAGNOSIS — Z91018 Allergy to other foods: Secondary | ICD-10-CM | POA: Diagnosis not present

## 2018-10-24 DIAGNOSIS — E785 Hyperlipidemia, unspecified: Secondary | ICD-10-CM | POA: Diagnosis present

## 2018-10-24 DIAGNOSIS — I120 Hypertensive chronic kidney disease with stage 5 chronic kidney disease or end stage renal disease: Secondary | ICD-10-CM | POA: Diagnosis not present

## 2018-10-24 DIAGNOSIS — N185 Chronic kidney disease, stage 5: Secondary | ICD-10-CM | POA: Diagnosis present

## 2018-10-24 DIAGNOSIS — E875 Hyperkalemia: Secondary | ICD-10-CM

## 2018-10-24 DIAGNOSIS — J4489 Other specified chronic obstructive pulmonary disease: Secondary | ICD-10-CM | POA: Diagnosis present

## 2018-10-24 DIAGNOSIS — R011 Cardiac murmur, unspecified: Secondary | ICD-10-CM | POA: Diagnosis present

## 2018-10-24 DIAGNOSIS — Z888 Allergy status to other drugs, medicaments and biological substances status: Secondary | ICD-10-CM

## 2018-10-24 DIAGNOSIS — G4733 Obstructive sleep apnea (adult) (pediatric): Secondary | ICD-10-CM | POA: Diagnosis present

## 2018-10-24 DIAGNOSIS — R42 Dizziness and giddiness: Secondary | ICD-10-CM | POA: Diagnosis not present

## 2018-10-24 DIAGNOSIS — J449 Chronic obstructive pulmonary disease, unspecified: Secondary | ICD-10-CM | POA: Diagnosis present

## 2018-10-24 DIAGNOSIS — I272 Pulmonary hypertension, unspecified: Secondary | ICD-10-CM | POA: Diagnosis present

## 2018-10-24 DIAGNOSIS — Z91048 Other nonmedicinal substance allergy status: Secondary | ICD-10-CM

## 2018-10-24 DIAGNOSIS — R001 Bradycardia, unspecified: Secondary | ICD-10-CM

## 2018-10-24 DIAGNOSIS — N179 Acute kidney failure, unspecified: Secondary | ICD-10-CM | POA: Diagnosis present

## 2018-10-24 DIAGNOSIS — N189 Chronic kidney disease, unspecified: Secondary | ICD-10-CM | POA: Diagnosis not present

## 2018-10-24 DIAGNOSIS — I509 Heart failure, unspecified: Secondary | ICD-10-CM

## 2018-10-24 DIAGNOSIS — Z8249 Family history of ischemic heart disease and other diseases of the circulatory system: Secondary | ICD-10-CM | POA: Diagnosis not present

## 2018-10-24 LAB — BASIC METABOLIC PANEL
ANION GAP: 11 (ref 5–15)
BUN: 118 mg/dL — ABNORMAL HIGH (ref 8–23)
CALCIUM: 10 mg/dL (ref 8.9–10.3)
CO2: 16 mmol/L — AB (ref 22–32)
CREATININE: 6.52 mg/dL — AB (ref 0.44–1.00)
Chloride: 109 mmol/L (ref 98–111)
GFR, EST AFRICAN AMERICAN: 6 mL/min — AB (ref >60)
GFR, EST NON AFRICAN AMERICAN: 5 mL/min — AB (ref >60)
GLUCOSE: 126 mg/dL — AB (ref 70–99)
Potassium: >7.5 mmol/L (ref 3.5–5.1)
Sodium: 136 mmol/L (ref 135–145)

## 2018-10-24 LAB — CBC WITH DIFFERENTIAL/PLATELET
ABS IMMATURE GRANULOCYTES: 0.14 10*3/uL — AB (ref 0.00–0.07)
Basophils Absolute: 0 10*3/uL (ref 0.0–0.1)
Basophils Relative: 0 %
EOS PCT: 0 %
Eosinophils Absolute: 0 10*3/uL (ref 0.0–0.5)
HCT: 29.5 % — ABNORMAL LOW (ref 36.0–46.0)
HEMOGLOBIN: 8.4 g/dL — AB (ref 12.0–15.0)
Immature Granulocytes: 1 %
LYMPHS PCT: 8 %
Lymphs Abs: 0.8 10*3/uL (ref 0.7–4.0)
MCH: 28.5 pg (ref 26.0–34.0)
MCHC: 28.5 g/dL — AB (ref 30.0–36.0)
MCV: 100 fL (ref 80.0–100.0)
MONO ABS: 0.7 10*3/uL (ref 0.1–1.0)
MONOS PCT: 7 %
NEUTROS ABS: 8.7 10*3/uL — AB (ref 1.7–7.7)
Neutrophils Relative %: 84 %
Platelets: 266 10*3/uL (ref 150–400)
RBC: 2.95 MIL/uL — ABNORMAL LOW (ref 3.87–5.11)
RDW: 19.3 % — ABNORMAL HIGH (ref 11.5–15.5)
WBC: 10.4 10*3/uL (ref 4.0–10.5)
nRBC: 0.3 % — ABNORMAL HIGH (ref 0.0–0.2)

## 2018-10-24 LAB — BRAIN NATRIURETIC PEPTIDE: B Natriuretic Peptide: 2454.4 pg/mL — ABNORMAL HIGH (ref 0.0–100.0)

## 2018-10-24 LAB — I-STAT TROPONIN, ED: Troponin i, poc: 0.03 ng/mL (ref 0.00–0.08)

## 2018-10-24 LAB — MAGNESIUM: Magnesium: 2.9 mg/dL — ABNORMAL HIGH (ref 1.7–2.4)

## 2018-10-24 LAB — CBG MONITORING, ED: GLUCOSE-CAPILLARY: 127 mg/dL — AB (ref 70–99)

## 2018-10-24 MED ORDER — SODIUM CHLORIDE 0.9 % IV SOLN
1.0000 g | Freq: Once | INTRAVENOUS | Status: AC
  Administered 2018-10-24: 1 g via INTRAVENOUS
  Filled 2018-10-24: qty 10

## 2018-10-24 MED ORDER — MORPHINE SULFATE (PF) 2 MG/ML IV SOLN
2.0000 mg | INTRAVENOUS | Status: DC | PRN
  Administered 2018-10-25 (×3): 2 mg via INTRAVENOUS
  Filled 2018-10-24 (×4): qty 1

## 2018-10-24 MED ORDER — SODIUM BICARBONATE 8.4 % IV SOLN
50.0000 meq | Freq: Once | INTRAVENOUS | Status: AC
  Administered 2018-10-24: 50 meq via INTRAVENOUS
  Filled 2018-10-24: qty 50

## 2018-10-24 MED ORDER — ONDANSETRON HCL 4 MG/2ML IJ SOLN: 4.0000 mg | Freq: Four times a day (QID) | INTRAMUSCULAR | Status: DC | PRN

## 2018-10-24 MED ORDER — GLYCOPYRROLATE 0.2 MG/ML IJ SOLN
0.1000 mg | Freq: Two times a day (BID) | INTRAMUSCULAR | Status: DC
  Administered 2018-10-24 – 2018-10-25 (×2): 0.1 mg via INTRAVENOUS
  Filled 2018-10-24 (×3): qty 0.5

## 2018-10-24 MED ORDER — SODIUM CHLORIDE 0.9 % IV SOLN: Freq: Once | INTRAVENOUS | Status: DC

## 2018-10-24 MED ORDER — INSULIN ASPART 100 UNIT/ML IV SOLN
5.0000 [IU] | Freq: Once | INTRAVENOUS | Status: AC
  Administered 2018-10-24: 5 [IU] via INTRAVENOUS
  Filled 2018-10-24: qty 0.05

## 2018-10-24 MED ORDER — DEXTROSE 50 % IV SOLN
1.0000 | Freq: Once | INTRAVENOUS | Status: AC
  Administered 2018-10-24: 50 mL via INTRAVENOUS
  Filled 2018-10-24: qty 50

## 2018-10-24 NOTE — ED Notes (Signed)
Date and time results received: 11/12/2018 1:49 PM   Test: Potassium Critical Value: >7.5  Name of Provider Notified: Melina Copa

## 2018-10-24 NOTE — ED Provider Notes (Signed)
[facility] DEPT Provider Note   CSN: [id number] Arrival date & time: 10/30/2018  1151     History   Chief Complaint Chief Complaint  Patient presents with  . Weakness  . Bradycardia    HPI Kathryn Peck is a 84 y.o. female.  She is a history of CHF COPD aortic stenosis and was recently hospitalized for increased shortness of breath.  She was discharged last week back to her facility on oxygen.  Her daughter is giving most of the history secondary to the patient's lethargy.  Level 5 caveat.  Reportedly since last evening she has been weaker than normal and today they noted her heart rate was lower than usual.  Usually she has been able to ambulate with a walker but now she can only take a step before she is too fatigued and short of breath.  No reported fever.  Patient is a limited historian.  The history is provided by a relative.  Weakness  Primary symptoms include no focal weakness. This is a new problem. The current episode started yesterday. The problem has not changed since onset.There was no focality noted. There has been no fever. Associated symptoms include shortness of breath. Pertinent negatives include no chest pain, no vomiting and no headaches. There were no medications administered prior to arrival.    Past Medical History:  Diagnosis Date  . Acute diastolic CHF (congestive heart failure) ([place])   . Acute exacerbation of CHF (congestive heart failure) ([place]) 10/11/2018  . Acute systolic CHF (congestive heart failure) ([place])   . Aortic valve stenosis 10/13/2018  . CHF (congestive heart failure) ([place])   . CHF exacerbation ([place]) 10/04/2018  . CKD (chronic kidney disease) stage 5, GFR less than 15 ml/min (HCC) 10/05/2018  . COPD with acute exacerbation ([place]) 09/24/2018  . COPD with chronic bronchitis ([place]) 10/04/2018  . Dyspnea 10/13/2018  . Essential hypertension 10/04/2018  . Heart murmur   . Hyperlipidemia 10/13/2018  . Hypervolemia     . Obesity (BMI 30-39.9) 10/04/2018  . OSA (obstructive sleep apnea) 09/24/2018  . Type 2 diabetes mellitus ([place]) 10/13/2018    Patient Active Problem List   Diagnosis Date Noted  . Dyspnea 10/13/2018  . Aortic valve stenosis 10/13/2018  . Hyperlipidemia 10/13/2018  . Type 2 diabetes mellitus ([place]) 10/13/2018  . Acute exacerbation of CHF (congestive heart failure) ([place]) 10/11/2018  . CKD (chronic kidney disease) stage 5, GFR less than 15 ml/min (HCC) 10/05/2018  . Hypervolemia   . Acute systolic CHF (congestive heart failure) ([place])   . Acute diastolic CHF (congestive heart failure) ([place])   . CHF exacerbation ([place]) 10/04/2018  . Essential hypertension 10/04/2018  . COPD with chronic bronchitis ([place]) 10/04/2018  . Obesity (BMI 30-39.9) 10/04/2018  . OSA (obstructive sleep apnea) 09/24/2018  . COPD with acute exacerbation ([place]) 09/24/2018    Past Surgical History:  Procedure Laterality Date  . CATARACT EXTRACTION    . EYE SURGERY    . TONSILLECTOMY       OB History   None      Home Medications    Prior to Admission medications   Medication Sig Start Date End Date Taking? Authorizing Provider  acetaminophen (TYLENOL) 325 MG tablet Take 650 mg by mouth 2 (two) times daily.     [provider]  amLODipine (NORVASC) 10 MG tablet Take 10 mg by mouth every morning.     [provider]  aspirin 81 MG chewable tablet Chew 81 mg by  mouth every morning.     [provider]  bumetanide (BUMEX) 2 MG tablet Take 1 tablet (2 mg total) by mouth 2 (two) times daily. 10/08/18   Raiford Noble Latif, DO  calcium acetate (PHOSLO) 667 MG capsule Take 1 capsule (667 mg total) by mouth 3 (three) times daily with meals. 10/15/18   Oswald Hillock, MD  carvedilol (COREG) 12.5 MG tablet Take 1 tablet (12.5 mg total) by mouth 2 (two) times daily with a meal. 10/15/18   Oswald Hillock, MD  colesevelam (WELCHOL) 625 MG tablet Take 625 mg by mouth 2 (two) times daily as needed  (Cholesterol).  09/22/18   [provider]  ferrous sulfate 325 (65 FE) MG tablet Take 1 tablet (325 mg total) by mouth daily with breakfast. 10/16/18   Oswald Hillock, MD  hydrALAZINE (APRESOLINE) 25 MG tablet Take 25 mg by mouth 3 (three) times daily.    [provider]  isosorbide mononitrate (IMDUR) 30 MG 24 hr tablet Take 30 mg by mouth daily.    [provider]  latanoprost (XALATAN) 0.005 % ophthalmic solution Place 1 drop into both eyes at bedtime.     [provider]  levalbuterol Penne Lash) 0.63 MG/3ML nebulizer solution Take 3 mLs (0.63 mg total) by nebulization every 6 (six) hours as needed for wheezing or shortness of breath. 10/08/18   Raiford Noble Latif, DO  loperamide (IMODIUM A-D) 2 MG tablet Take 2 mg by mouth 2 (two) times daily as needed for diarrhea or loose stools.    [provider]  Misc Natural Products (OSTEO BI-FLEX JOINT SHIELD PO) Take 1 tablet by mouth 2 (two) times daily.    [provider]  Multiple Vitamins-Minerals (CENTRUM SILVER ULTRA WOMENS PO) Take 1 tablet by mouth every morning.     [provider]  potassium chloride SA (K-DUR,KLOR-CON) 20 MEQ tablet Take 1 tablet (20 mEq total) by mouth 2 (two) times daily. 10/08/18   Raiford Noble Latif, DO  Probiotic Product (PROBIOTIC COLON SUPPORT PO) Take 1 capsule by mouth every morning.     [provider]  simvastatin (ZOCOR) 10 MG tablet Take 10 mg by mouth at bedtime.     [provider]  Tiotropium Bromide Monohydrate (SPIRIVA RESPIMAT) 2.5 MCG/ACT AERS Inhale 2 puffs into the lungs daily. 09/23/18 10/23/18  Fenton Foy, NP  Vitamin D, Ergocalciferol, (DRISDOL) 50000 units CAPS capsule Take 50,000 Units by mouth every 7 (seven) days. On Thursdays    [provider]    Family History Family History  Problem Relation Age of Onset  . Heart disease Father     Social History Social History   Tobacco Use  . Smoking  status: Never Smoker  . Smokeless tobacco: Never Used  Substance Use Topics  . Alcohol use: Never    Alcohol/week: 0.0 standard drinks    Frequency: Never  . Drug use: Never     Allergies   Gelatin; Latex; Neomycin; Adhesive [tape]; Influenza vaccines; and Zetia [ezetimibe]   Review of Systems Review of Systems  Unable to perform ROS: Mental status change  Respiratory: Positive for shortness of breath.   Cardiovascular: Negative for chest pain.  Gastrointestinal: Negative for vomiting.  Neurological: Positive for weakness. Negative for focal weakness and headaches.     Physical Exam Updated Vital Signs BP (!) 140/50 (BP Location: Left Arm)   Pulse (!) 46   Temp 97.6 F (36.4 C) (Oral)   Ht 5' (1.524 m)  Wt 86.2 kg   SpO2 96%   BMI 37.11 kg/m   Physical Exam  Constitutional: She appears well-developed and well-nourished. No distress.  HENT:  Head: Normocephalic and atraumatic.  Eyes: Conjunctivae are normal.  Neck: Neck supple.  Cardiovascular: Regular rhythm. Bradycardia present.  Murmur heard.  Systolic murmur is present with a grade of 5/6. Pulmonary/Chest: Effort normal and breath sounds normal. No respiratory distress.  Abdominal: Soft. There is no tenderness.  Musculoskeletal: She exhibits edema (3+ bilateral).  Neurological: GCS eye subscore is 3. GCS verbal subscore is 5. GCS motor subscore is 6.  Skin: Skin is warm and dry.  Psychiatric: She has a normal mood and affect.  Nursing note and vitals reviewed.    ED Treatments / Results  Labs (all labs ordered are listed, but only abnormal results are displayed) Labs Reviewed  BASIC METABOLIC PANEL - Abnormal; Notable for the following components:      Result Value   Potassium >7.5 (*)    CO2 16 (*)    Glucose, Bld 126 (*)    BUN 118 (*)    Creatinine, Ser 6.52 (*)    GFR calc non Af Amer 5 (*)    GFR calc Af Amer 6 (*)    All other components within normal limits  CBC WITH  DIFFERENTIAL/PLATELET - Abnormal; Notable for the following components:   RBC 2.95 (*)    Hemoglobin 8.4 (*)    HCT 29.5 (*)    MCHC 28.5 (*)    RDW 19.3 (*)    nRBC 0.3 (*)    Neutro Abs 8.7 (*)    Abs Immature Granulocytes 0.14 (*)    All other components within normal limits  BRAIN NATRIURETIC PEPTIDE - Abnormal; Notable for the following components:   B Natriuretic Peptide 2,454.4 (*)    All other components within normal limits  MAGNESIUM - Abnormal; Notable for the following components:   Magnesium 2.9 (*)    All other components within normal limits  CBG MONITORING, ED - Abnormal; Notable for the following components:   Glucose-Capillary 127 (*)    All other components within normal limits  I-STAT TROPONIN, ED    EKG EKG Interpretation  Date/Time:  Sunday October 24 2018 12:17:24 EST Ventricular Rate:  33 PR Interval:    QRS Duration: 113 QT Interval:  527 QTC Calculation: 391 R Axis:   105 Text Interpretation:  questionable a fib  vs sinus rhythm and artifact  Borderline intraventricular conduction delay Nonspecific T abnormalities, lateral leads Since last tracing rate slower Confirmed by Dorie Rank [phone]) on 10/26/2018 12:20:12 PM   Radiology Dg Chest Port 1 View  Result Date: 11/04/2018 CLINICAL DATA:  Weakness EXAM: PORTABLE CHEST 1 VIEW COMPARISON:  Chest radiograph 10/11/2018 FINDINGS: Monitoring leads overlie the patient. Stable cardiomegaly. Aortic atherosclerosis. Left-greater-than-right heterogeneous opacities mid and lower lungs. No definite pleural effusion. IMPRESSION: Cardiomegaly. Left mid and lower lung heterogeneous opacities may represent atelectasis or infection. Electronically Signed   By: Lovey Newcomer M.D.   On: 11/12/2018 13:26    Procedures .Critical Care Performed by: Hayden Rasmussen, MD Authorized by: Hayden Rasmussen, MD   Critical care provider statement:    Critical care time (minutes):  45   Critical care time was exclusive of:   Separately billable procedures and treating other patients   Critical care was necessary to treat or prevent imminent or life-threatening deterioration of the following conditions:  Metabolic crisis and renal failure   Critical care  was time spent personally by me on the following activities:  Discussions with consultants, evaluation of patient's response to treatment, examination of patient, ordering and performing treatments and interventions, ordering and review of laboratory studies, ordering and review of radiographic studies, pulse oximetry, re-evaluation of patient's condition, obtaining history from patient or surrogate, review of old charts and development of treatment plan with patient or surrogate   I assumed direction of critical care for this patient from another provider in my specialty: no     (including critical care time)  Medications Ordered in ED Medications  morphine 2 MG/ML injection 2 mg (has no administration in time range)  glycopyrrolate (ROBINUL) injection 0.1 mg (has no administration in time range)  ondansetron (ZOFRAN) injection 4 mg (has no administration in time range)  calcium chloride 1 g in sodium chloride 0.9 % 100 mL IVPB ( Intravenous Stopped 11/11/2018 1452)  sodium bicarbonate injection 50 mEq (50 mEq Intravenous Given 11/07/2018 1426)  insulin aspart (novoLOG) injection 5 Units (5 Units Intravenous Given 11/09/2018 1422)    And  dextrose 50 % solution 50 mL (50 mLs Intravenous Given 10/18/2018 1423)     Initial Impression / Assessment and Plan / ED Course  I have reviewed the triage vital signs and the nursing notes.  Pertinent labs & imaging results that were available during my care of the patient were reviewed by me and considered in my medical decision making (see chart for details).  Clinical Course as of Oct 25 1735  Sun Oct 24, 2018  1259 I reviewed with the daughter who is the healthcare proxy the the goals of care.  She would like her to be DNR/DNI  and already has that signed.  I discussed when she consider a pacemaker or transcutaneous pacing and she would not want that for her.  She is expecting that we try to keep her comfortable and correct any correctable causes.   [MB]  1350 Received a call of the patient's potassium is 7.5 critical high.  I have ordered medications per hyperkalemia profile.   [MB]  5188 I updated the daughter and patient regarding her labs.  Her creatinine is worsened to 6 5 from baseline around 4.  She is also markedly hyperkalemic at 7.5.  The daughter is agreeable to medical management of hyperkalemia but she is not willing to have her undergo dialysis.  They understand that this condition could potentially be lethal.  She also again reiterated that she wants patient to be kept comfortable.  I feel the patient would need to be admitted until she has been stabilized.  I have paged the hospitalist.   [MB]  1448 I reviewed the patient's findings with the hospitalist Dr. Tawanna Solo who will accept him for admission.   [MB]    Clinical Course User Index [MB] Hayden Rasmussen, MD     Final Clinical Impressions(s) / ED Diagnoses   Final diagnoses:  Hyperkalemia  Symptomatic bradycardia  Generalized weakness  Acute renal failure superimposed on chronic kidney disease, unspecified CKD stage, unspecified acute renal failure type [facility])    ED Discharge Orders    None       Hayden Rasmussen, MD 10/16/2018 1737

## 2018-10-24 NOTE — ED Triage Notes (Signed)
Per EMS, pt from [facility] senior living is here for weakness. Pt reports waking up feeling weaker than usual. Pt's heart rate was 34 bpm en route to hospital. Pt is A & O x 4. Pt has DNR form.   BP 160/90 HR 34 CBG 156

## 2018-10-24 NOTE — ED Notes (Signed)
ED TO INPATIENT HANDOFF REPORT  Name/Age/Gender Kathryn Peck 83 y.o. female  Code Status    Code Status Orders  (From admission, onward)         Start     Ordered   10/23/2018 1548  Do not attempt resuscitation (DNR)  Continuous    Question Answer Comment  In the event of cardiac or respiratory ARREST Do not call a "code blue"   In the event of cardiac or respiratory ARREST Do not perform Intubation, CPR, defibrillation or ACLS   In the event of cardiac or respiratory ARREST Use medication by any route, position, wound care, and other measures to relive pain and suffering. May use oxygen, suction and manual treatment of airway obstruction as needed for comfort.      10/19/2018 1548        Code Status History    Date Active Date Inactive Code Status Order ID Comments User Context   10/11/2018 1655 10/15/2018 1938 DNR [id number]  Kayleen Memos, DO Inpatient   10/04/2018 1626 10/08/2018 1938 DNR [id number]  Caren Griffins, MD Inpatient    Advance Directive Documentation     Most Recent Value  Type of Advance Directive  Out of facility DNR (pink MOST or yellow form), Living will  Pre-existing out of facility DNR order (yellow form or pink MOST form)  Yellow form placed in chart (order not valid for inpatient use)  "MOST" Form in Place?  -      Home/SNF/Other Nursing Home  Chief Complaint generalized weakness  Level of Care/Admitting Diagnosis ED Disposition    ED Disposition Condition [place]: [facility] [100102]  Level of Care: Med-Surg [16]  Diagnosis: AKI (acute kidney injury) [facility]) [[id number]]  Admitting Physician: Shelly Coss [[id number]]  Attending Physician: Shelly Coss [[id number]]  Estimated length of stay: past midnight tomorrow  Certification:: I certify this patient will need inpatient services for at least 2 midnights  PT Class (Do Not Modify): Inpatient [101]  PT Acc Code (Do Not Modify): Private [1]        Medical History Past Medical History:  Diagnosis Date  . Acute diastolic CHF (congestive heart failure) ([place])   . Acute exacerbation of CHF (congestive heart failure) ([place]) 10/11/2018  . Acute systolic CHF (congestive heart failure) ([place])   . Aortic valve stenosis 10/13/2018  . CHF (congestive heart failure) ([place])   . CHF exacerbation ([place]) 10/04/2018  . CKD (chronic kidney disease) stage 5, GFR less than 15 ml/min (HCC) 10/05/2018  . COPD with acute exacerbation ([place]) 09/24/2018  . COPD with chronic bronchitis ([place]) 10/04/2018  . Dyspnea 10/13/2018  . Essential hypertension 10/04/2018  . Heart murmur   . Hyperlipidemia 10/13/2018  . Hypervolemia   . Obesity (BMI 30-39.9) 10/04/2018  . OSA (obstructive sleep apnea) 09/24/2018  . Type 2 diabetes mellitus ([place]) 10/13/2018    Allergies Allergies  Allergen Reactions  . Gelatin     Unknown, listed on MAR  . Latex Itching  . Neomycin     Unknown, listed on MAR  . Adhesive [Tape] Rash  . Influenza Vaccines Nausea And Vomiting    Vaccine made her "deathly sick".  She does not take them at all  . Zetia [Ezetimibe] Other (See Comments)    Unknown    IV Location/Drains/Wounds Patient Lines/Drains/Airways Status   Active Line/Drains/Airways    Name:   Placement date:   Placement time:   Site:   Days:  Peripheral IV 10/27/2018 Left Antecubital   11/09/2018    1254    Antecubital   less than 1   External Urinary Catheter   10/04/18    1416    -   20          Labs/Imaging Results for orders placed or performed during the hospital encounter of 11/08/2018 (from the past 48 hour(s))  Basic metabolic panel     Status: Abnormal   Collection Time: 10/29/2018 12:51 PM  Result Value Ref Range   Sodium 136 135 - 145 mmol/L   Potassium >7.5 (HH) 3.5 - 5.1 mmol/L    Comment: CRITICAL RESULT CALLED TO, READ BACK BY AND VERIFIED WITH: LEONARD,S AT 1350 ON 11/02/2018 BY HOOKER,B NO VISIBLE HEMOLYSIS    Chloride 109 98 - 111 mmol/L   CO2 16  (L) 22 - 32 mmol/L   Glucose, Bld 126 (H) 70 - 99 mg/dL   BUN 118 (H) 8 - 23 mg/dL    Comment: RESULTS CONFIRMED BY MANUAL DILUTION   Creatinine, Ser 6.52 (H) 0.44 - 1.00 mg/dL   Calcium 10.0 8.9 - 10.3 mg/dL   GFR calc non Af Amer 5 (L) >60 mL/min   GFR calc Af Amer 6 (L) >60 mL/min    Comment: (NOTE) The eGFR has been calculated using the CKD EPI equation. This calculation has not been validated in all clinical situations. eGFR's persistently <60 mL/min signify possible Chronic Kidney Disease.    Anion gap 11 5 - 15    Comment: Performed at [facility], [place] [address], [place], [place] [address]  CBC with Differential     Status: Abnormal   Collection Time: 11/11/2018 12:51 PM  Result Value Ref Range   WBC 10.4 4.0 - 10.5 K/uL   RBC 2.95 (L) 3.87 - 5.11 MIL/uL   Hemoglobin 8.4 (L) 12.0 - 15.0 g/dL   HCT 29.5 (L) 36.0 - 46.0 %   MCV 100.0 80.0 - 100.0 fL   MCH 28.5 26.0 - 34.0 pg   MCHC 28.5 (L) 30.0 - 36.0 g/dL   RDW 19.3 (H) 11.5 - 15.5 %   Platelets 266 150 - 400 K/uL   nRBC 0.3 (H) 0.0 - 0.2 %   Neutrophils Relative % 84 %   Neutro Abs 8.7 (H) 1.7 - 7.7 K/uL   Lymphocytes Relative 8 %   Lymphs Abs 0.8 0.7 - 4.0 K/uL   Monocytes Relative 7 %   Monocytes Absolute 0.7 0.1 - 1.0 K/uL   Eosinophils Relative 0 %   Eosinophils Absolute 0.0 0.0 - 0.5 K/uL   Basophils Relative 0 %   Basophils Absolute 0.0 0.0 - 0.1 K/uL   Immature Granulocytes 1 %   Abs Immature Granulocytes 0.14 (H) 0.00 - 0.07 K/uL    Comment: Performed at [facility], [place] [address], [place], [place] [address]  Magnesium     Status: Abnormal   Collection Time: 11/12/2018 12:51 PM  Result Value Ref Range   Magnesium 2.9 (H) 1.7 - 2.4 mg/dL    Comment: Performed at [facility], [place] [address] [place], [place] [address]  Brain natriuretic peptide     Status: Abnormal   Collection Time: 10/31/2018 12:52 PM  Result Value Ref Range   B Natriuretic  Peptide 2,454.4 (H) 0.0 - 100.0 pg/mL    Comment: Performed at [facility], [place] [address], [place], [place] [address]  I-stat troponin, ED  Status: None   Collection Time: 11/05/2018  1:00 PM  Result Value Ref Range   Troponin i, poc 0.03 0.00 - 0.08 ng/mL   Comment 3            Comment: Due to the release kinetics of cTnI, a negative result within the first hours of the onset of symptoms does not rule out myocardial infarction with certainty. If myocardial infarction is still suspected, repeat the test at appropriate intervals.   CBG monitoring, ED     Status: Abnormal   Collection Time: 10/20/2018  3:35 PM  Result Value Ref Range   Glucose-Capillary 127 (H) 70 - 99 mg/dL   Dg Chest Port 1 View  Result Date: 11/13/2018 CLINICAL DATA:  Weakness EXAM: PORTABLE CHEST 1 VIEW COMPARISON:  Chest radiograph 10/11/2018 FINDINGS: Monitoring leads overlie the patient. Stable cardiomegaly. Aortic atherosclerosis. Left-greater-than-right heterogeneous opacities mid and lower lungs. No definite pleural effusion. IMPRESSION: Cardiomegaly. Left mid and lower lung heterogeneous opacities may represent atelectasis or infection. Electronically Signed   By: Lovey Newcomer M.D.   On: 10/16/2018 13:26   EKG Interpretation  Date/Time:  Sunday October 24 2018 12:17:24 EST Ventricular Rate:  33 PR Interval:    QRS Duration: 113 QT Interval:  527 QTC Calculation: 391 R Axis:   105 Text Interpretation:  questionable a fib  vs sinus rhythm and artifact  Borderline intraventricular conduction delay Nonspecific T abnormalities, lateral leads Since last tracing rate slower Confirmed by Dorie Rank [phone]) on 11/10/2018 12:20:12 PM   Pending Labs Unresulted Labs (From admission, onward)   None      Vitals/Pain Today's Vitals   11/05/2018 1227 11/10/2018 1330 11/09/2018 1345 11/03/2018 1614  BP:    138/60  Pulse:   (!) 52 (!) 39  Resp:  _0 Temp:      TempSrc:      SpO2:   98% 97%   Weight: 86.2 kg     Height: 5' (1.524 m)     PainSc: 0-No pain       Isolation Precautions No active isolations  Medications Medications  morphine 2 MG/ML injection 2 mg (has no administration in time range)  glycopyrrolate (ROBINUL) injection 0.1 mg (has no administration in time range)  ondansetron (ZOFRAN) injection 4 mg (has no administration in time range)  calcium chloride 1 g in sodium chloride 0.9 % 100 mL IVPB ( Intravenous Stopped 10/26/2018 1452)  sodium bicarbonate injection 50 mEq (50 mEq Intravenous Given 11/11/2018 1426)  insulin aspart (novoLOG) injection 5 Units (5 Units Intravenous Given 11/08/2018 1422)    And  dextrose 50 % solution 50 mL (50 mLs Intravenous Given 11/07/2018 1423)    Mobility non-ambulatory

## 2018-10-24 NOTE — H&P (Signed)
History and Physical    Kathryn Peck IOX:[id number] DOB: 03-12-1928 DOA: 10/27/2018  PCP: [name], FNP   Patient coming from: SNF  Chief Complaint: SOB  HPI: Kathryn Peck is a 84 y.o. female with medical history significant of chronic diastolic CHF, CKD stage V, OSA on CPAP, COPD, hypertension, hyperlipidemia who presented from a facility with complaints of increased shortness of breath, lethargy.  She was just discharged from here on 10/15/2018 after being managed for acute exacerbation of chronic diastolic CHF, AKI on CKD.  Patient has recurrent admissions recently for the same. Her blood work showed severe hyperkalemia and severe acute kidney injury on CKD. Patient seen and examined the bedside in the emergency department.  She was very lethargic look dyspneic.  She was found to be bradycardic and was saturating in the lower 90s on supplemental oxygen. As per the daughter she was complaining of shortness of breath. She did not complain of chest pain or had fever or chills at the facility.   ED Course: Found to have severe hyperkalemia, acute kidney injury.  Given cocktail of calcium gluconate, IV insulin and glucose.   Past Medical History:  Diagnosis Date  . Acute diastolic CHF (congestive heart failure) ([place])   . Acute exacerbation of CHF (congestive heart failure) ([place]) 10/11/2018  . Acute systolic CHF (congestive heart failure) ([place])   . Aortic valve stenosis 10/13/2018  . CHF (congestive heart failure) ([place])   . CHF exacerbation ([place]) 10/04/2018  . CKD (chronic kidney disease) stage 5, GFR less than 15 ml/min (HCC) 10/05/2018  . COPD with acute exacerbation ([place]) 09/24/2018  . COPD with chronic bronchitis ([place]) 10/04/2018  . Dyspnea 10/13/2018  . Essential hypertension 10/04/2018  . Heart murmur   . Hyperlipidemia 10/13/2018  . Hypervolemia   . Obesity (BMI 30-39.9) 10/04/2018  . OSA (obstructive sleep apnea) 09/24/2018  . Type 2 diabetes mellitus ([place])  10/13/2018    Past Surgical History:  Procedure Laterality Date  . CATARACT EXTRACTION    . EYE SURGERY    . TONSILLECTOMY       reports that she has never smoked. She has never used smokeless tobacco. She reports that she does not drink alcohol or use drugs.  Allergies  Allergen Reactions  . Gelatin     Unknown, listed on MAR  . Latex Itching  . Neomycin     Unknown, listed on MAR  . Adhesive [Tape] Rash  . Influenza Vaccines Nausea And Vomiting    Vaccine made her "deathly sick".  She does not take them at all  . Zetia [Ezetimibe] Other (See Comments)    Unknown    Family History  Problem Relation Age of Onset  . Heart disease Father      Prior to Admission medications   Medication Sig Start Date End Date Taking? Authorizing Provider  acetaminophen (TYLENOL) 325 MG tablet Take 650 mg by mouth 2 (two) times daily.    Yes [provider]  amLODipine (NORVASC) 10 MG tablet Take 10 mg by mouth every morning.    Yes [provider]  aspirin 81 MG chewable tablet Chew 81 mg by mouth every morning.    Yes [provider]  bumetanide (BUMEX) 2 MG tablet Take 1 tablet (2 mg total) by mouth 2 (two) times daily. 10/08/18  Yes Sheikh, Omair Latif, DO  calcium acetate (PHOSLO) 667 MG capsule Take 1 capsule (667 mg total) by mouth 3 (three) times daily with meals. 10/15/18  Yes Lama,  Marge Duncans, MD  carvedilol (COREG) 12.5 MG tablet Take 1 tablet (12.5 mg total) by mouth 2 (two) times daily with a meal. 10/15/18  Yes Darrick Meigs, Marge Duncans, MD  colesevelam (WELCHOL) 625 MG tablet Take 625 mg by mouth 2 (two) times daily as needed (Cholesterol).  09/22/18  Yes [provider]  ferrous sulfate 325 (65 FE) MG tablet Take 1 tablet (325 mg total) by mouth daily with breakfast. 10/16/18  Yes Darrick Meigs, Marge Duncans, MD  hydrALAZINE (APRESOLINE) 100 MG tablet Take 100 mg by mouth 2 (two) times daily.   Yes [provider]  isosorbide mononitrate (IMDUR) 30 MG 24 hr tablet  Take 30 mg by mouth daily.   Yes [provider]  latanoprost (XALATAN) 0.005 % ophthalmic solution Place 1 drop into both eyes at bedtime.    Yes [provider]  levalbuterol (XOPENEX) 0.63 MG/3ML nebulizer solution Take 3 mLs (0.63 mg total) by nebulization every 6 (six) hours as needed for wheezing or shortness of breath. 10/08/18  Yes Sheikh, Omair Latif, DO  loperamide (IMODIUM A-D) 2 MG tablet Take 2 mg by mouth 2 (two) times daily as needed for diarrhea or loose stools.   Yes [provider]  Misc Natural Products (OSTEO BI-FLEX JOINT SHIELD PO) Take 1 tablet by mouth 2 (two) times daily.   Yes [provider]  Multiple Vitamins-Minerals (CENTRUM SILVER ULTRA WOMENS PO) Take 1 tablet by mouth every morning.    Yes [provider]  potassium chloride SA (K-DUR,KLOR-CON) 20 MEQ tablet Take 1 tablet (20 mEq total) by mouth 2 (two) times daily. 10/08/18  Yes Sheikh, Omair Latif, DO  Probiotic Product (PROBIOTIC COLON SUPPORT PO) Take 1 capsule by mouth every morning.    Yes [provider]  simvastatin (ZOCOR) 10 MG tablet Take 10 mg by mouth at bedtime.    Yes [provider]  Tiotropium Bromide Monohydrate (SPIRIVA RESPIMAT) 2.5 MCG/ACT AERS Inhale 2 puffs into the lungs daily. 09/23/18 11/08/2018 Yes Fenton Foy, NP  Vitamin D, Ergocalciferol, (DRISDOL) 50000 units CAPS capsule Take 50,000 Units by mouth every 7 (seven) days. On Thursdays   Yes [provider]    Physical Exam: Vitals:   10/21/2018 1210 10/20/2018 1227 11/03/2018 1330 11/05/2018 1345  BP: (!) 140/50     Pulse: (!) 46   (!) 52  Resp:   18 16  Temp: 97.6 F (36.4 C)     TempSrc: Oral     SpO2: 96%   98%  Weight:  86.2 kg    Height:  5' (1.524 m)      Constitutional: Weak, lethargic, looking Vitals:   10/30/2018 1210 10/27/2018 1227 11/02/2018 1330 11/10/2018 1345  BP: (!) 140/50     Pulse: (!) 46   (!) 52  Resp:   18 16  Temp: 97.6 F (36.4 C)       TempSrc: Oral     SpO2: 96%   98%  Weight:  86.2 kg    Height:  5' (1.524 m)     Eyes: PERRL, lids and conjunctivae normal Neck: normal, supple, no masses, no thyromegaly Respiratory: Decreased air entry bilaterally.  Cardiovascular: Regular rate and rhythm, no murmurs / rubs / gallops.  Severe edema Abdomen: no tenderness, no masses palpated. No hepatosplenomegaly. Bowel sounds positive.  Musculoskeletal: no clubbing / cyanosis.  Skin: Scattered ecchymosis  neurologic: CN 2-12 grossly intact. Sensation intact Psychiatric: . Alert and oriented x 2.   Foley Catheter:None  Labs  on Admission: I have personally reviewed following labs and imaging studies  CBC: Recent Labs  Lab 10/26/2018 1251  WBC 10.4  NEUTROABS 8.7*  HGB 8.4*  HCT 29.5*  MCV 100.0  PLT 443   Basic Metabolic Panel: Recent Labs  Lab 10/22/2018 1251  NA 136  K >7.5*  CL 109  CO2 16*  GLUCOSE 126*  BUN 118*  CREATININE 6.52*  CALCIUM 10.0  MG 2.9*   GFR: Estimated Creatinine Clearance: 5.8 mL/min (A) (by C-G formula based on SCr of 6.52 mg/dL (H)). Liver Function Tests: No results for input(s): AST, ALT, ALKPHOS, BILITOT, PROT, ALBUMIN in the last 168 hours. No results for input(s): LIPASE, AMYLASE in the last 168 hours. No results for input(s): AMMONIA in the last 168 hours. Coagulation Profile: No results for input(s): INR, PROTIME in the last 168 hours. Cardiac Enzymes: No results for input(s): CKTOTAL, CKMB, CKMBINDEX, TROPONINI in the last 168 hours. BNP (last 3 results) No results for input(s): PROBNP in the last 8760 hours. HbA1C: No results for input(s): HGBA1C in the last 72 hours. CBG: Recent Labs  Lab 11/11/2018 1535  GLUCAP 127*   Lipid Profile: No results for input(s): CHOL, HDL, LDLCALC, TRIG, CHOLHDL, LDLDIRECT in the last 72 hours. Thyroid Function Tests: No results for input(s): TSH, T4TOTAL, FREET4, T3FREE, THYROIDAB in the last 72 hours. Anemia Panel: No results for  input(s): VITAMINB12, FOLATE, FERRITIN, TIBC, IRON, RETICCTPCT in the last 72 hours. Urine analysis:    Component Value Date/Time   COLORURINE YELLOW 10/04/2018 1742   APPEARANCEUR CLEAR 10/04/2018 1742   LABSPEC 1.011 10/04/2018 1742   PHURINE 5.0 10/04/2018 1742   GLUCOSEU NEGATIVE 10/04/2018 1742   HGBUR NEGATIVE 10/04/2018 1742   BILIRUBINUR NEGATIVE 10/04/2018 1742   KETONESUR NEGATIVE 10/04/2018 1742   PROTEINUR 30 (A) 10/04/2018 1742   NITRITE NEGATIVE 10/04/2018 1742   LEUKOCYTESUR NEGATIVE 10/04/2018 1742    Radiological Exams on Admission: Dg Chest Port 1 View  Result Date: 10/17/2018 CLINICAL DATA:  Weakness EXAM: PORTABLE CHEST 1 VIEW COMPARISON:  Chest radiograph 10/11/2018 FINDINGS: Monitoring leads overlie the patient. Stable cardiomegaly. Aortic atherosclerosis. Left-greater-than-right heterogeneous opacities mid and lower lungs. No definite pleural effusion. IMPRESSION: Cardiomegaly. Left mid and lower lung heterogeneous opacities may represent atelectasis or infection. Electronically Signed   By: Lovey Newcomer M.D.   On: 10/20/2018 13:26     Assessment/Plan Principal Problem:   AKI (acute kidney injury) ([place]) Active Problems:   CHF exacerbation (HCC)   COPD with chronic bronchitis (HCC)   CKD (chronic kidney disease) stage 5, GFR less than 15 ml/min (HCC)   Hyperkalemia  Goals of Care: I have discussed with daughter extensively about her current condition and prognosis.  Her daughter is against any aggressive medical treatment, no dialysis .She wants her to be just comfortable.  We also discussed that we will not do any blood works. I discussed about palliative care discussion and idea of hospice. She is interested on hospice house but at the same time is also interested to hear from palliative physicians about the goals of care because this is a big transition in her mother's care. I have requested consult for palliative care and also social worker for hospice.   If there is delay on evaluation by palliative care tomorrow and the daughter remains interested on hospice house we can directly consider transferring her to hospice. I have ordered morphine for dyspnea.  Continue glycopyrrolate and Zofran.  We will admit her to MedSurg.  Acute  kidney injury on CKD/hyperkalemia: Most likely secondary to cardiorenal syndrome due to severe congestive heart failure. She was given IV insulin, calcium gluconate in the emergency department.  After extensive discussion with her daughter, we want to be doing any blood works or giving any medications for this.  No plan for dialysis or escalating any treatment. I have sent a text message to Dr. Posey Pronto regarding her current situation and plan.  History of chronic diastolic CHF/OSA/pulmonary hypertension: Was following with cardiology as an outpatient.   Severity of Illness: The appropriate patient status for this patient is INPATIENT. I   DVT prophylaxis: None Code Status: DNR/Comfort care Family Communication: Daughter present at the bedside Consults called: Palliative care     Shelly Coss MD [facility] Pager [phone]  If 7PM-7AM, please contact night-coverage [email or web address] Password [facility]  10/19/2018, 3:49 PM

## 2018-10-25 DIAGNOSIS — E875 Hyperkalemia: Secondary | ICD-10-CM

## 2018-10-25 DIAGNOSIS — N179 Acute kidney failure, unspecified: Secondary | ICD-10-CM

## 2018-10-25 DIAGNOSIS — R001 Bradycardia, unspecified: Secondary | ICD-10-CM

## 2018-10-25 DIAGNOSIS — N185 Chronic kidney disease, stage 5: Secondary | ICD-10-CM

## 2018-10-25 LAB — MRSA PCR SCREENING: MRSA by PCR: NEGATIVE

## 2018-10-25 MED ORDER — SODIUM CHLORIDE 0.9 % IV SOLN
25.0000 mg | INTRAVENOUS | Status: DC | PRN
  Administered 2018-10-25: 25 mg via INTRAVENOUS
  Filled 2018-10-25: qty 25

## 2018-10-25 MED ORDER — MORPHINE SULFATE (PF) 4 MG/ML IV SOLN
4.0000 mg | INTRAVENOUS | Status: DC | PRN
  Administered 2018-10-25 (×3): 4 mg via INTRAVENOUS
  Filled 2018-10-25 (×3): qty 1

## 2018-10-25 MED ORDER — SCOPOLAMINE 1 MG/3DAYS TD PT72: 1.0000 | MEDICATED_PATCH | TRANSDERMAL | 12 refills | Status: AC

## 2018-10-25 MED ORDER — MORPHINE SULFATE (PF) 2 MG/ML IV SOLN: 2.0000 mg | INTRAVENOUS | 0 refills | Status: DC | PRN

## 2018-10-25 MED ORDER — SCOPOLAMINE 1 MG/3DAYS TD PT72
1.0000 | MEDICATED_PATCH | TRANSDERMAL | Status: DC
  Administered 2018-10-25: 1.5 mg via TRANSDERMAL
  Filled 2018-10-25: qty 1

## 2018-10-25 MED ORDER — MORPHINE SULFATE (PF) 4 MG/ML IV SOLN: 4.0000 mg | INTRAVENOUS | 0 refills | Status: AC | PRN

## 2018-10-25 MED ORDER — HYDROXYZINE HCL 10 MG/5ML PO SYRP
10.0000 mg | ORAL_SOLUTION | Freq: Three times a day (TID) | ORAL | Status: DC | PRN
  Filled 2018-10-25: qty 5

## 2018-10-25 MED ORDER — HYDROXYZINE HCL 10 MG/5ML PO SYRP: 10.0000 mg | ORAL_SOLUTION | Freq: Three times a day (TID) | ORAL | 0 refills | Status: AC | PRN

## 2018-10-26 NOTE — Progress Notes (Signed)
RN notified by NT that patient's breathing pattern had changed at 2209. Upon assessment, no lung or heart sounds audible upon auscultation of chest and no palpable pulses noted to radial or carotid. No measurable spo2 or blood pressure. Patient pronounced as expired by primary RN and charge nurse Arrie Aran, RN. Patient cleaned for presentation. Daughter- Deretha Emory notified as soon as patient confirmed to have expired, who said she would gather her family and be on the way to the hospital. Provider on call, Lamar Blinks, NP notified 2240 and prepared death certificate.  [facility] contacted by primary RN and pt found to not be a candidate for donation. Family presented to room and claimed pt's rings as well as glasses and partial dentures to take home with them.  Funeral Home information provided by daughter is in [place]. Daughter called funeral home and patient placement. Scopolamine patient and PIV removed. Patient transferred to morgue and patient placement notified.

## 2018-11-03 NOTE — Discharge Summary (Signed)
Death Summary  Cerenity Dinovo OIZ:[id number] DOB: February 17, 1928 DOA: 2018-10-30  PCP: [name], FNP  Admit date: Oct 30, 2018 Date of Death: 2018-11-09 Time of Death: Nov 01, 2018 at 1:10 am Notification: [name], FNP notified of death of November 09, 2018   History of present illness:  84 y.o.femalepast medical history of chronic diastolic heart failure, chronic stage IV kidney disease, obstructive sleep apnea, COPD recently discharged from the hospital on 10/15/2018 who presents with shortness of breath, labs in the ED show a potassium of greater than 7.5, worsening and progressive medical condition it was discussed with the daughter and she would like to proceed with comfort measures at a residential hospice facility.  Final Diagnoses:  Acute kidney injury/hyperkalemia/chronic kidney disease stage V/acute diastolic heart failure: The patient was admitted to the hospital and the family requested no further intervention as she has been declining for the last several months. They want no further therapy or aggressive intervention.  They wanted no addition of medication or lab withdraws. The patient was placed on morphine for shortness of breath or agitation and Ativan as needed. She was scheduled to be transferred to be can wait she passed away in house.    The results of significant diagnostics from this hospitalization (including imaging, microbiology, ancillary and laboratory) are listed below for reference.    Significant Diagnostic Studies: Dg Chest 2 View  Result Date: 10/11/2018 CLINICAL DATA:  Shortness of breath. EXAM: CHEST - 2 VIEW COMPARISON:  Radiograph of October 08, 2018. FINDINGS: Stable cardiomegaly. Atherosclerosis of thoracic aorta is noted. No pneumothorax is noted. Small bilateral pleural effusions are noted. No consolidative process is noted. Bony thorax is unremarkable. IMPRESSION: Minimal bilateral pleural effusions. No other significant acute abnormality  seen. Aortic Atherosclerosis (ICD10-I70.0). Electronically Signed   By: Marijo Conception, M.D.   On: 10/11/2018 13:39   Dg Knee 1-2 Views Right  Result Date: 10/07/2018 CLINICAL DATA:  Diffuse right knee pain while walking EXAM: RIGHT KNEE - 1-2 VIEW COMPARISON:  None. FINDINGS: Tricompartmental degenerative changes are noted most marked in the medial joint space. No joint effusion is seen. No acute fracture or dislocation is noted. IMPRESSION: Degenerative change without acute abnormality. Electronically Signed   By: Inez Catalina M.D.   On: 10/07/2018 14:58   US Renal  Result Date: 10/12/2018 CLINICAL DATA:  84 year old female with acute kidney insufficiency. Initial encounter. EXAM: RENAL / URINARY TRACT ULTRASOUND COMPLETE COMPARISON:  None. FINDINGS: Right Kidney: Length: 9.4 cm. Increased echogenicity of renal parenchyma. No hydronephrosis or mass. Left Kidney: Length: 8.9 cm. Difficult to visualize secondary to habitus/bowel gas. Increased echogenicity of renal parenchyma. No obvious hydronephrosis or mass. Bladder: Appears normal for degree of bladder distention. Bilateral pleural effusions. IMPRESSION: 1. Left kidney difficult to visualize secondary to habitus/bowel gas. Taking this limitation into account, no hydronephrosis is noted on either side. 2. Increased renal parenchyma echogenicity may reflect changes of medical renal disease. 3. Bilateral pleural effusions. Electronically Signed   By: Genia Del M.D.   On: 10/12/2018 12:59   Dg Chest Port 1 View  Result Date: 2018-10-30 CLINICAL DATA:  Weakness EXAM: PORTABLE CHEST 1 VIEW COMPARISON:  Chest radiograph 10/11/2018 FINDINGS: Monitoring leads overlie the patient. Stable cardiomegaly. Aortic atherosclerosis. Left-greater-than-right heterogeneous opacities mid and lower lungs. No definite pleural effusion. IMPRESSION: Cardiomegaly. Left mid and lower lung heterogeneous opacities may represent atelectasis or infection. Electronically  Signed   By: Lovey Newcomer M.D.   On: 2018/10/30 13:26   Dg Chest [facility]  Result Date: 10/08/2018 CLINICAL DATA:  Shortness of breath. EXAM: PORTABLE CHEST 1 VIEW COMPARISON:  10/04/2018 FINDINGS: Lungs are adequately inflated with mild hazy opacification over the right base which may be due to atelectasis or infection. No effusion. Prominence of the right hilum and prominent main pulmonary artery segment. Cardiomediastinal silhouette and remainder of the exam is unchanged. IMPRESSION: Mild hazy right base opacification which may be due to atelectasis or infection. Prominent main pulmonary artery segment which may be due to pulmonary arterial hypertension. Prominent right hilum which also may be part of pulmonary arterial hypertension, although mass or adenopathy is possible. Recommend contrast and chest CT on elective basis for further evaluation. Electronically Signed   By: Marin Olp M.D.   On: 10/08/2018 09:56   Vas [place] Lower Extremity Venous (dvt)  Result Date: 10/07/2018  Lower Venous Study Indications: Swelling.  Limitations: Body habitus and poor ultrasound/tissue interface. Performing Technologist: Oliver Hum RVT  Examination Guidelines: A complete evaluation includes B-mode imaging, spectral Doppler, color Doppler, and power Doppler as needed of all accessible portions of each vessel. Bilateral testing is considered an integral part of a complete examination. Limited examinations for reoccurring indications may be performed as noted.  Right Venous Findings: +---------+---------------+---------+-----------+----------+-------+          CompressibilityPhasicitySpontaneityPropertiesSummary +---------+---------------+---------+-----------+----------+-------+ CFV      Full           Yes      Yes                          +---------+---------------+---------+-----------+----------+-------+ SFJ      Full                                                  +---------+---------------+---------+-----------+----------+-------+ FV Prox  Full                                                 +---------+---------------+---------+-----------+----------+-------+ FV Mid   Full                                                 +---------+---------------+---------+-----------+----------+-------+ FV DistalFull                                                 +---------+---------------+---------+-----------+----------+-------+ PFV      Full                                                 +---------+---------------+---------+-----------+----------+-------+ POP      Full           Yes      Yes                          +---------+---------------+---------+-----------+----------+-------+ PTV  Full                                                 +---------+---------------+---------+-----------+----------+-------+ PERO     Full                                                 +---------+---------------+---------+-----------+----------+-------+  Left Venous Findings: +---------+---------------+---------+-----------+----------+-------+          CompressibilityPhasicitySpontaneityPropertiesSummary +---------+---------------+---------+-----------+----------+-------+ CFV      Full           Yes      Yes                          +---------+---------------+---------+-----------+----------+-------+ SFJ      Full                                                 +---------+---------------+---------+-----------+----------+-------+ FV Prox  Full                                                 +---------+---------------+---------+-----------+----------+-------+ FV Mid   Full                                                 +---------+---------------+---------+-----------+----------+-------+ FV DistalFull                                                  +---------+---------------+---------+-----------+----------+-------+ PFV      Full                                                 +---------+---------------+---------+-----------+----------+-------+ POP      Full           Yes      Yes                          +---------+---------------+---------+-----------+----------+-------+ PTV      Full                                                 +---------+---------------+---------+-----------+----------+-------+ PERO     Full                                                 +---------+---------------+---------+-----------+----------+-------+  Summary: Right: There is no evidence of deep vein thrombosis in the lower extremity. No cystic structure found in the popliteal fossa. Left: There is no evidence of deep vein thrombosis in the lower extremity. No cystic structure found in the popliteal fossa.  *See table(s) above for measurements and observations. Electronically signed by Servando Snare MD on 10/07/2018 at 1:09:03 PM.    Final     Microbiology: Recent Results (from the past 240 hour(s))  MRSA PCR Screening     Status: None   Collection Time: 11/09/2018  6:22 AM  Result Value Ref Range Status   MRSA by PCR NEGATIVE NEGATIVE Final    Comment:        The GeneXpert MRSA Assay (FDA approved for NASAL specimens only), is one component of a comprehensive MRSA colonization surveillance program. It is not intended to diagnose MRSA infection nor to guide or monitor treatment for MRSA infections. Performed at [facility], [place] [address], [place], [place] [address]      Labs: Basic Metabolic Panel: No results for input(s): NA, K, CL, CO2, GLUCOSE, BUN, CREATININE, CALCIUM, MG, PHOS in the last 168 hours. Liver Function Tests: No results for input(s): AST, ALT, ALKPHOS, BILITOT, PROT, ALBUMIN in the last 168 hours. No results for input(s): LIPASE, AMYLASE in the last 168 hours. No results for input(s):  AMMONIA in the last 168 hours. CBC: No results for input(s): WBC, NEUTROABS, HGB, HCT, MCV, PLT in the last 168 hours. Cardiac Enzymes: No results for input(s): CKTOTAL, CKMB, CKMBINDEX, TROPONINI in the last 168 hours. D-Dimer No results for input(s): DDIMER in the last 72 hours. BNP: Invalid input(s): POCBNP CBG: No results for input(s): GLUCAP in the last 168 hours. Anemia work up No results for input(s): VITAMINB12, FOLATE, FERRITIN, TIBC, IRON, RETICCTPCT in the last 72 hours. Urinalysis    Component Value Date/Time   COLORURINE YELLOW 10/04/2018 1742   APPEARANCEUR CLEAR 10/04/2018 1742   LABSPEC 1.011 10/04/2018 1742   PHURINE 5.0 10/04/2018 1742   GLUCOSEU NEGATIVE 10/04/2018 1742   HGBUR NEGATIVE 10/04/2018 1742   BILIRUBINUR NEGATIVE 10/04/2018 1742   KETONESUR NEGATIVE 10/04/2018 1742   PROTEINUR 30 (A) 10/04/2018 1742   NITRITE NEGATIVE 10/04/2018 1742   LEUKOCYTESUR NEGATIVE 10/04/2018 1742   Sepsis Labs Invalid input(s): PROCALCITONIN,  WBC,  LACTICIDVEN     SIGNED:  Charlynne Cousins, MD  [facility] 11/03/2018, 4:46 PM Pager   If 7PM-7AM, please contact night-coverage [email or web address] Password [email or web address]

## 2018-11-14 NOTE — Progress Notes (Addendum)
Clinical Social Worker received consult to assist with placing patient into a residential hospice facility. CSW contacted [employer] and made a referral to [facility]. Harmon Pier stated she will look at patient and get back to CSW to see if patient is appropriate for residential hospice facility.   11:13 am: Per Harmon Pier [facility]) they are currently full. Harmon Pier stated that patient is appropriate for Hospice placement. Harmon Pier stated she will continue to follow patient until there is bed available for.   2:00pm: CSW spoke with patient's family at bedside. Family is aware that [facility] does not have a bed avalaible for patient today. CSW stated to family that [facility] will reach out tot CSW tomorrow to make her aware of bed availability. CSW stated to family that if [facility] stays full again tomorrow that they might need to start looking at other Hospice facilities. Family stated they understand and hopes they can get patient into [facility] since it is close to family  Rhea Pink, MSW,  [place]

## 2018-11-14 NOTE — Progress Notes (Signed)
Dr. Lonny Prude paged to notify that patient has had increasing difficulty breathing throughout shift and has been treated with morphine per prn order. Pt has not had any urine output in >12 hours and bladder scan shows 50 mL only. No new orders pertinent to urine output. Will continue to monitor closely.  As last dose of morphine flushed, PIV was noted to be leaking- MD advised to have another PIV placed by IV team since placement in hospice is not guaranteed and IVP morphine is necessary more frequently now. Will place IV team order for IV placement due to difficult stick and comfort care.

## 2018-11-14 NOTE — Care Management Note (Signed)
Case Management Note  Patient Details  Name: Deserea Bordley MRN: [id number] Date of Birth: 03-09-1928  Subjective/Objective: Noted d/c plan residential hospice-CSW already following.                   Action/Plan:dc Residential hospice facility.   Expected Discharge Date:  2018-11-08               Expected Discharge Plan:  [place]  In-House Referral:  Clinical Social Work  Discharge planning Services  CM Consult  Post Acute Care Choice:    Choice offered to:     DME Arranged:    DME Agency:     HH Arranged:    [place] Agency:     Status of Service:  Completed, signed off  If discussed at [employer], dates discussed:    Additional Comments:  Dessa Phi, RN 11-08-18, 11:19 AM

## 2018-11-14 NOTE — Progress Notes (Addendum)
[facility]  1100 Received request from [place] for family interest in [facility]. Eligibility confirmed. Unfortunately [facility] does not have a room to offer at this time. CSW [place] aware. Will follow up with family and update Caryl Pina if availability changes.   32 Met with daughter Marliss Coots and two other family members at bedside to explain services, answer questions and offer support. Plan to follow up with family in am for update on availability.   Thank you,  Erling Conte, LCSW [phone]

## 2018-11-14 NOTE — Progress Notes (Signed)
[facility] PROGRESS NOTE    Progress Note  Kathryn Peck  EXN:[id number] DOB: 10/12/1928 DOA: 10/29/2018 PCP: [name], FNP     Brief Narrative:   Kathryn Peck is an 84 y.o. female past medical history of chronic diastolic heart failure, chronic stage IV kidney disease, obstructive sleep apnea, COPD recently discharged from the hospital on 10/15/2018 who presents with shortness of breath, labs in the ED show a potassium of greater than 7.5, worsening and progressive medical condition it was discussed with the daughter and she would like to proceed with comfort measures at a residential hospice facility.  Assessment/Plan:   Goals of care: The admitting physician had a long discussion with the family about her prognosis, and the daughter relates that she wants the patient to be comfort care, she does not want any further blood work.  And would like her at a residential hospice facility.  AKI (acute kidney injury) (HCC)/hyperkalemia/CKD (chronic kidney disease) stage 5, GFR less than 15 ml/min ([employer]): In the setting of cardiorenal syndrome.  She was given IV calcium, IV insulin and dextrose in the emergency department. After the admitting physician spoke to the daughter about her poor prognosis she decided on no further aggressive treatment or interventions including labs.  CHF exacerbation [facility]):  DVT prophylaxis: none Family Communication:daughter Disposition Plan/Barrier to D/C: residential hospice  Code Status:     Code Status Orders  (From admission, onward)         Start     Ordered   11/11/2018 1548  Do not attempt resuscitation (DNR)  Continuous    Question Answer Comment  In the event of cardiac or respiratory ARREST Do not call a "code blue"   In the event of cardiac or respiratory ARREST Do not perform Intubation, CPR, defibrillation or ACLS   In the event of cardiac or respiratory ARREST Use medication by any route, position, wound care, and other  measures to relive pain and suffering. May use oxygen, suction and manual treatment of airway obstruction as needed for comfort.      10/28/2018 1548        Code Status History    Date Active Date Inactive Code Status Order ID Comments User Context   10/11/2018 1655 10/15/2018 1938 DNR [id number]  Kayleen Memos, DO Inpatient   10/04/2018 1626 10/08/2018 1938 DNR [id number]  Caren Griffins, MD Inpatient    Advance Directive Documentation     Most Recent Value  Type of Advance Directive  Out of facility DNR (pink MOST or yellow form), Living will  Pre-existing out of facility DNR order (yellow form or pink MOST form)  Yellow form placed in chart (order not valid for inpatient use)  "MOST" Form in Place?  -        IV Access:    Peripheral IV   Procedures and diagnostic studies:   Dg Chest Port 1 View  Result Date: 10/15/2018 CLINICAL DATA:  Weakness EXAM: PORTABLE CHEST 1 VIEW COMPARISON:  Chest radiograph 10/11/2018 FINDINGS: Monitoring leads overlie the patient. Stable cardiomegaly. Aortic atherosclerosis. Left-greater-than-right heterogeneous opacities mid and lower lungs. No definite pleural effusion. IMPRESSION: Cardiomegaly. Left mid and lower lung heterogeneous opacities may represent atelectasis or infection. Electronically Signed   By: Lovey Newcomer M.D.   On: 10/29/2018 13:26     Medical Consultants:    None.  Anti-Infectives:  none  Subjective:    Krystine Pfannenstiel nonverbal  Objective:    Vitals:   10/21/2018 1614 10/26/2018 1714  10/16/2018 2033 2018-11-13 0540  BP: 138/60 (!) 137/50 115/75 (!) 144/78  Pulse: (!) 39 (!) 40 88 62  Resp: 18 (!) 24 (!) 22 (!) 22  Temp:  98.2 F (36.8 C) (!) 97.5 F (36.4 C) (!) 97.4 F (36.3 C)  TempSrc:  Oral Oral Oral  SpO2: 97% 98% 99% 94%  Weight:      Height:        Intake/Output Summary (Last 24 hours) at Nov 13, 2018 0832 Last data filed at 10/22/2018 2033 Gross per 24 hour  Intake 97.58 ml  Output 120 ml  Net  -22.42 ml   Filed Weights   10/22/2018 1227  Weight: 86.2 kg    Exam: General exam: In no acute distress. Respiratory system: Good air movement and clear to auscultation. Cardiovascular system: S1 & S2 heard, RRR.  Gastrointestinal system: Abdomen is nondistended, soft and nontender.  Extremities: No pedal edema. Skin: No rashes, lesions or ulcers   Data Reviewed:    Labs: Basic Metabolic Panel: Recent Labs  Lab 11/04/2018 1251  NA 136  K >7.5*  CL 109  CO2 16*  GLUCOSE 126*  BUN 118*  CREATININE 6.52*  CALCIUM 10.0  MG 2.9*   GFR Estimated Creatinine Clearance: 5.8 mL/min (A) (by C-G formula based on SCr of 6.52 mg/dL (H)). Liver Function Tests: No results for input(s): AST, ALT, ALKPHOS, BILITOT, PROT, ALBUMIN in the last 168 hours. No results for input(s): LIPASE, AMYLASE in the last 168 hours. No results for input(s): AMMONIA in the last 168 hours. Coagulation profile No results for input(s): INR, PROTIME in the last 168 hours.  CBC: Recent Labs  Lab 10/30/2018 1251  WBC 10.4  NEUTROABS 8.7*  HGB 8.4*  HCT 29.5*  MCV 100.0  PLT 266   Cardiac Enzymes: No results for input(s): CKTOTAL, CKMB, CKMBINDEX, TROPONINI in the last 168 hours. BNP (last 3 results) No results for input(s): PROBNP in the last 8760 hours. CBG: Recent Labs  Lab 10/19/2018 1535  GLUCAP 127*   D-Dimer: No results for input(s): DDIMER in the last 72 hours. Hgb A1c: No results for input(s): HGBA1C in the last 72 hours. Lipid Profile: No results for input(s): CHOL, HDL, LDLCALC, TRIG, CHOLHDL, LDLDIRECT in the last 72 hours. Thyroid function studies: No results for input(s): TSH, T4TOTAL, T3FREE, THYROIDAB in the last 72 hours.  Invalid input(s): FREET3 Anemia work up: No results for input(s): VITAMINB12, FOLATE, FERRITIN, TIBC, IRON, RETICCTPCT in the last 72 hours. Sepsis Labs: Recent Labs  Lab 10/28/2018 1251  WBC 10.4   Microbiology No results found for this or any  previous visit (from the past 240 hour(s)).   Medications:   . glycopyrrolate  0.1 mg Intravenous BID   Continuous Infusions:    LOS: 1 day   Charlynne Cousins  [facility]   *Please refer to [email or web address], password [email or web address] to get updated schedule on who will round on this patient, as hospitalists switch teams weekly. If 7PM-7AM, please contact night-coverage at [email or web address], password [email or web address] for any overnight needs.  2018/11/13, 8:32 AM

## 2018-11-14 NOTE — Discharge Summary (Addendum)
Physician Discharge Summary  Kathryn Peck BZJ:[id number] DOB: December 13, 1928 DOA: 11/08/2018  PCP: [name], FNP  Admit date: 10/17/2018 Discharge date: 11/21/18  Admitted From: home Disposition:  Home  Recommendations for Outpatient Follow-up:  1. She will go to residential hospice facility.  Home Health:no Equipment/Devices:none  Discharge Condition:Guarded CODE STATUS:DNR Diet recommendation: Heart Healthy   Brief/Interim Summary: 84 y.o. female past medical history of chronic diastolic heart failure, chronic stage IV kidney disease, obstructive sleep apnea, COPD recently discharged from the hospital on 10/15/2018 who presents with shortness of breath, labs in the ED show a potassium of greater than 7.5, worsening and progressive medical condition it was discussed with the daughter and she would like to proceed with comfort measures at a residential hospice facility.  Discharge Diagnoses:  Principal Problem:   AKI (acute kidney injury) ([place]) Active Problems:   CHF exacerbation (HCC)   COPD with chronic bronchitis (HCC)   CKD (chronic kidney disease) stage 5, GFR less than 15 ml/min (HCC)   Hyperkalemia Acute kidney injury/hyperkalemia/chronic kidney disease stage V/acute diastolic heart failure: Goals of care was discussed with the daughter on admission and she would not like her mother to be treated aggressively. She relates that she has had a good life she just wants her to be comfortable. She also relates she does not want labs to be drawn or any further medication added. She would like her mother to be comfortable in her last few days. The patient is not eating or drinking.  She is laying in bed only responsive to pain but cannot carry on a conversation. We will continue morphine for pain. Social worker was consulted for residential hospice facility placement.   Discharge Instructions  Discharge Instructions    Diet - low sodium heart healthy   Complete by:   As directed    Increase activity slowly   Complete by:  As directed      Allergies as of 21-Nov-2018      Reactions   Gelatin    Unknown, listed on MAR   Latex Itching   Neomycin    Unknown, listed on MAR   Adhesive [tape] Rash   Influenza Vaccines Nausea And Vomiting   Vaccine made her "deathly sick".  She does not take them at all   Zetia [ezetimibe] Other (See Comments)   Unknown      Medication List    STOP taking these medications   acetaminophen 325 MG tablet Commonly known as:  TYLENOL   amLODipine 10 MG tablet Commonly known as:  NORVASC   aspirin 81 MG chewable tablet   bumetanide 2 MG tablet Commonly known as:  BUMEX   calcium acetate 667 MG capsule Commonly known as:  PHOSLO   carvedilol 12.5 MG tablet Commonly known as:  COREG   CENTRUM SILVER ULTRA WOMENS PO   colesevelam 625 MG tablet Commonly known as:  WELCHOL   ferrous sulfate 325 (65 FE) MG tablet   hydrALAZINE 100 MG tablet Commonly known as:  APRESOLINE   isosorbide mononitrate 30 MG 24 hr tablet Commonly known as:  IMDUR   latanoprost 0.005 % ophthalmic solution Commonly known as:  XALATAN   levalbuterol 0.63 MG/3ML nebulizer solution Commonly known as:  XOPENEX   loperamide 2 MG tablet Commonly known as:  IMODIUM A-D   OSTEO BI-FLEX JOINT SHIELD PO   potassium chloride SA 20 MEQ tablet Commonly known as:  K-DUR,KLOR-CON   PROBIOTIC COLON SUPPORT PO   simvastatin 10 MG tablet Commonly  known as:  ZOCOR   Tiotropium Bromide Monohydrate 2.5 MCG/ACT Aers   Vitamin D (Ergocalciferol) 1.25 MG (50000 UT) Caps capsule Commonly known as:  DRISDOL     TAKE these medications   hydrOXYzine 10 MG/5ML syrup Commonly known as:  ATARAX Take 5 mLs (10 mg total) by mouth 3 (three) times daily as needed for itching.   morphine 4 MG/ML injection Inject 1 mL (4 mg total) into the vein every 3 (three) hours as needed (dyspnea).   scopolamine 1 MG/3DAYS Commonly known as:   TRANSDERM-SCOP Place 1 patch (1.5 mg total) onto the skin every 3 (three) days.       Allergies  Allergen Reactions  . Gelatin     Unknown, listed on MAR  . Latex Itching  . Neomycin     Unknown, listed on MAR  . Adhesive [Tape] Rash  . Influenza Vaccines Nausea And Vomiting    Vaccine made her "deathly sick".  She does not take them at all  . Zetia [Ezetimibe] Other (See Comments)    Unknown    Consultations:  None   Procedures/Studies: Dg Chest 2 View  Result Date: 10/11/2018 CLINICAL DATA:  Shortness of breath. EXAM: CHEST - 2 VIEW COMPARISON:  Radiograph of October 08, 2018. FINDINGS: Stable cardiomegaly. Atherosclerosis of thoracic aorta is noted. No pneumothorax is noted. Small bilateral pleural effusions are noted. No consolidative process is noted. Bony thorax is unremarkable. IMPRESSION: Minimal bilateral pleural effusions. No other significant acute abnormality seen. Aortic Atherosclerosis (ICD10-I70.0). Electronically Signed   By: Marijo Conception, M.D.   On: 10/11/2018 13:39   Dg Chest 2 View  Result Date: 10/04/2018 CLINICAL DATA:  Shortness of breath. EXAM: CHEST - 2 VIEW COMPARISON:  None. FINDINGS: Borderline cardiomegaly considering the AP technique. Aortic atherosclerosis. The main pulmonary artery is prominent. Dense calcification in the mitral valve annulus. No infiltrates or effusions. No acute bone abnormality. IMPRESSION: Borderline heart size.  Prominent main pulmonary artery. Aortic Atherosclerosis (ICD10-I70.0). Electronically Signed   By: Lorriane Shire M.D.   On: 10/04/2018 12:48   Dg Knee 1-2 Views Right  Result Date: 10/07/2018 CLINICAL DATA:  Diffuse right knee pain while walking EXAM: RIGHT KNEE - 1-2 VIEW COMPARISON:  None. FINDINGS: Tricompartmental degenerative changes are noted most marked in the medial joint space. No joint effusion is seen. No acute fracture or dislocation is noted. IMPRESSION: Degenerative change without acute abnormality.  Electronically Signed   By: Inez Catalina M.D.   On: 10/07/2018 14:58   US Renal  Result Date: 10/12/2018 CLINICAL DATA:  84 year old female with acute kidney insufficiency. Initial encounter. EXAM: RENAL / URINARY TRACT ULTRASOUND COMPLETE COMPARISON:  None. FINDINGS: Right Kidney: Length: 9.4 cm. Increased echogenicity of renal parenchyma. No hydronephrosis or mass. Left Kidney: Length: 8.9 cm. Difficult to visualize secondary to habitus/bowel gas. Increased echogenicity of renal parenchyma. No obvious hydronephrosis or mass. Bladder: Appears normal for degree of bladder distention. Bilateral pleural effusions. IMPRESSION: 1. Left kidney difficult to visualize secondary to habitus/bowel gas. Taking this limitation into account, no hydronephrosis is noted on either side. 2. Increased renal parenchyma echogenicity may reflect changes of medical renal disease. 3. Bilateral pleural effusions. Electronically Signed   By: Genia Del M.D.   On: 10/12/2018 12:59   Dg Chest Port 1 View  Result Date: 11/03/2018 CLINICAL DATA:  Weakness EXAM: PORTABLE CHEST 1 VIEW COMPARISON:  Chest radiograph 10/11/2018 FINDINGS: Monitoring leads overlie the patient. Stable cardiomegaly. Aortic atherosclerosis. Left-greater-than-right heterogeneous opacities mid and  lower lungs. No definite pleural effusion. IMPRESSION: Cardiomegaly. Left mid and lower lung heterogeneous opacities may represent atelectasis or infection. Electronically Signed   By: Lovey Newcomer M.D.   On: 11/04/2018 13:26   Dg Chest Port 1 View  Result Date: 10/08/2018 CLINICAL DATA:  Shortness of breath. EXAM: PORTABLE CHEST 1 VIEW COMPARISON:  10/04/2018 FINDINGS: Lungs are adequately inflated with mild hazy opacification over the right base which may be due to atelectasis or infection. No effusion. Prominence of the right hilum and prominent main pulmonary artery segment. Cardiomediastinal silhouette and remainder of the exam is unchanged. IMPRESSION: Mild  hazy right base opacification which may be due to atelectasis or infection. Prominent main pulmonary artery segment which may be due to pulmonary arterial hypertension. Prominent right hilum which also may be part of pulmonary arterial hypertension, although mass or adenopathy is possible. Recommend contrast and chest CT on elective basis for further evaluation. Electronically Signed   By: Marin Olp M.D.   On: 10/08/2018 09:56   Vas [place] Lower Extremity Venous (dvt)  Result Date: 10/07/2018  Lower Venous Study Indications: Swelling.  Limitations: Body habitus and poor ultrasound/tissue interface. Performing Technologist: Oliver Hum RVT  Examination Guidelines: A complete evaluation includes B-mode imaging, spectral Doppler, color Doppler, and power Doppler as needed of all accessible portions of each vessel. Bilateral testing is considered an integral part of a complete examination. Limited examinations for reoccurring indications may be performed as noted.  Right Venous Findings: +---------+---------------+---------+-----------+----------+-------+          CompressibilityPhasicitySpontaneityPropertiesSummary +---------+---------------+---------+-----------+----------+-------+ CFV      Full           Yes      Yes                          +---------+---------------+---------+-----------+----------+-------+ SFJ      Full                                                 +---------+---------------+---------+-----------+----------+-------+ FV Prox  Full                                                 +---------+---------------+---------+-----------+----------+-------+ FV Mid   Full                                                 +---------+---------------+---------+-----------+----------+-------+ FV DistalFull                                                 +---------+---------------+---------+-----------+----------+-------+ PFV      Full                                                  +---------+---------------+---------+-----------+----------+-------+ POP      Full  Yes      Yes                          +---------+---------------+---------+-----------+----------+-------+ PTV      Full                                                 +---------+---------------+---------+-----------+----------+-------+ PERO     Full                                                 +---------+---------------+---------+-----------+----------+-------+  Left Venous Findings: +---------+---------------+---------+-----------+----------+-------+          CompressibilityPhasicitySpontaneityPropertiesSummary +---------+---------------+---------+-----------+----------+-------+ CFV      Full           Yes      Yes                          +---------+---------------+---------+-----------+----------+-------+ SFJ      Full                                                 +---------+---------------+---------+-----------+----------+-------+ FV Prox  Full                                                 +---------+---------------+---------+-----------+----------+-------+ FV Mid   Full                                                 +---------+---------------+---------+-----------+----------+-------+ FV DistalFull                                                 +---------+---------------+---------+-----------+----------+-------+ PFV      Full                                                 +---------+---------------+---------+-----------+----------+-------+ POP      Full           Yes      Yes                          +---------+---------------+---------+-----------+----------+-------+ PTV      Full                                                 +---------+---------------+---------+-----------+----------+-------+ PERO     Full                                                  +---------+---------------+---------+-----------+----------+-------+  Summary: Right: There is no evidence of deep vein thrombosis in the lower extremity. No cystic structure found in the popliteal fossa. Left: There is no evidence of deep vein thrombosis in the lower extremity. No cystic structure found in the popliteal fossa.  *See table(s) above for measurements and observations. Electronically signed by Servando Snare MD on 10/07/2018 at 1:09:03 PM.    Final       Subjective: No comoplains  Discharge Exam: Vitals:   10/15/2018 2033 11/13/18 0540  BP: 115/75 (!) 144/78  Pulse: 88 62  Resp: (!) 22 (!) 22  Temp: (!) 97.5 F (36.4 C) (!) 97.4 F (36.3 C)  SpO2: 99% 94%   Vitals:   10/19/2018 1614 11/05/2018 1714 11/12/2018 2033 11/13/18 0540  BP: 138/60 (!) 137/50 115/75 (!) 144/78  Pulse: (!) 39 (!) 40 88 62  Resp: 18 (!) 24 (!) 22 (!) 22  Temp:  98.2 F (36.8 C) (!) 97.5 F (36.4 C) (!) 97.4 F (36.3 C)  TempSrc:  Oral Oral Oral  SpO2: 97% 98% 99% 94%  Weight:      Height:        General: Pt is alert, awake, not in acute distress Cardiovascular: RRR, S1/S2 +, no rubs, no gallops Respiratory: CTA bilaterally, no wheezing, no rhonchi Abdominal: Soft, NT, ND, bowel sounds + Extremities: no edema, no cyanosis    The results of significant diagnostics from this hospitalization (including imaging, microbiology, ancillary and laboratory) are listed below for reference.     Microbiology: No results found for this or any previous visit (from the past 240 hour(s)).   Labs: BNP (last 3 results) Recent Labs    10/11/18 1351 10/12/18 0448 10/19/2018 1252  BNP 2,897.9* 2,455.8* 0,160.1*   Basic Metabolic Panel: Recent Labs  Lab 11/08/2018 1251  NA 136  K >7.5*  CL 109  CO2 16*  GLUCOSE 126*  BUN 118*  CREATININE 6.52*  CALCIUM 10.0  MG 2.9*   Liver Function Tests: No results for input(s): AST, ALT, ALKPHOS, BILITOT, PROT, ALBUMIN in the last 168 hours. No results for  input(s): LIPASE, AMYLASE in the last 168 hours. No results for input(s): AMMONIA in the last 168 hours. CBC: Recent Labs  Lab 10/21/2018 1251  WBC 10.4  NEUTROABS 8.7*  HGB 8.4*  HCT 29.5*  MCV 100.0  PLT 266   Cardiac Enzymes: No results for input(s): CKTOTAL, CKMB, CKMBINDEX, TROPONINI in the last 168 hours. BNP: Invalid input(s): POCBNP CBG: Recent Labs  Lab 10/31/2018 1535  GLUCAP 127*   D-Dimer No results for input(s): DDIMER in the last 72 hours. Hgb A1c No results for input(s): HGBA1C in the last 72 hours. Lipid Profile No results for input(s): CHOL, HDL, LDLCALC, TRIG, CHOLHDL, LDLDIRECT in the last 72 hours. Thyroid function studies No results for input(s): TSH, T4TOTAL, T3FREE, THYROIDAB in the last 72 hours.  Invalid input(s): FREET3 Anemia work up No results for input(s): VITAMINB12, FOLATE, FERRITIN, TIBC, IRON, RETICCTPCT in the last 72 hours. Urinalysis    Component Value Date/Time   COLORURINE YELLOW 10/04/2018 1742   APPEARANCEUR CLEAR 10/04/2018 1742   LABSPEC 1.011 10/04/2018 1742   PHURINE 5.0 10/04/2018 1742   GLUCOSEU NEGATIVE 10/04/2018 1742   HGBUR NEGATIVE 10/04/2018 1742   BILIRUBINUR NEGATIVE 10/04/2018 1742   KETONESUR NEGATIVE 10/04/2018 1742   PROTEINUR 30 (A) 10/04/2018 1742   NITRITE NEGATIVE 10/04/2018 1742   LEUKOCYTESUR NEGATIVE 10/04/2018 1742   Sepsis Labs Invalid input(s): PROCALCITONIN,  WBC,  LACTICIDVEN Microbiology  No results found for this or any previous visit (from the past 240 hour(s)).   Time coordinating discharge: 40 minutes  SIGNED:   Charlynne Cousins, MD  [facility] 11-17-18, 10:26 AM Pager   If 7PM-7AM, please contact night-coverage [email or web address] Password [email or web address]

## 2018-11-14 DEATH — deceased

## 2018-12-27 IMAGING — US US RENAL
1 series · 14 of 25 positions shown · non-contrast
Comparison: None.

CLINICAL DATA: 89-year-old female with acute kidney insufficiency.
Initial encounter.

EXAM:
RENAL / URINARY TRACT ULTRASOUND COMPLETE

[Series 1: us renal · 14 of 43 slices shown]
[im 1/43]
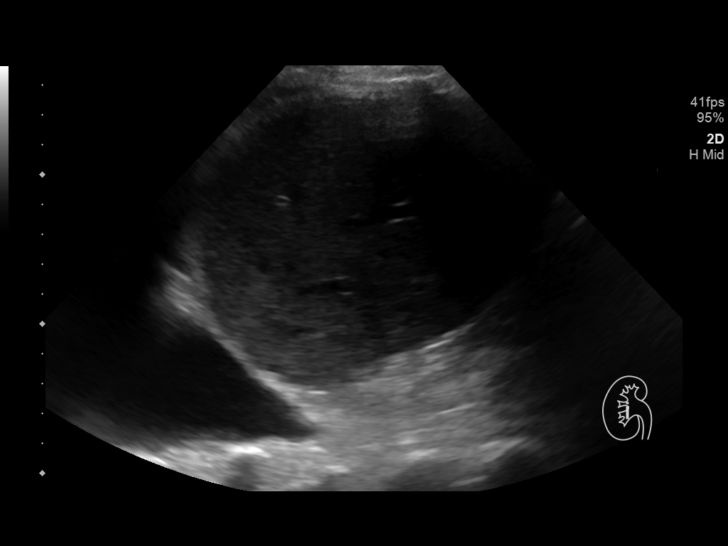
[im 4/43]
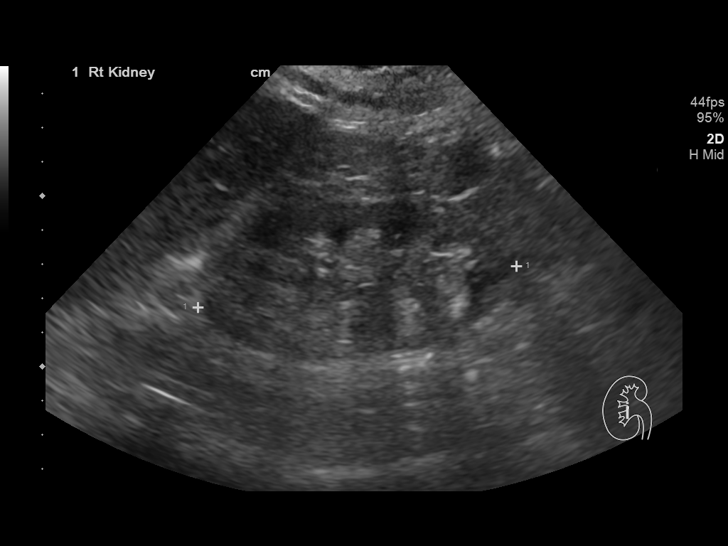
[im 8/43]
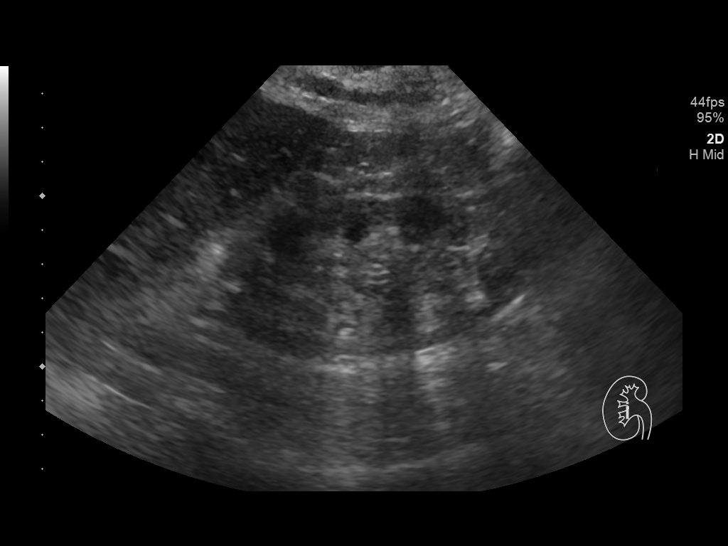
[im 11/43]
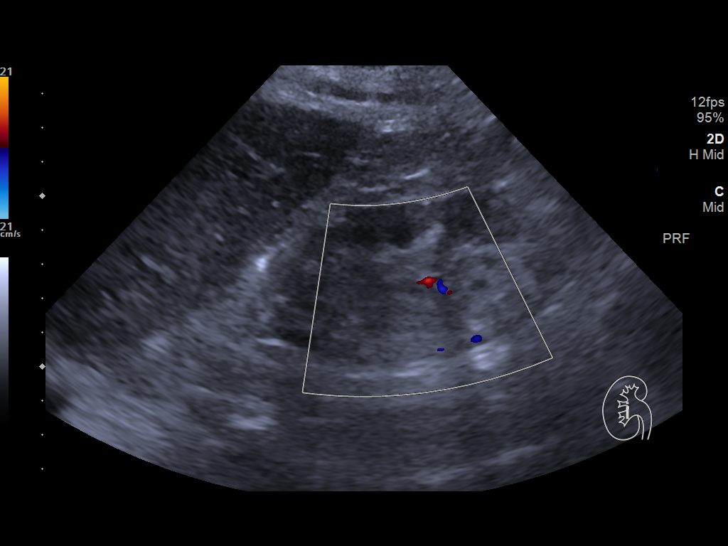
[im 15/43]
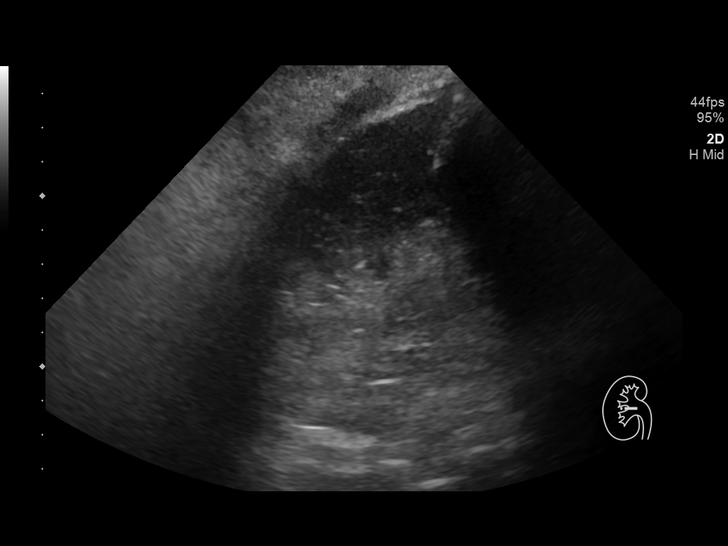
[im 16/43]
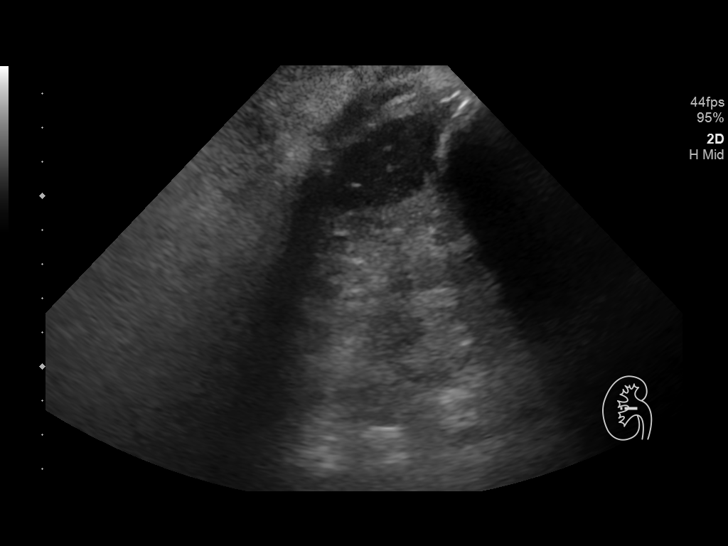
[im 20/43]
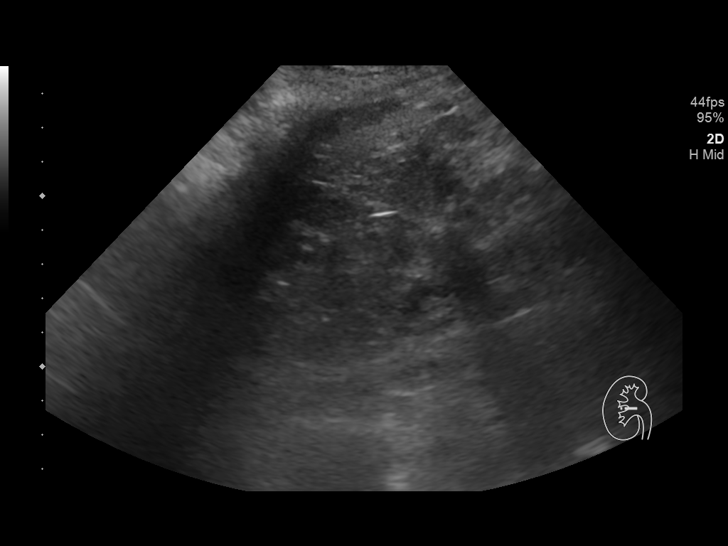
[im 23/43]
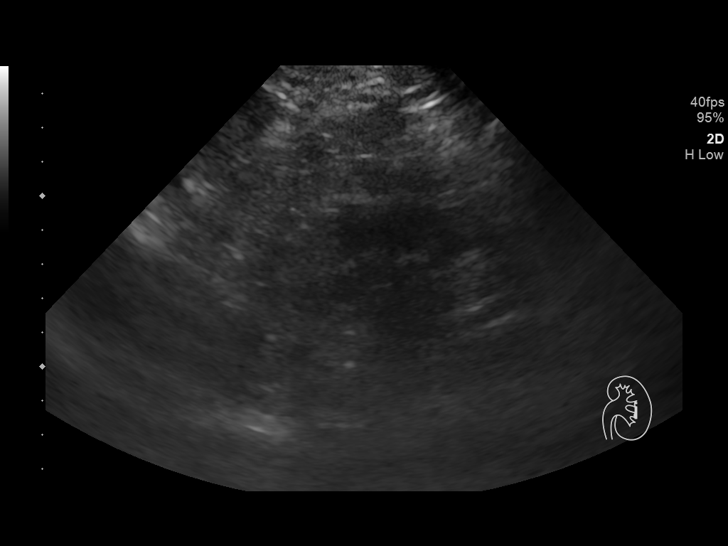
[im 27/43]
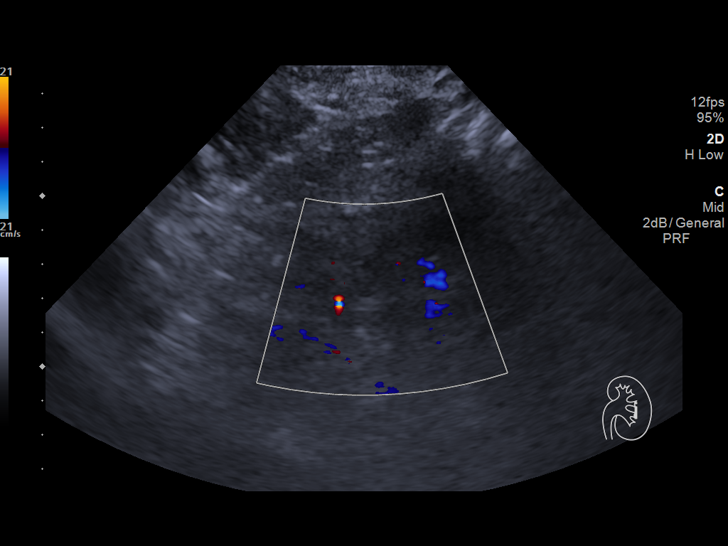
[im 29/43]
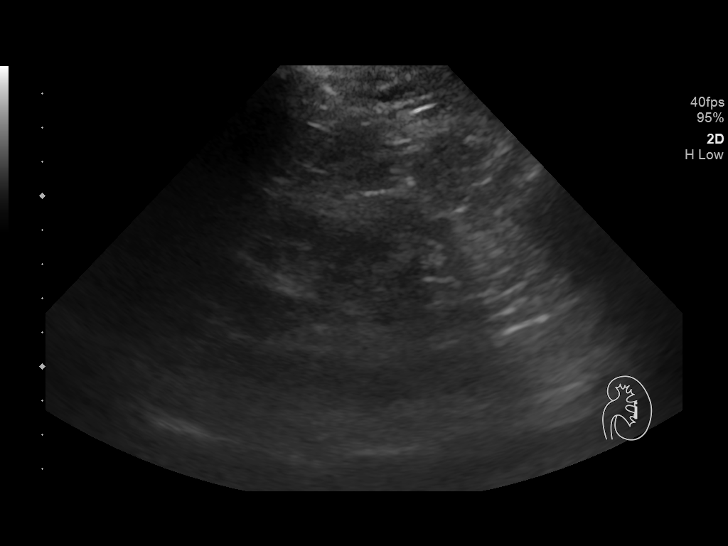
[im 32/43]
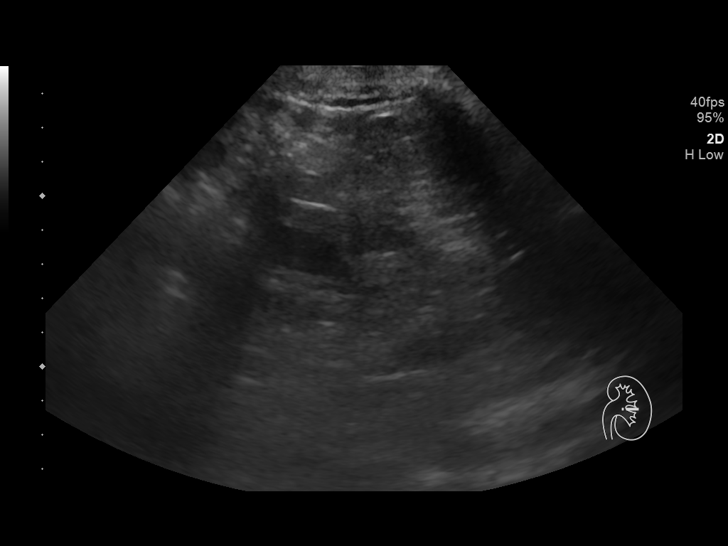
[im 36/43]
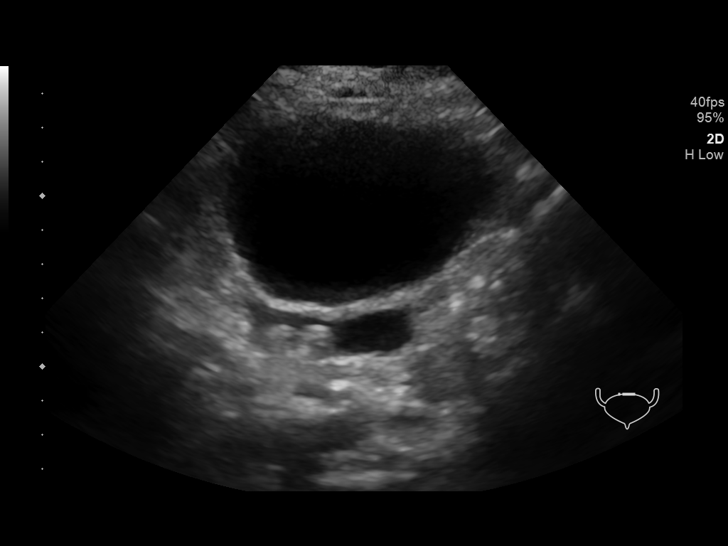
[im 39/43]
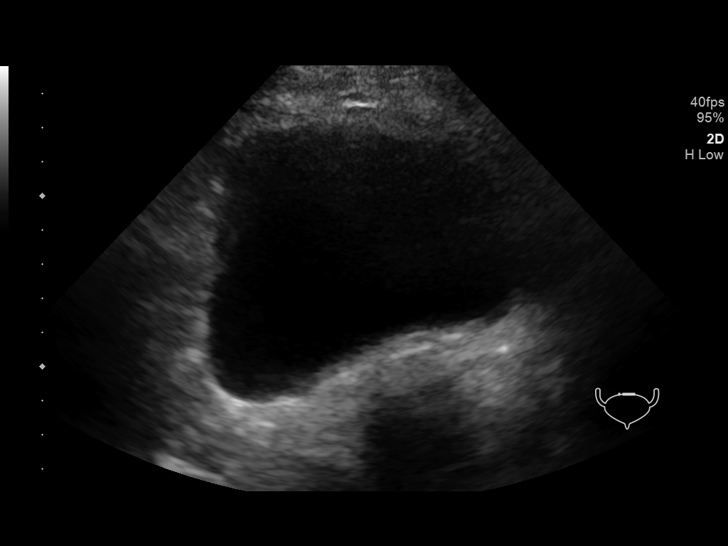
[im 43/43]
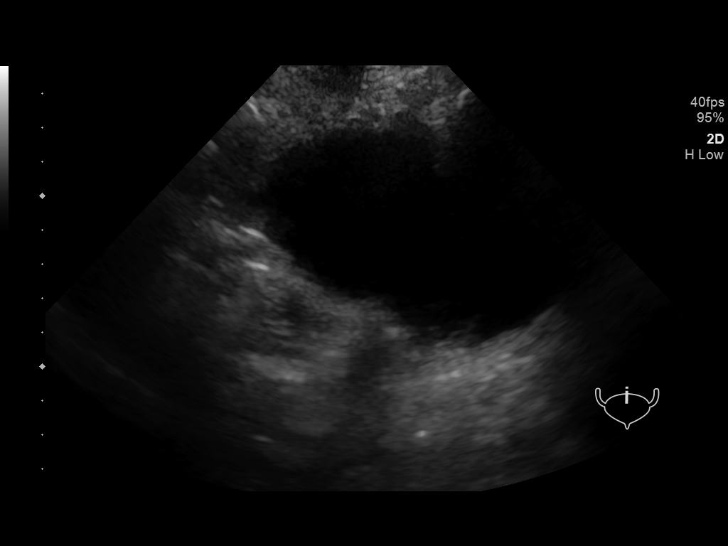

[14 of 25 positions shown; findings below may reference images not displayed]

FINDINGS: Right Kidney:

Length: 9.4 cm. Increased echogenicity of renal parenchyma. No
hydronephrosis or mass.

Left Kidney:

Length: 8.9 cm.. Difficult to visualize secondary to habitus/bowel
gas. Increased echogenicity of renal parenchyma. No obvious
hydronephrosis or mass.

Bladder:

Appears normal for degree of bladder distention.

Bilateral pleural effusions.
IMPRESSION: 1. Left kidney difficult to visualize secondary to habitus/bowel
gas. Taking this limitation into account, no hydronephrosis is noted
on either side.
2. Increased renal parenchyma echogenicity may reflect changes of
medical renal disease.
3. Bilateral pleural effusions.

## 2018-12-28 ENCOUNTER — Ambulatory Visit: Payer: Medicare Other | Admitting: Cardiovascular Disease
